# Patient Record
Sex: Female | Born: 1960 | Race: Black or African American | Hispanic: No | Marital: Married | Smoking: Former smoker
Health system: Southern US, Community
[De-identification: ages and names within clinical notes are randomized; demographics above are authoritative.]

## PROBLEM LIST (undated history)

## (undated) DIAGNOSIS — B370 Candidal stomatitis: Secondary | ICD-10-CM

## (undated) DIAGNOSIS — T7840XA Allergy, unspecified, initial encounter: Secondary | ICD-10-CM

## (undated) DIAGNOSIS — E119 Type 2 diabetes mellitus without complications: Secondary | ICD-10-CM

## (undated) DIAGNOSIS — K5090 Crohn's disease, unspecified, without complications: Secondary | ICD-10-CM

## (undated) DIAGNOSIS — R8789 Other abnormal findings in specimens from female genital organs: Secondary | ICD-10-CM

## (undated) DIAGNOSIS — D649 Anemia, unspecified: Secondary | ICD-10-CM

## (undated) DIAGNOSIS — K529 Noninfective gastroenteritis and colitis, unspecified: Secondary | ICD-10-CM

## (undated) HISTORY — PX: COLONOSCOPY: SHX174

## (undated) HISTORY — DX: Anemia, unspecified: D64.9

## (undated) HISTORY — PX: CHOLECYSTECTOMY: SHX55

## (undated) HISTORY — DX: Noninfective gastroenteritis and colitis, unspecified: K52.9

## (undated) HISTORY — DX: Allergy, unspecified, initial encounter: T78.40XA

---

## 1997-06-15 ENCOUNTER — Encounter: Admission: RE | Admit: 1997-06-15 | Discharge: 1997-06-15 | Payer: Self-pay | Admitting: Family Medicine

## 1997-06-27 ENCOUNTER — Encounter: Admission: RE | Admit: 1997-06-27 | Discharge: 1997-06-27 | Payer: Self-pay | Admitting: Family Medicine

## 1997-07-19 ENCOUNTER — Ambulatory Visit (HOSPITAL_COMMUNITY): Admission: RE | Admit: 1997-07-19 | Discharge: 1997-07-19 | Payer: Self-pay | Admitting: *Deleted

## 1997-07-26 ENCOUNTER — Encounter: Admission: RE | Admit: 1997-07-26 | Discharge: 1997-07-26 | Payer: Self-pay | Admitting: Sports Medicine

## 1997-08-16 ENCOUNTER — Encounter: Admission: RE | Admit: 1997-08-16 | Discharge: 1997-08-16 | Payer: Self-pay | Admitting: Family Medicine

## 1997-08-28 ENCOUNTER — Emergency Department (HOSPITAL_COMMUNITY): Admission: EM | Admit: 1997-08-28 | Discharge: 1997-08-28 | Payer: Self-pay | Admitting: Emergency Medicine

## 1997-08-30 ENCOUNTER — Encounter: Admission: RE | Admit: 1997-08-30 | Discharge: 1997-08-30 | Payer: Self-pay | Admitting: Family Medicine

## 1997-09-01 ENCOUNTER — Encounter: Admission: RE | Admit: 1997-09-01 | Discharge: 1997-09-01 | Payer: Self-pay | Admitting: Family Medicine

## 1997-09-29 ENCOUNTER — Encounter: Admission: RE | Admit: 1997-09-29 | Discharge: 1997-09-29 | Payer: Self-pay | Admitting: Family Medicine

## 1997-09-30 ENCOUNTER — Encounter: Admission: RE | Admit: 1997-09-30 | Discharge: 1997-09-30 | Payer: Self-pay | Admitting: Family Medicine

## 1997-10-08 ENCOUNTER — Inpatient Hospital Stay (HOSPITAL_COMMUNITY): Admission: AD | Admit: 1997-10-08 | Discharge: 1997-10-08 | Payer: Self-pay | Admitting: *Deleted

## 1997-10-12 ENCOUNTER — Encounter: Admission: RE | Admit: 1997-10-12 | Discharge: 1998-01-10 | Payer: Self-pay | Admitting: *Deleted

## 1997-10-18 ENCOUNTER — Encounter: Admission: RE | Admit: 1997-10-18 | Discharge: 1997-10-18 | Payer: Self-pay | Admitting: Family Medicine

## 1997-11-14 ENCOUNTER — Encounter: Admission: RE | Admit: 1997-11-14 | Discharge: 1997-11-14 | Payer: Self-pay | Admitting: Family Medicine

## 1997-11-18 ENCOUNTER — Encounter: Admission: RE | Admit: 1997-11-18 | Discharge: 1997-11-18 | Payer: Self-pay | Admitting: Family Medicine

## 1997-11-29 ENCOUNTER — Encounter: Admission: RE | Admit: 1997-11-29 | Discharge: 1997-11-29 | Payer: Self-pay | Admitting: Family Medicine

## 1997-11-29 ENCOUNTER — Ambulatory Visit (HOSPITAL_COMMUNITY): Admission: RE | Admit: 1997-11-29 | Discharge: 1997-11-29 | Payer: Self-pay | Admitting: Obstetrics

## 1997-12-06 ENCOUNTER — Encounter: Admission: RE | Admit: 1997-12-06 | Discharge: 1997-12-06 | Payer: Self-pay | Admitting: Family Medicine

## 1997-12-06 ENCOUNTER — Encounter (HOSPITAL_COMMUNITY): Admission: RE | Admit: 1997-12-06 | Discharge: 1997-12-26 | Payer: Self-pay | Admitting: *Deleted

## 1997-12-08 ENCOUNTER — Inpatient Hospital Stay (HOSPITAL_COMMUNITY): Admission: AD | Admit: 1997-12-08 | Discharge: 1997-12-08 | Payer: Self-pay | Admitting: Obstetrics & Gynecology

## 1997-12-09 ENCOUNTER — Encounter: Admission: RE | Admit: 1997-12-09 | Discharge: 1997-12-09 | Payer: Self-pay | Admitting: Family Medicine

## 1997-12-16 ENCOUNTER — Encounter: Admission: RE | Admit: 1997-12-16 | Discharge: 1997-12-16 | Payer: Self-pay | Admitting: Family Medicine

## 1997-12-20 ENCOUNTER — Encounter: Admission: RE | Admit: 1997-12-20 | Discharge: 1997-12-20 | Payer: Self-pay | Admitting: Family Medicine

## 1997-12-23 ENCOUNTER — Inpatient Hospital Stay (HOSPITAL_COMMUNITY): Admission: AD | Admit: 1997-12-23 | Discharge: 1997-12-27 | Payer: Self-pay | Admitting: Obstetrics & Gynecology

## 1997-12-26 ENCOUNTER — Encounter: Admission: RE | Admit: 1997-12-26 | Discharge: 1997-12-26 | Payer: Self-pay | Admitting: Family Medicine

## 1998-01-12 ENCOUNTER — Encounter: Admission: RE | Admit: 1998-01-12 | Discharge: 1998-01-12 | Payer: Self-pay | Admitting: Family Medicine

## 1998-02-23 ENCOUNTER — Encounter: Admission: RE | Admit: 1998-02-23 | Discharge: 1998-02-23 | Payer: Self-pay | Admitting: Family Medicine

## 1998-03-07 ENCOUNTER — Encounter: Admission: RE | Admit: 1998-03-07 | Discharge: 1998-03-07 | Payer: Self-pay | Admitting: Family Medicine

## 1998-03-28 ENCOUNTER — Encounter: Admission: RE | Admit: 1998-03-28 | Discharge: 1998-03-28 | Payer: Self-pay | Admitting: Family Medicine

## 1998-04-03 ENCOUNTER — Encounter: Admission: RE | Admit: 1998-04-03 | Discharge: 1998-04-03 | Payer: Self-pay | Admitting: Family Medicine

## 1998-07-10 ENCOUNTER — Encounter: Admission: RE | Admit: 1998-07-10 | Discharge: 1998-07-10 | Payer: Self-pay | Admitting: Family Medicine

## 1998-07-13 ENCOUNTER — Encounter: Admission: RE | Admit: 1998-07-13 | Discharge: 1998-07-13 | Payer: Self-pay | Admitting: Family Medicine

## 1998-07-20 ENCOUNTER — Encounter: Admission: RE | Admit: 1998-07-20 | Discharge: 1998-07-20 | Payer: Self-pay | Admitting: Family Medicine

## 1998-10-16 ENCOUNTER — Encounter: Admission: RE | Admit: 1998-10-16 | Discharge: 1998-10-16 | Payer: Self-pay | Admitting: Family Medicine

## 1999-04-02 ENCOUNTER — Encounter: Admission: RE | Admit: 1999-04-02 | Discharge: 1999-04-02 | Payer: Self-pay | Admitting: Family Medicine

## 1999-05-07 ENCOUNTER — Encounter: Admission: RE | Admit: 1999-05-07 | Discharge: 1999-05-07 | Payer: Self-pay | Admitting: Family Medicine

## 2000-05-01 ENCOUNTER — Encounter: Admission: RE | Admit: 2000-05-01 | Discharge: 2000-05-01 | Payer: Self-pay | Admitting: Family Medicine

## 2002-03-17 ENCOUNTER — Encounter: Admission: RE | Admit: 2002-03-17 | Discharge: 2002-03-17 | Payer: Self-pay | Admitting: Family Medicine

## 2002-04-07 ENCOUNTER — Encounter: Payer: Self-pay | Admitting: *Deleted

## 2002-04-07 ENCOUNTER — Encounter: Admission: RE | Admit: 2002-04-07 | Discharge: 2002-04-07 | Payer: Self-pay | Admitting: *Deleted

## 2002-04-20 ENCOUNTER — Encounter: Admission: RE | Admit: 2002-04-20 | Discharge: 2002-04-20 | Payer: Self-pay | Admitting: Sports Medicine

## 2002-11-05 ENCOUNTER — Emergency Department (HOSPITAL_COMMUNITY): Admission: EM | Admit: 2002-11-05 | Discharge: 2002-11-05 | Payer: Self-pay | Admitting: Emergency Medicine

## 2002-11-09 ENCOUNTER — Encounter: Admission: RE | Admit: 2002-11-09 | Discharge: 2002-11-09 | Payer: Self-pay | Admitting: Sports Medicine

## 2003-06-27 ENCOUNTER — Emergency Department (HOSPITAL_COMMUNITY): Admission: EM | Admit: 2003-06-27 | Discharge: 2003-06-28 | Payer: Self-pay | Admitting: Emergency Medicine

## 2004-06-15 ENCOUNTER — Ambulatory Visit: Payer: Self-pay | Admitting: Family Medicine

## 2004-06-15 ENCOUNTER — Encounter (INDEPENDENT_AMBULATORY_CARE_PROVIDER_SITE_OTHER): Payer: Self-pay | Admitting: Specialist

## 2004-08-07 ENCOUNTER — Ambulatory Visit: Payer: Self-pay | Admitting: Family Medicine

## 2004-08-16 ENCOUNTER — Ambulatory Visit: Payer: Self-pay | Admitting: Family Medicine

## 2004-11-25 ENCOUNTER — Emergency Department (HOSPITAL_COMMUNITY): Admission: EM | Admit: 2004-11-25 | Discharge: 2004-11-25 | Payer: Self-pay | Admitting: Emergency Medicine

## 2005-02-01 ENCOUNTER — Ambulatory Visit: Payer: Self-pay | Admitting: Family Medicine

## 2005-08-28 ENCOUNTER — Encounter (INDEPENDENT_AMBULATORY_CARE_PROVIDER_SITE_OTHER): Payer: Self-pay | Admitting: *Deleted

## 2005-09-13 ENCOUNTER — Ambulatory Visit: Payer: Self-pay | Admitting: Family Medicine

## 2005-09-25 ENCOUNTER — Encounter: Admission: RE | Admit: 2005-09-25 | Discharge: 2005-09-25 | Payer: Self-pay | Admitting: Sports Medicine

## 2006-03-18 ENCOUNTER — Ambulatory Visit: Payer: Self-pay | Admitting: Family Medicine

## 2006-03-27 DIAGNOSIS — F329 Major depressive disorder, single episode, unspecified: Secondary | ICD-10-CM

## 2006-03-27 DIAGNOSIS — F411 Generalized anxiety disorder: Secondary | ICD-10-CM | POA: Insufficient documentation

## 2006-03-27 DIAGNOSIS — F17200 Nicotine dependence, unspecified, uncomplicated: Secondary | ICD-10-CM

## 2006-03-28 ENCOUNTER — Encounter (INDEPENDENT_AMBULATORY_CARE_PROVIDER_SITE_OTHER): Payer: Self-pay | Admitting: *Deleted

## 2006-07-03 ENCOUNTER — Telehealth: Payer: Self-pay | Admitting: *Deleted

## 2006-09-18 ENCOUNTER — Encounter (INDEPENDENT_AMBULATORY_CARE_PROVIDER_SITE_OTHER): Payer: Self-pay | Admitting: Family Medicine

## 2006-09-18 ENCOUNTER — Ambulatory Visit: Payer: Self-pay | Admitting: Family Medicine

## 2006-09-18 ENCOUNTER — Encounter: Payer: Self-pay | Admitting: Family Medicine

## 2006-09-18 DIAGNOSIS — F1010 Alcohol abuse, uncomplicated: Secondary | ICD-10-CM

## 2006-09-18 LAB — CONVERTED CEMR LAB
AST: 20 units/L (ref 0–37)
Alkaline Phosphatase: 93 units/L (ref 39–117)
BUN: 13 mg/dL (ref 6–23)
Calcium: 10.6 mg/dL — ABNORMAL HIGH (ref 8.4–10.5)
Chloride: 106 meq/L (ref 96–112)
Creatinine, Ser: 0.81 mg/dL (ref 0.40–1.20)
HCT: 35.7 % — ABNORMAL LOW (ref 36.0–46.0)
Hemoglobin: 10.7 g/dL — ABNORMAL LOW (ref 12.0–15.0)
MCHC: 30 g/dL (ref 30.0–36.0)
MCV: 81 fL (ref 78.0–100.0)
RDW: 18.2 % — ABNORMAL HIGH (ref 11.5–14.0)
Total Bilirubin: 0.4 mg/dL (ref 0.3–1.2)

## 2006-09-22 ENCOUNTER — Encounter: Payer: Self-pay | Admitting: Family Medicine

## 2006-10-16 ENCOUNTER — Encounter: Admission: RE | Admit: 2006-10-16 | Discharge: 2006-10-16 | Payer: Self-pay | Admitting: Sports Medicine

## 2006-10-17 ENCOUNTER — Encounter: Payer: Self-pay | Admitting: Family Medicine

## 2007-02-11 ENCOUNTER — Ambulatory Visit: Payer: Self-pay | Admitting: Family Medicine

## 2007-02-11 ENCOUNTER — Encounter: Payer: Self-pay | Admitting: Family Medicine

## 2007-02-11 DIAGNOSIS — R8789 Other abnormal findings in specimens from female genital organs: Secondary | ICD-10-CM | POA: Insufficient documentation

## 2007-02-11 DIAGNOSIS — D649 Anemia, unspecified: Secondary | ICD-10-CM | POA: Insufficient documentation

## 2007-02-11 HISTORY — DX: Other abnormal findings in specimens from female genital organs: R87.89

## 2007-02-11 LAB — CONVERTED CEMR LAB
Ferritin: 8 ng/mL — ABNORMAL LOW (ref 10–291)
Hemoglobin: 11.7 g/dL — ABNORMAL LOW (ref 12.0–15.0)
RDW: 17.7 % — ABNORMAL HIGH (ref 11.5–15.5)
Saturation Ratios: 3 % — ABNORMAL LOW (ref 20–55)
TIBC: 472 ug/dL — ABNORMAL HIGH (ref 250–470)
Vitamin B-12: 583 pg/mL (ref 211–911)

## 2007-02-16 ENCOUNTER — Encounter: Payer: Self-pay | Admitting: Family Medicine

## 2007-02-16 ENCOUNTER — Telehealth: Payer: Self-pay | Admitting: *Deleted

## 2007-06-23 ENCOUNTER — Ambulatory Visit: Payer: Self-pay | Admitting: Family Medicine

## 2007-06-23 DIAGNOSIS — N898 Other specified noninflammatory disorders of vagina: Secondary | ICD-10-CM | POA: Insufficient documentation

## 2007-06-25 ENCOUNTER — Encounter: Payer: Self-pay | Admitting: Family Medicine

## 2007-09-30 ENCOUNTER — Telehealth: Payer: Self-pay | Admitting: *Deleted

## 2009-02-07 ENCOUNTER — Encounter (INDEPENDENT_AMBULATORY_CARE_PROVIDER_SITE_OTHER): Payer: Self-pay | Admitting: *Deleted

## 2009-02-07 ENCOUNTER — Emergency Department (HOSPITAL_COMMUNITY): Admission: EM | Admit: 2009-02-07 | Discharge: 2009-02-07 | Payer: Self-pay | Admitting: Emergency Medicine

## 2009-02-08 ENCOUNTER — Telehealth: Payer: Self-pay | Admitting: Gastroenterology

## 2009-02-09 ENCOUNTER — Ambulatory Visit: Payer: Self-pay | Admitting: Gastroenterology

## 2009-02-09 ENCOUNTER — Encounter: Payer: Self-pay | Admitting: Gastroenterology

## 2009-02-09 ENCOUNTER — Inpatient Hospital Stay (HOSPITAL_COMMUNITY): Admission: AD | Admit: 2009-02-09 | Discharge: 2009-02-19 | Payer: Self-pay | Admitting: Gastroenterology

## 2009-02-10 ENCOUNTER — Encounter: Payer: Self-pay | Admitting: Internal Medicine

## 2009-02-17 ENCOUNTER — Encounter: Payer: Self-pay | Admitting: Gastroenterology

## 2009-02-20 ENCOUNTER — Telehealth: Payer: Self-pay | Admitting: Gastroenterology

## 2009-02-20 ENCOUNTER — Encounter: Payer: Self-pay | Admitting: Gastroenterology

## 2009-02-21 ENCOUNTER — Telehealth: Payer: Self-pay | Admitting: Gastroenterology

## 2009-02-24 ENCOUNTER — Telehealth: Payer: Self-pay | Admitting: Gastroenterology

## 2009-03-06 ENCOUNTER — Ambulatory Visit: Payer: Self-pay | Admitting: Gastroenterology

## 2009-03-06 ENCOUNTER — Encounter: Payer: Self-pay | Admitting: Gastroenterology

## 2009-03-06 DIAGNOSIS — K5090 Crohn's disease, unspecified, without complications: Secondary | ICD-10-CM | POA: Insufficient documentation

## 2009-03-10 ENCOUNTER — Telehealth: Payer: Self-pay | Admitting: Gastroenterology

## 2009-03-10 ENCOUNTER — Encounter: Payer: Self-pay | Admitting: Gastroenterology

## 2009-04-04 ENCOUNTER — Ambulatory Visit: Payer: Self-pay | Admitting: Gastroenterology

## 2009-04-04 DIAGNOSIS — B373 Candidiasis of vulva and vagina: Secondary | ICD-10-CM

## 2009-04-18 ENCOUNTER — Ambulatory Visit: Payer: Self-pay | Admitting: Gastroenterology

## 2009-04-18 LAB — CONVERTED CEMR LAB
AST: 20 units/L (ref 0–37)
Alkaline Phosphatase: 94 units/L (ref 39–117)
Basophils Absolute: 0 10*3/uL (ref 0.0–0.1)
Bilirubin, Direct: 0 mg/dL (ref 0.0–0.3)
Hemoglobin: 12.3 g/dL (ref 12.0–15.0)
Lymphocytes Relative: 35.4 % (ref 12.0–46.0)
Monocytes Relative: 7 % (ref 3.0–12.0)
Neutro Abs: 5.8 10*3/uL (ref 1.4–7.7)
Platelets: 519 10*3/uL — ABNORMAL HIGH (ref 150.0–400.0)
RDW: 15.3 % — ABNORMAL HIGH (ref 11.5–14.6)
Total Bilirubin: 0.7 mg/dL (ref 0.3–1.2)

## 2009-05-31 ENCOUNTER — Ambulatory Visit: Payer: Self-pay | Admitting: Gastroenterology

## 2009-06-20 ENCOUNTER — Emergency Department (HOSPITAL_COMMUNITY): Admission: EM | Admit: 2009-06-20 | Discharge: 2009-06-20 | Payer: Self-pay | Admitting: Emergency Medicine

## 2009-08-02 ENCOUNTER — Telehealth: Payer: Self-pay | Admitting: Gastroenterology

## 2009-08-04 ENCOUNTER — Ambulatory Visit: Payer: Self-pay | Admitting: Gastroenterology

## 2009-08-04 LAB — CONVERTED CEMR LAB
ALT: 26 units/L (ref 0–35)
Basophils Absolute: 0 10*3/uL (ref 0.0–0.1)
HCT: 42.3 % (ref 36.0–46.0)
Lymphs Abs: 4 10*3/uL (ref 0.7–4.0)
MCHC: 33.7 g/dL (ref 30.0–36.0)
MCV: 91 fL (ref 78.0–100.0)
Monocytes Absolute: 0.7 10*3/uL (ref 0.1–1.0)
Platelets: 453 10*3/uL — ABNORMAL HIGH (ref 150.0–400.0)
RDW: 13.9 % (ref 11.5–14.6)
Total Bilirubin: 1 mg/dL (ref 0.3–1.2)

## 2009-12-20 ENCOUNTER — Telehealth: Payer: Self-pay | Admitting: Gastroenterology

## 2009-12-25 ENCOUNTER — Telehealth: Payer: Self-pay | Admitting: Gastroenterology

## 2010-02-07 ENCOUNTER — Ambulatory Visit
Admission: RE | Admit: 2010-02-07 | Discharge: 2010-02-07 | Payer: Self-pay | Source: Home / Self Care | Attending: Gastroenterology | Admitting: Gastroenterology

## 2010-02-07 ENCOUNTER — Other Ambulatory Visit: Payer: Self-pay | Admitting: Gastroenterology

## 2010-02-07 LAB — URINALYSIS, ROUTINE W REFLEX MICROSCOPIC
Bilirubin Urine: NEGATIVE
Ketones, ur: NEGATIVE
Nitrite: NEGATIVE
Specific Gravity, Urine: >=1.03 (ref 1.000–1.030)
Total Protein, Urine: NEGATIVE
Urine Glucose: NEGATIVE
Urobilinogen, UA: 0.2 (ref 0.0–1.0)
pH: 6 (ref 5.0–8.0)

## 2010-02-20 ENCOUNTER — Telehealth: Payer: Self-pay | Admitting: Gastroenterology

## 2010-02-27 NOTE — Progress Notes (Signed)
Summary: Made office appointment  Phone Note Outgoing Call Call back at Home Phone [phone]   Call placed by: Merri Ray CMA Duncan Dull),  December 25, 2009 8:26 AM Summary of Call: Called to inform pt that refills were sent in to her pharmacy and she has a follow up office appointment with Dr Arlyce Dice on 02/07/2009 at 11am. To follow up for her medications Initial call taken by: Merri Ray CMA Duncan Dull),  December 25, 2009 8:27 AM

## 2010-02-27 NOTE — Procedures (Signed)
Summary: Flexible Sigmoidoscopy  Patient: Elizabeth Andrade Note: All result statuses are Final unless otherwise noted.  Tests: (1) Flexible Sigmoidoscopy (FLX)  FLX Flexible Sigmoidoscopy                             DONE     [facility]     [address]     [place], [place]  [address]           FLEXIBLE SIGMOIDOSCOPY PROCEDURE REPORT           PATIENT:  Elizabeth Andrade, Elizabeth Andrade  MR#:  [id number]     BIRTHDATE:  1961/01/04, 48 yrs. old  GENDER:  female           ENDOSCOPIST:  Wilhemina Bonito. Eda Keys, MD     Referred by:  Barbette Hair. Arlyce Dice, M.D.           PROCEDURE DATE:  02/10/2009     PROCEDURE:  Flexible Sigmoidoscopy, diagnostic     ASA CLASS:  Class II     INDICATIONS:  abnormal imaging           MEDICATIONS:   Fentanyl 100 mcg IV, Versed 10 mg IV           DESCRIPTION OF PROCEDURE:   After the risks benefits and     alternatives of the procedure were thoroughly explained, informed     consent was obtained.  Digital rectal exam was performed and     revealed no abnormalities.   The [id number] ([id number]) endoscope was     introduced through the anus and advanced to the sigmoid colon,     limited by an obstruction.   The quality of the prep was     excellent.  The instrument was then slowly withdrawn as the mucosa     was fully examined.     <<PROCEDUREIMAGES>>           There was stensosis found AT THE DISTAL SIGMOID COLON (EDEMA). THE     DISTAL MUCOSA WAS NORMAL  Retroflexed views in the rectum revealed     no abnormalities.    The scope was then withdrawn from the patient     and the procedure terminated.           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 Eda Keys, MD           CC:  Melvia Heaps, MD           n.     Rosalie Doctor:   Wilhemina Bonito. Eda Keys at 02/10/2009 03:05 PM        Jacques Navy, [id number]  Note: An exclamation mark (!) indicates a result that was not dispersed into the flowsheet. Document Creation Date: 02/10/2009 3:05 PM _______________________________________________________________________  (1) Order result status: Final Collection or observation date-time: 02/10/2009 14:57 Requested date-time:  Receipt date-time:  Reported date-time:  Referring Physician:   Ordering Physician: Fransico Setters [phone]) Specimen Source:  Source: Launa Grill Order Number: [id number] Lab site:

## 2010-02-27 NOTE — Progress Notes (Signed)
  Phone Note Call from Patient   Summary of Call: she called asking for refills on lialda and azathiaprine, both of which were already sent in by office earlier today.  she no showed for last appt and has not been seen by Dr. Arlyce Dice since spring.  I will forward a copy of this to scheduling so that she is set up with ROV in next few weeks. Initial call taken by: Rachael Fee MD,  December 20, 2009 3:50 PM

## 2010-02-27 NOTE — Progress Notes (Signed)
Summary: Release to work  request  Phone Note Call from Patient Call back at [facility] Phone [phone]   Caller: Patient Call For: Dr. Arlyce Dice Reason for Call: Talk to Nurse Summary of Call: would like a note from Dr. Arlyce Dice stating that pt is able to return to work with no restrictions and can return on the 14th... [facility] [employer] fax #[phone] Initial call taken by: Vallarie Mare,  March 10, 2009 2:06 PM  Follow-up for Phone Call        Note faxed per pt. request. I left a message to let her know and to callback if she needs anything else. Follow-up by: Laureen Ochs LPN,  March 10, 2009 4:36 PM

## 2010-02-27 NOTE — Progress Notes (Signed)
Summary: triage  Phone Note Call from Patient Call back at Home Phone [phone]   Caller: Mom Call For: Arlyce Dice Reason for Call: Talk to Nurse Summary of Call: Patient states that she feels that her stomach is swollen inside wants to know if that's part of the healing process Initial call taken by: Tawni Levy,  February 24, 2009 3:49 PM  Follow-up for Phone Call        Pt. states she has a "drawing in mild pain " in her abd. Denies fever, constipation, diarrhea, n/v, blood, black stools.   1) Soft,bland diet. No spicy,greasy,fried foods. Advance diet as tolerated. 2) tylenol/Ibuprofen as needed 3) Heating pad to abdomen as needed. 4) Pt. to keep scheduled office visit on 03-06-09 5) If symptoms become worse call back immediately or go to ER.  Follow-up by: Laureen Ochs LPN,  February 24, 2009 4:43 PM
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
## 2010-02-27 NOTE — Letter (Signed)
Summary: Appt Reminder 2  [facility]  [address] [place], [place] [address]   Phone: [phone]  Fax: [phone]        February 20, 2009 MRN: [id number]    Elizabeth Andrade [address] [place], [place]  [address]    Dear Elizabeth Andrade,   You have a return appointment with Dr.Robert Arlyce Dice on 03-06-09 at 9:30am. Please remember to bring a complete list of the medicines you are taking, your insurance card and your co-pay.  If you have to cancel or reschedule this appointment, please call before 5:00 pm the evening before to avoid a cancellation fee.  If you have any questions or concerns, please call [phone].    Sincerely,    Laureen Ochs LPN  Appended Document: Appt Reminder 2 Letter mailed to patient.
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
## 2010-02-27 NOTE — Progress Notes (Signed)
Summary: TRIAGE  Phone Note Call from Patient Call back at Home Phone [phone]   Caller: Patient Call For: Dr. Arlyce Dice Reason for Call: Talk to Nurse Summary of Call:  Can she take vitamins for Inflamation, with her other medication. Initial call taken by: Karna Christmas,  February 21, 2009 4:47 PM  Follow-up for Phone Call        DR.KAPLAN--Per pt. mother-Can pt. take an OTC  supplement called Zyflamend?   Follow-up by: Laureen Ochs LPN,  February 21, 2009 4:57 PM  Additional Follow-up for Phone Call Additional follow up Details #1::        ok Additional Follow-up by: Louis Meckel MD,  February 22, 2009 10:02 AM    Additional Follow-up for Phone Call Additional follow up Details #2::    Above MD orders reviewed with patient's mother. Pt. to keep scheduled office visit. Pt. instructed to call back as needed.   Follow-up by: Laureen Ochs LPN,  February 22, 2009 10:44 AM
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
## 2010-02-27 NOTE — Progress Notes (Signed)
Summary: triage  Phone Note Call from Patient Call back at [phone] or [phone]   Caller: Patient Call For: Arlyce Dice Reason for Call: Talk to Nurse Summary of Call: Patient was seen last night in the E.R and was told to see a GI doc this week because she was dx with Crohn's. Pt wantsto see Dr Arlyce Dice advised her that his firt available is in Feb. but she wants to see him today or tom. Initial call taken by: Tawni Levy,  February 08, 2009 10:23 AM  Follow-up for Phone Call        Pt. will see Dr.Kaplan on 02-09-09 at 8:30am.  Follow-up by: Laureen Ochs LPN,  February 08, 2009 10:56 AM

## 2010-02-27 NOTE — Letter (Signed)
Summary: Generic Letter  [facility]  [address] [place], [place] [address]   Phone: [phone]  Fax: [phone]    02/09/2009  Elizabeth Andrade [address] [place], [place]  [address]  Dear [name],  It is my pleasure to have treated you recently as a new patient in my office. I appreciate your confidence and the opportunity to participate in your care.  Since I do have a busy inpatient endoscopy schedule and office schedule, my office hours vary weekly. I am, however, available for emergency calls everyday through my office. If I am not available for an urgent office appointment, another one of our gastroenterologist will be able to assist you.  My well-trained staff are prepared to help you at all times. For emergencies after office hours, a physician from our Gastroenterology section is always available through my 24 hour answering service  Once again I welcome you as a new patient and I look forward to a happy and healthy relationship               Sincerely,   Melvia Heaps MD  Appended Document: Generic Letter letter mailed

## 2010-02-27 NOTE — Procedures (Signed)
Summary: Colonoscopy  Patient: Elizabeth Andrade Note: All result statuses are Final unless otherwise noted.  Tests: (1) Colonoscopy (COL)   COL Colonoscopy           DONE      [facility]     [address]     [place], [place]  [address]           COLONOSCOPY PROCEDURE REPORT           PATIENT:  [name], [name]  MR#:  [id number]     BIRTHDATE:  Jan 18, 1961, 48 yrs. old  GENDER:  female           ENDOSCOPIST:  Barbette Hair. Arlyce Dice, MD     Referred by:           PROCEDURE DATE:  02/17/2009     PROCEDURE:  Colonoscopy with biopsy     ASA CLASS:  Class II     INDICATIONS:  Crohn's disease           MEDICATIONS:   Fentanyl 75 mcg IV, Versed 8 mg IV, Benadryl 25 mg     IV           DESCRIPTION OF PROCEDURE:   After the risks benefits and     alternatives of the procedure were thoroughly explained, informed     consent was obtained.  Digital rectal exam was performed and     revealed no abnormalities.   The EC-[id number] ([id number]) and [id number]     ([id number]) endoscope was introduced through the anus and advanced     to the ascending colon, solid stool in right colon  The quality of     the prep was .  The instrument was then slowly withdrawn as the     colon was fully examined.     <<PROCEDUREIMAGES>>           FINDINGS:  Colitis was found in the sigmoid colon. 15cm segment of     severe erythema and lumenal narrowing beginning at 25cm from anus     and extending proximally. Area was traversed with 12mm scope with     mild resistance. Bxs taken (see image010 and image011).     Diverticula were found in the sigmoid colon (see image010).     Moderate diverticulosis in midportion of segental colitis     extending into descending colon  This was otherwise a normal     examination of the colon (see image002, image003, image004,     image005, image007, and image008).   Retroflexed views in the     rectum revealed Unable to retroflex.    The scope was then     withdrawn from the  patient and the procedure completed.           COMPLICATIONS:  None           ENDOSCOPIC IMPRESSION:     1) Segmental Colitis in the sigmoid colon     2) Diverticula in the sigmoid colon     3) Otherwise normal examination           Findings c/w segmental colitis, most likely due to IBD.  Colitis     secondary to diverticulitis is less likely.           RECOMMENDATIONS:           REPEAT EXAM:  No           ______________________________     Barbette Hair. Arlyce Dice, MD  CC:  Ancil Boozer MD           n.     Rosalie DoctorBarbette Hair. Vonte Rossin at 02/17/2009 12:12 PM           Jacques Navy, [id number]  Note: An exclamation mark (!) indicates a result that was not dispersed into the flowsheet. Document Creation Date: 02/17/2009 12:11 PM _______________________________________________________________________  (1) Order result status: Final Collection or observation date-time: 02/17/2009 12:06 Requested date-time:  Receipt date-time:  Reported date-time:  Referring Physician:   Ordering Physician: Melvia Heaps [phone]) Specimen Source:  Source: Launa Grill Order Number: [id number] Lab site:

## 2010-02-27 NOTE — Progress Notes (Signed)
Summary: reminder call for labs  Phone Note Outgoing Call Call back at [facility] Phone [phone]   Summary of Call: Reminder call for pt to come in for labs before her office appointment on Friday.Lab orders already in [facility] Initial call taken by: Merri Ray CMA Duncan Dull),  August 02, 2009 8:24 AM

## 2010-02-27 NOTE — Progress Notes (Signed)
Summary: 2wk hosp fu  Phone Note Call from Patient Call back at Home Phone [phone] Call back at if no ans-leave appt day & date on machine.   Call For: Dr Arlyce Dice Summary of Call: Just discharged from hospital and Dr Arlyce Dice told her she must be seen in 2 wk even if his schedule is full. Initial call taken by: Leanor Kail [facility],  February 20, 2009 9:09 AM  Follow-up for Phone Call        Pt. will see Dr.Rani Idler on 03-06-09 at 9:30am. Pt. instructed to call back as needed.  Follow-up by: Laureen Ochs LPN,  February 20, 2009 9:29 AM

## 2010-02-27 NOTE — Letter (Signed)
Summary: Out of Work  [facility]  [address] [place], [place] [address]   Phone: [phone]  Fax: [phone]    March 10, 2009   Employee:  ARAYA ROEL    To Whom It May Concern:  Please allow the above named employee to return to work on 03-13-09 with no restrictions.  If you need additional information, please feel free to contact our office.         Sincerely,        Barbette Hair.Marchele Decock,MD    Laureen Ochs LPN  Appended Document: Out of Work Faxed to [phone], per pt. request.

## 2010-03-01 NOTE — Progress Notes (Signed)
Summary: Triage  Phone Note Call from Patient Call back at Home Phone [phone] Call back at [phone]   Caller: Patient Call For: Dr. Arlyce Dice Reason for Call: Talk to Nurse Summary of Call: Pt has questions for a nurse Initial call taken by: [place] Johnson,  February 20, 2010 10:54 AM  Follow-up for Phone Call        Patient states that when Dr. Arlyce Dice told her she didn't have to come back for she ate fried food 3 days in a row and she hasn't been right since. She was out of her Lialda and Azathioprine for several days but got more on Sunday and has started taking them again. States she had 4 BM's on Saturday but has not had one since then. States she is passing "oil." Patient thinks she is constipated and wants to know if she can use a Fleets enema. Dr. Arlyce Dice please advise. Follow-up by: Selinda Michaels RN,  February 20, 2010 1:17 PM  Additional Follow-up for Phone Call Additional follow up Details #1::        She should not take any enemas now.  She should continue her current medications.  Call back in 2 days if she still not feeling well. Additional Follow-up by: Louis Meckel MD,  February 20, 2010 3:39 PM    Additional Follow-up for Phone Call Additional follow up Details #2::    Spoke with patient and let he know Dr. Marzetta Board recommendations. Patient to call us back in a couple of days to let us know how she is doing. Patient verbalized understanding. Follow-up by: Selinda Michaels RN,  February 20, 2010 3:43 PM
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
## 2010-03-29 ENCOUNTER — Encounter: Payer: Self-pay | Admitting: Gastroenterology

## 2010-04-11 ENCOUNTER — Telehealth: Payer: Self-pay | Admitting: Gastroenterology

## 2010-04-15 LAB — CBC
Hemoglobin: 11.7 g/dL — ABNORMAL LOW (ref 12.0–15.0)
Hemoglobin: 15.6 g/dL — ABNORMAL HIGH (ref 12.0–15.0)
MCHC: 33.7 g/dL (ref 30.0–36.0)
MCHC: 33.8 g/dL (ref 30.0–36.0)
MCHC: 33.9 g/dL (ref 30.0–36.0)
MCV: 93.1 fL (ref 78.0–100.0)
MCV: 93.6 fL (ref 78.0–100.0)
Platelets: 552 10*3/uL — ABNORMAL HIGH (ref 150–400)
Platelets: 562 10*3/uL — ABNORMAL HIGH (ref 150–400)
RBC: 3.69 MIL/uL — ABNORMAL LOW (ref 3.87–5.11)
RBC: 4.98 MIL/uL (ref 3.87–5.11)
RDW: 13.7 % (ref 11.5–15.5)
WBC: 11.1 10*3/uL — ABNORMAL HIGH (ref 4.0–10.5)
WBC: 33.7 10*3/uL — ABNORMAL HIGH (ref 4.0–10.5)

## 2010-04-15 LAB — DIFFERENTIAL
Basophils Relative: 0 % (ref 0–1)
Basophils Relative: 0 % (ref 0–1)
Eosinophils Relative: 0 % (ref 0–5)
Lymphs Abs: 1.4 10*3/uL (ref 0.7–4.0)
Lymphs Abs: 1.7 10*3/uL (ref 0.7–4.0)
Monocytes Absolute: 0.2 10*3/uL (ref 0.1–1.0)
Monocytes Absolute: 0.7 10*3/uL (ref 0.1–1.0)
Monocytes Relative: 2 % — ABNORMAL LOW (ref 3–12)
Monocytes Relative: 2 % — ABNORMAL LOW (ref 3–12)
Neutro Abs: 9.4 10*3/uL — ABNORMAL HIGH (ref 1.7–7.7)
Neutrophils Relative %: 93 % — ABNORMAL HIGH (ref 43–77)

## 2010-04-15 LAB — URINALYSIS, ROUTINE W REFLEX MICROSCOPIC
Glucose, UA: NEGATIVE mg/dL
Protein, ur: NEGATIVE mg/dL
Specific Gravity, Urine: 1.027 (ref 1.005–1.030)
pH: 5.5 (ref 5.0–8.0)

## 2010-04-15 LAB — COMPREHENSIVE METABOLIC PANEL
ALT: 31 U/L (ref 0–35)
AST: 18 U/L (ref 0–37)
Calcium: 10.8 mg/dL — ABNORMAL HIGH (ref 8.4–10.5)
Creatinine, Ser: 0.9 mg/dL (ref 0.4–1.2)
GFR calc Af Amer: >60 mL/min (ref >60)
GFR calc non Af Amer: >60 mL/min (ref >60)
Sodium: 137 mEq/L (ref 135–145)
Total Protein: 8.3 g/dL (ref 6.0–8.3)

## 2010-04-15 LAB — BASIC METABOLIC PANEL
BUN: 10 mg/dL (ref 6–23)
CO2: 22 mEq/L (ref 19–32)
CO2: 28 mEq/L (ref 19–32)
Calcium: 10.1 mg/dL (ref 8.4–10.5)
Chloride: 102 mEq/L (ref 96–112)
Chloride: 103 mEq/L (ref 96–112)
GFR calc Af Amer: >60 mL/min (ref >60)
Glucose, Bld: 128 mg/dL — ABNORMAL HIGH (ref 70–99)
Potassium: 4 mEq/L (ref 3.5–5.1)
Sodium: 136 mEq/L (ref 135–145)
Sodium: 137 mEq/L (ref 135–145)

## 2010-04-15 LAB — URINALYSIS, MICROSCOPIC ONLY
Nitrite: POSITIVE — AB
Specific Gravity, Urine: >1.046 — ABNORMAL HIGH (ref 1.005–1.030)
Urobilinogen, UA: 4 mg/dL — ABNORMAL HIGH (ref 0.0–1.0)
pH: 5 (ref 5.0–8.0)

## 2010-04-15 LAB — URINE MICROSCOPIC-ADD ON

## 2010-04-15 LAB — SEDIMENTATION RATE: Sed Rate: 96 mm/hr — ABNORMAL HIGH (ref 0–22)

## 2010-04-15 LAB — HEPATIC FUNCTION PANEL
AST: 13 U/L (ref 0–37)
Albumin: 2.3 g/dL — ABNORMAL LOW (ref 3.5–5.2)
Bilirubin, Direct: 0.3 mg/dL (ref 0.0–0.3)

## 2010-04-15 LAB — GAMMA GT: GGT: 131 U/L — ABNORMAL HIGH (ref 7–51)

## 2010-04-16 ENCOUNTER — Encounter (INDEPENDENT_AMBULATORY_CARE_PROVIDER_SITE_OTHER): Payer: Self-pay | Admitting: *Deleted

## 2010-04-16 LAB — COMPREHENSIVE METABOLIC PANEL
ALT: 86 U/L — ABNORMAL HIGH (ref 0–35)
AST: 50 U/L — ABNORMAL HIGH (ref 0–37)
Alkaline Phosphatase: 253 U/L — ABNORMAL HIGH (ref 39–117)
BUN: 6 mg/dL (ref 6–23)
CO2: 29 mEq/L (ref 19–32)
CO2: 29 mEq/L (ref 19–32)
Calcium: 10.4 mg/dL (ref 8.4–10.5)
Calcium: 9.5 mg/dL (ref 8.4–10.5)
Creatinine, Ser: 0.72 mg/dL (ref 0.4–1.2)
GFR calc Af Amer: >60 mL/min (ref >60)
GFR calc Af Amer: >60 mL/min (ref >60)
GFR calc non Af Amer: >60 mL/min (ref >60)
GFR calc non Af Amer: >60 mL/min (ref >60)
Glucose, Bld: 98 mg/dL (ref 70–99)
Potassium: 4.1 mEq/L (ref 3.5–5.1)
Sodium: 136 mEq/L (ref 135–145)

## 2010-04-16 LAB — CBC
Hemoglobin: 12.6 g/dL (ref 12.0–15.0)
Hemoglobin: 13.2 g/dL (ref 12.0–15.0)
Hemoglobin: 13.5 g/dL (ref 12.0–15.0)
MCHC: 33.5 g/dL (ref 30.0–36.0)
MCHC: 33.8 g/dL (ref 30.0–36.0)
MCHC: 33.9 g/dL (ref 30.0–36.0)
MCV: 92.4 fL (ref 78.0–100.0)
Platelets: 763 10*3/uL — ABNORMAL HIGH (ref 150–400)
RBC: 4.04 MIL/uL (ref 3.87–5.11)
RBC: 4.22 MIL/uL (ref 3.87–5.11)
RDW: 14.5 % (ref 11.5–15.5)
WBC: 26.7 10*3/uL — ABNORMAL HIGH (ref 4.0–10.5)

## 2010-04-16 LAB — BASIC METABOLIC PANEL
BUN: 8 mg/dL (ref 6–23)
CO2: 29 mEq/L (ref 19–32)
Calcium: 10.4 mg/dL (ref 8.4–10.5)
Creatinine, Ser: 0.73 mg/dL (ref 0.4–1.2)
GFR calc Af Amer: >60 mL/min (ref >60)
GFR calc non Af Amer: >60 mL/min (ref >60)
Glucose, Bld: 81 mg/dL (ref 70–99)

## 2010-04-17 ENCOUNTER — Other Ambulatory Visit: Payer: Self-pay

## 2010-04-26 ENCOUNTER — Telehealth: Payer: Self-pay | Admitting: Gastroenterology

## 2010-04-26 NOTE — Telephone Encounter (Signed)
Patient aware that she may go by the lab M-F from 8-5 to have her blood drawn. The orders are already in from the missed appt date.

## 2010-04-26 NOTE — Progress Notes (Signed)
Summary: Pt needs labs for March  ---- Converted from flag ---- ---- 04/03/2010 12:10 PM, Harlow Mares CMA (AAMA) wrote: I would have wrote it on the office sheet for that day.   ---- 04/03/2010 12:07 PM, Merri Ray CMA (AAMA) wrote: Help me out, You sent me this flag, but in his office note he said labs in 6 months. That would make her due in july right??  ---- 02/07/2010 11:26 AM, Harlow Mares CMA (AAMA) wrote: pt needs CBC and LFTS ------------------------------  Phone Note Outgoing Call   Call placed by: Merri Ray CMA Duncan Dull),  April 11, 2010 11:50 AM Summary of Call: Call pt to inform labs are needed for March Initial call taken by: Merri Ray CMA Duncan Dull),  April 11, 2010 11:50 AM  Follow-up for Phone Call        Called and left message for pt to return call so we could schedule her lab appointment for March Follow-up by: Merri Ray CMA Duncan Dull),  April 12, 2010 10:22 AM  Additional Follow-up for Phone Call Additional follow up Details #1::        Will send out reminder letter today Additional Follow-up by: Merri Ray CMA Duncan Dull),  April 16, 2010 8:36 AM

## 2010-04-26 NOTE — Letter (Signed)
Summary: Appointment Reminder  [facility]  [address] [place], [place] [address]   Phone: [phone]  Fax: [phone]        April 16, 2010 MRN: [id number]    [facility] [address], [address] [place], [place]  [address]    Dear Ms. GAMBOA,    This is a reminder letter that your follow up labs are due for March.   Please arrive at [address] in our basement to have these labs   drawn.   If you have any questions please contact Robin at [phone].         Sincerely,    Merri Ray CMA (AAMA)  Appended Document: Appointment Reminder SCHEDULED IN EPIC

## 2010-05-08 ENCOUNTER — Other Ambulatory Visit: Payer: Self-pay | Admitting: Gastroenterology

## 2010-05-15 ENCOUNTER — Other Ambulatory Visit: Payer: Self-pay | Admitting: Gastroenterology

## 2010-05-16 ENCOUNTER — Telehealth: Payer: Self-pay | Admitting: Gastroenterology

## 2010-05-16 NOTE — Telephone Encounter (Signed)
Tried to call mobile number but it would not go through. Called pts home number and left message for her to call back.

## 2010-05-16 NOTE — Telephone Encounter (Signed)
Pt calling wanting a prescription for her vaginal yeast infection. Instructed pt to call her PCP or her GYN doctor. Pt verbalized understanding.

## 2010-06-05 ENCOUNTER — Other Ambulatory Visit: Payer: Self-pay | Admitting: *Deleted

## 2010-06-05 ENCOUNTER — Other Ambulatory Visit (INDEPENDENT_AMBULATORY_CARE_PROVIDER_SITE_OTHER): Payer: Self-pay

## 2010-06-05 DIAGNOSIS — K5090 Crohn's disease, unspecified, without complications: Secondary | ICD-10-CM

## 2010-06-05 LAB — CBC WITH DIFFERENTIAL/PLATELET
Basophils Absolute: 0 10*3/uL (ref 0.0–0.1)
HCT: 39.5 % (ref 36.0–46.0)
Lymphs Abs: 3.4 10*3/uL (ref 0.7–4.0)
MCV: 92 fl (ref 78.0–100.0)
Monocytes Absolute: 0.6 10*3/uL (ref 0.1–1.0)
Neutrophils Relative %: 41.5 % — ABNORMAL LOW (ref 43.0–77.0)
Platelets: 399 10*3/uL (ref 150.0–400.0)
RDW: 14.1 % (ref 11.5–14.6)

## 2010-06-05 LAB — HEPATIC FUNCTION PANEL: Total Bilirubin: 0.6 mg/dL (ref 0.3–1.2)

## 2010-06-06 ENCOUNTER — Telehealth: Payer: Self-pay

## 2010-06-06 NOTE — Telephone Encounter (Signed)
Left message to call back  

## 2010-06-06 NOTE — Progress Notes (Signed)
Quick Note:  Please inform the patient that lab work was normal and to continue current plan of action ______ 

## 2010-06-06 NOTE — Telephone Encounter (Signed)
Message copied by Chrystie Nose on Wed Jun 06, 2010  4:03 PM ------      Message from: Melvia Heaps MD      Created: Wed Jun 06, 2010  1:42 PM       Please inform the patient that lab work was normal and to continue current plan of action

## 2010-06-07 NOTE — Telephone Encounter (Signed)
Pt aware of results and Dr. Marzetta Board recommendations.

## 2010-10-04 ENCOUNTER — Other Ambulatory Visit: Payer: Self-pay | Admitting: Gastroenterology

## 2010-10-08 ENCOUNTER — Other Ambulatory Visit: Payer: Self-pay | Admitting: Gastroenterology

## 2010-11-06 ENCOUNTER — Encounter: Payer: Self-pay | Admitting: Internal Medicine

## 2010-11-06 ENCOUNTER — Ambulatory Visit: Payer: Self-pay | Admitting: Gastroenterology

## 2010-11-06 NOTE — Telephone Encounter (Signed)
Left message for pt to call back  °

## 2010-11-06 NOTE — Telephone Encounter (Signed)
Error - wrong pt

## 2010-12-14 ENCOUNTER — Encounter: Payer: Self-pay | Admitting: Gastroenterology

## 2010-12-14 ENCOUNTER — Ambulatory Visit (INDEPENDENT_AMBULATORY_CARE_PROVIDER_SITE_OTHER): Payer: BC Managed Care – PPO | Admitting: Gastroenterology

## 2010-12-14 ENCOUNTER — Other Ambulatory Visit (INDEPENDENT_AMBULATORY_CARE_PROVIDER_SITE_OTHER): Payer: BC Managed Care – PPO

## 2010-12-14 VITALS — BP 132/82 | HR 77 | Ht 66.0 in

## 2010-12-14 DIAGNOSIS — K5289 Other specified noninfective gastroenteritis and colitis: Secondary | ICD-10-CM

## 2010-12-14 DIAGNOSIS — K529 Noninfective gastroenteritis and colitis, unspecified: Secondary | ICD-10-CM

## 2010-12-14 DIAGNOSIS — K5090 Crohn's disease, unspecified, without complications: Secondary | ICD-10-CM

## 2010-12-14 LAB — HEPATIC FUNCTION PANEL
ALT: 21 U/L (ref 0–35)
Bilirubin, Direct: 0.1 mg/dL (ref 0.0–0.3)
Total Protein: 8.1 g/dL (ref 6.0–8.3)

## 2010-12-14 LAB — CBC WITH DIFFERENTIAL/PLATELET
Basophils Relative: 0.7 % (ref 0.0–3.0)
Eosinophils Relative: 1.5 % (ref 0.0–5.0)
HCT: 39.7 % (ref 36.0–46.0)
Hemoglobin: 13.1 g/dL (ref 12.0–15.0)
Lymphs Abs: 3.5 10*3/uL (ref 0.7–4.0)
MCV: 93.8 fl (ref 78.0–100.0)
Monocytes Absolute: 0.5 10*3/uL (ref 0.1–1.0)
Neutro Abs: 3.3 10*3/uL (ref 1.4–7.7)
RBC: 4.23 Mil/uL (ref 3.87–5.11)
WBC: 7.6 10*3/uL (ref 4.5–10.5)

## 2010-12-14 NOTE — Progress Notes (Signed)
History of Present Illness:  Mrs. Elizabeth Andrade has returned for her annual checkup of Crohn's disease. She has Crohn's colitis and enteritis. On a regimen of azathioprine and lialda she remains in clinical remission. She has no GI complaints.    Review of Systems: Pertinent positive and negative review of systems were noted in the above HPI section. All other review of systems were otherwise negative.    Current Medications, Allergies, Past Medical History, Past Surgical History, Family History and Social History were reviewed in [employer] [facility] electronic medical record  Vital signs were reviewed in today's medical record. Physical Exam: General: Well developed , well nourished, no acute distress Head: Normocephalic and atraumatic Eyes:  sclerae anicteric, EOMI Ears: Normal auditory acuity Mouth: No deformity or lesions Lungs: Clear throughout to auscultation Heart: Regular rate and rhythm; no murmurs, rubs or bruits Abdomen: Soft, non tender and non distended. No masses, hepatosplenomegaly or hernias noted. Normal Bowel sounds Rectal:deferred Musculoskeletal: Symmetrical with no gross deformities  Pulses:  Normal pulses noted Extremities: No clubbing, cyanosis, edema or deformities noted Neurological: Alert oriented x 4, grossly nonfocal Psychological:  Alert and cooperative. Normal mood and affect

## 2010-12-14 NOTE — Patient Instructions (Signed)
You will go to the basement for labs today You will follow up with labs again in 6 months And follow up with Dr Arlyce Dice in 1 year

## 2010-12-14 NOTE — Assessment & Plan Note (Signed)
Patient remains in clinical remission on lialda and azathioprine.  Medications #1 continue current medications #2 check CBC and LFTs every 6 months

## 2011-02-27 ENCOUNTER — Other Ambulatory Visit: Payer: Self-pay | Admitting: Gastroenterology

## 2011-03-18 ENCOUNTER — Other Ambulatory Visit: Payer: Self-pay | Admitting: Gastroenterology

## 2011-09-17 ENCOUNTER — Other Ambulatory Visit: Payer: Self-pay | Admitting: Gastroenterology

## 2011-10-22 ENCOUNTER — Other Ambulatory Visit: Payer: Self-pay | Admitting: Gastroenterology

## 2012-02-27 ENCOUNTER — Encounter (HOSPITAL_COMMUNITY): Payer: Self-pay

## 2012-02-27 ENCOUNTER — Emergency Department (HOSPITAL_COMMUNITY)
Admission: EM | Admit: 2012-02-27 | Discharge: 2012-02-27 | Disposition: A | Discharge status: Home / Self Care | Payer: BC Managed Care – PPO | Attending: Emergency Medicine | Admitting: Emergency Medicine

## 2012-02-27 DIAGNOSIS — K5900 Constipation, unspecified: Secondary | ICD-10-CM | POA: Insufficient documentation

## 2012-02-27 DIAGNOSIS — R6889 Other general symptoms and signs: Secondary | ICD-10-CM | POA: Insufficient documentation

## 2012-02-27 DIAGNOSIS — R51 Headache: Secondary | ICD-10-CM | POA: Insufficient documentation

## 2012-02-27 DIAGNOSIS — D649 Anemia, unspecified: Secondary | ICD-10-CM | POA: Insufficient documentation

## 2012-02-27 DIAGNOSIS — R52 Pain, unspecified: Secondary | ICD-10-CM | POA: Insufficient documentation

## 2012-02-27 DIAGNOSIS — K5090 Crohn's disease, unspecified, without complications: Secondary | ICD-10-CM | POA: Insufficient documentation

## 2012-02-27 DIAGNOSIS — R197 Diarrhea, unspecified: Secondary | ICD-10-CM | POA: Insufficient documentation

## 2012-02-27 DIAGNOSIS — R509 Fever, unspecified: Secondary | ICD-10-CM | POA: Insufficient documentation

## 2012-02-27 DIAGNOSIS — K5289 Other specified noninfective gastroenteritis and colitis: Secondary | ICD-10-CM | POA: Insufficient documentation

## 2012-02-27 DIAGNOSIS — J029 Acute pharyngitis, unspecified: Secondary | ICD-10-CM | POA: Insufficient documentation

## 2012-02-27 DIAGNOSIS — F17200 Nicotine dependence, unspecified, uncomplicated: Secondary | ICD-10-CM | POA: Insufficient documentation

## 2012-02-27 DIAGNOSIS — Z79899 Other long term (current) drug therapy: Secondary | ICD-10-CM | POA: Insufficient documentation

## 2012-02-27 HISTORY — DX: Crohn's disease, unspecified, without complications: K50.90

## 2012-02-27 LAB — GLUCOSE, CAPILLARY: Glucose-Capillary: 139 mg/dL — ABNORMAL HIGH (ref 70–99)

## 2012-02-27 MED ORDER — OSELTAMIVIR PHOSPHATE 75 MG PO CAPS: 75.0000 mg | ORAL_CAPSULE | Freq: Two times a day (BID) | ORAL | Status: DC

## 2012-02-27 NOTE — ED Notes (Addendum)
Pt presents with 2 day h/o frontal headache.  Pt taking tylenol that gives no relief.  +blurred vision, pt denies any nausea or photophobia.  Pt also reports 2 day h/o mid back pain - denies any injury.

## 2012-02-27 NOTE — ED Notes (Signed)
Pt placed in gown and monitored by b/p and pulse ox

## 2012-02-27 NOTE — ED Provider Notes (Signed)
History     CSN: [id number]  Arrival date & time 02/27/12  1208   First MD Initiated Contact with Patient 02/27/12 1302      No chief complaint on file.   (Consider location/radiation/quality/duration/timing/severity/associated sxs/prior treatment) HPI Comments: This is a 52 year old female, past medical history remarkable for Crohn's disease, who presents emergency department with chief complaint of flulike illness. Patient states that she has had a headache, runny nose, sore throat, low-grade fever, and generalized body aches for the past 2 days. Contrary to the triage notes, she has not had blurred vision. She denies chest pain, shortness of breath, nausea, vomiting, numbness tingling of the extremities. She endorses diarrhea and constipation, but this is chronic for her. Patient states that her level of discomfort is mild. She is tried taking Tylenol with some relief. There are no aggravating or alleviating factors.  The history is provided by the patient. No language interpreter was used.    Past Medical History  Diagnosis Date  . Anemia   . Colitis   . Crohn's disease     Past Surgical History  Procedure Date  . Cholecystectomy     Family History  Problem Relation Age of Onset  . Colon cancer Maternal Grandmother   . Breast cancer      aunt  . Depression    . Depression Brother     committed suicide in 1980    History  Substance Use Topics  . Smoking status: Current Every Day Smoker  . Smokeless tobacco: Never Used  . Alcohol Use: No    OB History    Grav Para Term Preterm Abortions TAB SAB Ect Mult Living                  Review of Systems  All other systems reviewed and are negative.    Allergies  Review of patient's allergies indicates no known allergies.  Home Medications   Current Outpatient Rx  Name  Route  Sig  Dispense  Refill  . ACETAMINOPHEN 500 MG PO TABS   Oral   Take 1,000 mg by mouth every 6 (six) hours as needed. For pain          . AZATHIOPRINE 50 MG PO TABS      take 1 tablet by mouth once daily   30 tablet   3   . VITAMIN D 1000 UNITS PO TABS   Oral   Take 5,000 Units by mouth daily.          Marland Kitchen FERROUS SULFATE 325 (65 FE) MG PO TABS   Oral   Take 325 mg by mouth daily with breakfast.           . MESALAMINE 1.2 G PO TBEC   Oral   Take 1,200 mg by mouth daily with breakfast.         . ONE-DAILY MULTI VITAMINS PO TABS   Oral   Take 1 tablet by mouth daily.           . OMEGA-3-ACID ETHYL ESTERS 1 G PO CAPS   Oral   Take 1 g by mouth daily.           BP 108/93  Pulse 92  Temp 99 F (37.2 C) (Oral)  Resp 20  SpO2 97%  Physical Exam  Nursing note and vitals reviewed. Constitutional: She is oriented to person, place, and time. She appears well-developed and well-nourished.  HENT:  Head: Normocephalic and atraumatic.  Mildly red and inflamed oropharynx, without tonsillar exudates, no signs abscesses, mild tenderness to palpation of frontal sinuses and maxillary sinuses, tympanic membranes are clear bilaterally  Eyes: Conjunctivae normal and EOM are normal. Pupils are equal, round, and reactive to light.  Neck: Normal range of motion. Neck supple.  Cardiovascular: Normal rate and regular rhythm.  Exam reveals no gallop and no friction rub.   No murmur heard. Pulmonary/Chest: Effort normal and breath sounds normal. No respiratory distress. She has no wheezes. She has no rales. She exhibits no tenderness.  Abdominal: Soft. Bowel sounds are normal. She exhibits no distension and no mass. There is no tenderness. There is no rebound and no guarding.  Musculoskeletal: Normal range of motion. She exhibits no edema and no tenderness.       No bony tenderness over the spine, paraspinal muscles are mildly tender to palpation.  Neurological: She is alert and oriented to person, place, and time.  Skin: Skin is warm and dry.  Psychiatric: She has a normal mood and affect. Her behavior is  normal. Judgment and thought content normal.    ED Course  Procedures (including critical care time)  Labs Reviewed - No data to display No results found.   1. Flu-like symptoms       MDM  52 year old female with flulike illness. Patient started feeling ill yesterday. She has been controlling her symptoms with Tylenol. Its that she wanted to see if there is anything else that she should be doing. Doubt any serious underlying pathology. Patient is not having any chest pain or shortness of breath. Return precautions have been given. Will give the patient Tamiflu per her request, will discharge the patient to home with primary care followup. Patient understands and agrees with the plan. She is stable and ready for discharge.        Roxy Horseman, PA-C 02/27/12 1338

## 2012-02-27 NOTE — ED Provider Notes (Signed)
Medical screening examination/treatment/procedure(s) were performed by non-physician practitioner and as supervising physician I was immediately available for consultation/collaboration.   Loren Racer, MD 02/27/12 [phone]

## 2012-02-27 NOTE — ED Notes (Signed)
Went to triage to get patient placed patient in room, now patient is waiting for doctor

## 2013-04-12 ENCOUNTER — Telehealth: Payer: Self-pay | Admitting: Gastroenterology

## 2013-04-12 MED ORDER — MESALAMINE 1.2 G PO TBEC
1200.0000 mg | DELAYED_RELEASE_TABLET | Freq: Every day | ORAL | Status: DC
Start: 2013-04-12 — End: 2014-04-19

## 2013-04-12 NOTE — Telephone Encounter (Signed)
L/m for pt that med was sent 

## 2013-04-14 ENCOUNTER — Other Ambulatory Visit: Payer: Self-pay | Admitting: *Deleted

## 2013-04-14 MED ORDER — AZATHIOPRINE 50 MG PO TABS: ORAL_TABLET | ORAL | Status: DC

## 2013-05-21 ENCOUNTER — Ambulatory Visit (INDEPENDENT_AMBULATORY_CARE_PROVIDER_SITE_OTHER): Payer: BC Managed Care – PPO | Admitting: Gastroenterology

## 2013-05-21 ENCOUNTER — Other Ambulatory Visit (INDEPENDENT_AMBULATORY_CARE_PROVIDER_SITE_OTHER): Payer: BC Managed Care – PPO

## 2013-05-21 ENCOUNTER — Encounter: Payer: Self-pay | Admitting: Gastroenterology

## 2013-05-21 VITALS — BP 118/72 | HR 64 | Ht 66.0 in | Wt 171.6 lb

## 2013-05-21 DIAGNOSIS — K5090 Crohn's disease, unspecified, without complications: Secondary | ICD-10-CM

## 2013-05-21 LAB — COMPREHENSIVE METABOLIC PANEL
ALK PHOS: 88 U/L (ref 39–117)
ALT: 19 U/L (ref 0–35)
AST: 18 U/L (ref 0–37)
Albumin: 4.4 g/dL (ref 3.5–5.2)
BILIRUBIN TOTAL: 0.9 mg/dL (ref 0.3–1.2)
BUN: 13 mg/dL (ref 6–23)
CALCIUM: 11 mg/dL — AB (ref 8.4–10.5)
CO2: 28 mEq/L (ref 19–32)
CREATININE: 0.7 mg/dL (ref 0.4–1.2)
Chloride: 106 mEq/L (ref 96–112)
GFR: 109.07 mL/min (ref >60.00)
Glucose, Bld: 130 mg/dL — ABNORMAL HIGH (ref 70–99)
Potassium: 3.9 mEq/L (ref 3.5–5.1)
Sodium: 140 mEq/L (ref 135–145)
Total Protein: 8.1 g/dL (ref 6.0–8.3)

## 2013-05-21 LAB — CBC WITH DIFFERENTIAL/PLATELET
BASOS ABS: 0 10*3/uL (ref 0.0–0.1)
BASOS PCT: 0.4 % (ref 0.0–3.0)
EOS ABS: 0.1 10*3/uL (ref 0.0–0.7)
Eosinophils Relative: 1.3 % (ref 0.0–5.0)
HCT: 43 % (ref 36.0–46.0)
HEMOGLOBIN: 14.2 g/dL (ref 12.0–15.0)
LYMPHS ABS: 3.9 10*3/uL (ref 0.7–4.0)
LYMPHS PCT: 40.6 % (ref 12.0–46.0)
MCHC: 33 g/dL (ref 30.0–36.0)
MCV: 91.6 fl (ref 78.0–100.0)
MONO ABS: 0.5 10*3/uL (ref 0.1–1.0)
Monocytes Relative: 5.2 % (ref 3.0–12.0)
NEUTROS ABS: 5.1 10*3/uL (ref 1.4–7.7)
Neutrophils Relative %: 52.5 % (ref 43.0–77.0)
Platelets: 443 10*3/uL — ABNORMAL HIGH (ref 150.0–400.0)
RBC: 4.69 Mil/uL (ref 3.87–5.11)
RDW: 13.8 % (ref 11.5–14.6)
WBC: 9.7 10*3/uL (ref 4.5–10.5)

## 2013-05-21 LAB — HEPATITIS B SURFACE ANTIGEN: HEP B S AG: NEGATIVE

## 2013-05-21 LAB — HEPATITIS A ANTIBODY, TOTAL: HEP A TOTAL AB: NONREACTIVE

## 2013-05-21 LAB — HEPATITIS B SURFACE ANTIBODY,QUALITATIVE: Hep B S Ab: NEGATIVE

## 2013-05-21 MED ORDER — NA SULFATE-K SULFATE-MG SULF 17.5-3.13-1.6 GM/177ML PO SOLN
1.0000 | Freq: Once | ORAL | Status: DC
Start: 2013-05-21 — End: 2013-06-03

## 2013-05-21 NOTE — Assessment & Plan Note (Signed)
Patient is in clinical remission off azathioprine for 2 months and taking lialda sporadically.  She has requested changing from lialda because of urinary complaints of black spots in the urine.  Since her course has been indolent for several years I think we can consider downgrading or simplifying her therapy.  Before doing this, however, will schedule colonoscopy to determine whether she has had complete mucosal healing.  Recommendations #1 check CBC, comprehensive metabolic profile, serologies for hepatitis A, B, and C, and vaccinate accordingly #2 continue current medications for now #3 colonoscopy #4 PPD

## 2013-05-21 NOTE — Patient Instructions (Addendum)
You have been scheduled for a colonoscopy with propofol. Please follow written instructions given to you at your visit today.  Please pick up your prep kit at the pharmacy within the next 1-3 days. If you use inhalers (even only as needed), please bring them with you on the day of your procedure. Your physician has requested that you go to www.startemmi.com and enter the access code given to you at your visit today. This web site gives a general overview about your procedure. However, you should still follow specific instructions given to you by our office regarding your preparation for the procedure.  Go to the basement for labs today You have refused a Pneumovax vaccine today You will receive a PPD today to be read on Monday

## 2013-05-21 NOTE — Addendum Note (Signed)
Addended by: Marlowe Kays on: 05/21/2013 10:23 AM   Modules accepted: Orders

## 2013-05-21 NOTE — Progress Notes (Signed)
_                                                                                                                History of Present Illness: 53 year old Afro-American female with Crohn's colitis and enteritis diagnosed in 2011, maintain on lialda and azathioprine, here for routine followup.  Her presentation of Crohn's disease is constipation and abdominal pain.  She has been in remission for several years.  At this point she takes lialda for perhaps 2 weeks at a time.  She complains that she develops black spots in her urine when she takes lialda.  She has been off azathioprine for over 2 months.  She takes this sporadically as well.  If she becomes constipated she will take lialda for a day or 2 with relief.    Past Medical History  Diagnosis Date  . Anemia   . Colitis   . Crohn's disease    Past Surgical History  Procedure Laterality Date  . Cholecystectomy     family history includes Breast cancer in an other family member; Colon cancer in her maternal grandmother; Depression in her brother and another family member. Current Outpatient Prescriptions  Medication Sig Dispense Refill  . acetaminophen (TYLENOL) 500 MG tablet Take 1,000 mg by mouth every 6 (six) hours as needed. For pain      . azaTHIOprine (IMURAN) 50 MG tablet take 1 tablet by mouth once daily  30 tablet  3  . cholecalciferol (VITAMIN D) 1000 UNITS tablet Take 5,000 Units by mouth daily.       . ferrous sulfate 325 (65 FE) MG tablet Take 325 mg by mouth daily with breakfast.        . mesalamine (LIALDA) 1.2 G EC tablet Take 1 tablet (1.2 g total) by mouth daily with breakfast.  30 tablet  1  . Multiple Vitamin (MULTIVITAMIN) tablet Take 1 tablet by mouth daily.        Marland Kitchen omega-3 acid ethyl esters (LOVAZA) 1 G capsule Take 1 g by mouth daily.      Marland Kitchen oseltamivir (TAMIFLU) 75 MG capsule Take 1 capsule (75 mg total) by mouth every 12 (twelve) hours.  10 capsule  0   No current facility-administered  medications for this visit.   Allergies as of 05/21/2013  . (No Known Allergies)    reports that she has been smoking.  She has never used smokeless tobacco. She reports that she does not drink alcohol or use illicit drugs.     Review of Systems: Pertinent positive and negative review of systems were noted in the above HPI section. All other review of systems were otherwise negative.  Vital signs were reviewed in today's medical record Physical Exam: General: Well developed , well nourished, no acute distress Skin: anicteric Head: Normocephalic and atraumatic Eyes:  sclerae anicteric, EOMI Ears: Normal auditory acuity Mouth: No deformity or lesions Neck: Supple, no masses or thyromegaly Lungs: Clear throughout to auscultation Heart: Regular rate and rhythm; no murmurs, rubs or bruits  Abdomen: Soft, non tender and non distended. No masses, hepatosplenomegaly or hernias noted. Normal Bowel sounds Rectal:deferred Musculoskeletal: Symmetrical with no gross deformities  Skin: No lesions on visible extremities Pulses:  Normal pulses noted Extremities: No clubbing, cyanosis, edema or deformities noted Neurological: Alert oriented x 4, grossly nonfocal Cervical Nodes:  No significant cervical adenopathy Inguinal Nodes: No significant inguinal adenopathy Psychological:  Alert and cooperative. Normal mood and affect  See Assessment and Plan under Problem List

## 2013-05-24 LAB — HEPATITIS C RNA QUANTITATIVE: HCV QUANT: NOT DETECTED [IU]/mL (ref <15)

## 2013-05-25 ENCOUNTER — Telehealth: Payer: Self-pay | Admitting: Gastroenterology

## 2013-05-25 ENCOUNTER — Other Ambulatory Visit: Payer: Self-pay

## 2013-05-25 DIAGNOSIS — Z111 Encounter for screening for respiratory tuberculosis: Secondary | ICD-10-CM

## 2013-05-25 NOTE — Telephone Encounter (Signed)
Elizabeth Andrade to give PPD again  stessed tp patient must be here in 48 hours to have it read

## 2013-05-27 LAB — TB SKIN TEST
INDURATION: 0 mm
TB SKIN TEST: NEGATIVE

## 2013-06-02 ENCOUNTER — Other Ambulatory Visit: Payer: Self-pay

## 2013-06-02 DIAGNOSIS — K5090 Crohn's disease, unspecified, without complications: Secondary | ICD-10-CM

## 2013-06-03 ENCOUNTER — Encounter: Payer: Self-pay | Admitting: Gastroenterology

## 2013-06-03 ENCOUNTER — Ambulatory Visit (AMBULATORY_SURGERY_CENTER): Payer: BC Managed Care – PPO | Admitting: Gastroenterology

## 2013-06-03 VITALS — BP 112/56 | HR 71 | Temp 97.3°F | Resp 15 | Ht 66.0 in | Wt 171.0 lb

## 2013-06-03 DIAGNOSIS — K56699 Other intestinal obstruction unspecified as to partial versus complete obstruction: Secondary | ICD-10-CM

## 2013-06-03 DIAGNOSIS — K5090 Crohn's disease, unspecified, without complications: Secondary | ICD-10-CM

## 2013-06-03 MED ORDER — SODIUM CHLORIDE 0.9 % IV SOLN: 500.0000 mL | INTRAVENOUS | Status: DC

## 2013-06-03 NOTE — Patient Instructions (Signed)
YOU HAD AN ENDOSCOPIC PROCEDURE TODAY AT THE [facility]: Refer to the procedure report that was given to you for any specific questions about what was found during the examination.  If the procedure report does not answer your questions, please call your gastroenterologist to clarify.  If you requested that your care partner not be given the details of your procedure findings, then the procedure report has been included in a sealed envelope for you to review at your convenience later.  YOU SHOULD EXPECT: Some feelings of bloating in the abdomen. Passage of more gas than usual.  Walking can help get rid of the air that was put into your GI tract during the procedure and reduce the bloating. If you had a lower endoscopy (such as a colonoscopy or flexible sigmoidoscopy) you may notice spotting of blood in your stool or on the toilet paper. If you underwent a bowel prep for your procedure, then you may not have a normal bowel movement for a few days.  DIET: Your first meal following the procedure should be a light meal and then it is ok to progress to your normal diet.  A half-sandwich or bowl of soup is an example of a good first meal.  Heavy or fried foods are harder to digest and may make you feel nauseous or bloated.  Likewise meals heavy in dairy and vegetables can cause extra gas to form and this can also increase the bloating.  Drink plenty of fluids but you should avoid alcoholic beverages for 24 hours.  ACTIVITY: Your care partner should take you home directly after the procedure.  You should plan to take it easy, moving slowly for the rest of the day.  You can resume normal activity the day after the procedure however you should NOT DRIVE or use heavy machinery for 24 hours (because of the sedation medicines used during the test).    SYMPTOMS TO REPORT IMMEDIATELY: A gastroenterologist can be reached at any hour.  During normal business hours, 8:30 AM to 5:00 PM Monday through Friday,  call [phone].  After hours and on weekends, please call the GI answering service at [phone] who will take a message and have the physician on call contact you.   Following lower endoscopy (colonoscopy or flexible sigmoidoscopy):  Excessive amounts of blood in the stool  Significant tenderness or worsening of abdominal pains  Swelling of the abdomen that is new, acute  Fever of 100F or higher      FOLLOW UP: If any biopsies were taken you will be contacted by phone or by letter within the next 1-3 weeks.  Call your gastroenterologist if you have not heard about the biopsies in 3 weeks.  Our staff will call the home number listed on your records the next business day following your procedure to check on you and address any questions or concerns that you may have at that time regarding the information given to you following your procedure. This is a courtesy call and so if there is no answer at the home number and we have not heard from you through the emergency physician on call, we will assume that you have returned to your regular daily activities without incident.  SIGNATURES/CONFIDENTIALITY: You and/or your care partner have signed paperwork which will be entered into your electronic medical record.  These signatures attest to the fact that that the information above on your After Visit Summary has been reviewed and is understood.  Full responsibility of  the confidentiality of this discharge information lies with you and/or your care-partner.    HOLD AZATHIOPRINE  CONTINUE LIALDA   MAKE OFFICE APPOINTMENT IN ONE YEAR

## 2013-06-03 NOTE — Op Note (Signed)
[facility] [address] [place], [id number]   COLONOSCOPY PROCEDURE REPORT  PATIENT: Elizabeth DurandOwens, [name]  MR#: [id number] BIRTHDATE: 1960-04-04 , 52  yrs. old GENDER: Female ENDOSCOPIST: Louis Meckelobert D Quetzali Heinle, MD REFERRED XB:[id number]:Stephanie Sandi MealyAlm, M.D. PROCEDURE DATE:  06/03/2013 PROCEDURE:   Colonoscopy, diagnostic First Screening Colonoscopy - Avg.  risk and is 50 yrs.  old or older - No.  Prior Negative Screening - Now for repeat screening. N/A  History of Adenoma - Now for follow-up colonoscopy & has been > or = to 3 yrs.  N/A  Polyps Removed Today? No.  Recommend repeat exam, <10 yrs? No. ASA CLASS:   Class II INDICATIONS:follow up for previously diagnosed Crohn's disease. MEDICATIONS: MAC sedation, administered by CRNA and Propofol (Diprivan) 240 mg IV  DESCRIPTION OF PROCEDURE:   After the risks benefits and alternatives of the procedure were thoroughly explained, informed consent was obtained.  A digital rectal exam revealed no abnormalities of the rectum.   The LB [id number]  endoscope was introduced through the anus and advanced to the terminal ileum which was intubated for a short distance. No adverse events experienced.   The quality of the prep was excellent using Suprep The instrument was then slowly withdrawn as the colon was fully examined.      COLON FINDINGS: There was a moderate stricture at approximately 20 cm from the anus.  Surrounding mucosa was slightly edematous.  The 12 mm colonoscope passed with resistance.  There is scattered diverticula in the more proximal sigmoid colon.  Remainder of the colon was entirely normal. Scope was passed into the terminal ileum where the mucosa was atrophic or featureless.   There was a moderate stricture at approximately 20 cm from the anus.  Surrounding mucosa was slightly edematous.  The 12 mm colonoscope passed with resistance.  There is scattered diverticula in the more proximal sigmoid  colon. Remainder of the colon was entirely normal. Scope was passed into the terminal ileum where the mucosa was atrophic or featureless.  Retroflexed views revealed no abnormalities. The time to cecum=4 minutes 55 seconds.  Withdrawal time=8 minutes 21 seconds.  The scope was withdrawn and the procedure completed. COMPLICATIONS: There were no complications.  ENDOSCOPIC IMPRESSION: 1.   stricture, sigmoid colon 2.  atrophic mucosa, terminal ileum  Findings are compatible with inactive Crohn's colitis and ileitis  RECOMMENDATIONS: hold azathioprine and continue lialda Office visit one year  eSigned:  Louis Meckelobert D Idil Maslanka, MD 06/03/2013 2:29 PM   cc:   PATIENT NAME:  Elizabeth DurandOwens, Tarea M. MR#: [id number]

## 2013-06-03 NOTE — Progress Notes (Signed)
Report to pacu rn, vss, bbs=clear 

## 2013-06-04 ENCOUNTER — Telehealth: Payer: Self-pay | Admitting: *Deleted

## 2013-06-04 NOTE — Telephone Encounter (Signed)
  Follow up Call-  Call back number 06/03/2013  Post procedure Call Back phone  # [phone]  Permission to leave phone message Yes    [facility]

## 2013-06-16 ENCOUNTER — Ambulatory Visit: Payer: BC Managed Care – PPO | Admitting: Gastroenterology

## 2014-02-22 ENCOUNTER — Encounter (HOSPITAL_COMMUNITY): Payer: Self-pay | Admitting: Emergency Medicine

## 2014-02-22 ENCOUNTER — Emergency Department (HOSPITAL_COMMUNITY)
Admission: EM | Admit: 2014-02-22 | Discharge: 2014-02-22 | Disposition: A | Discharge status: Home / Self Care | Payer: BC Managed Care – PPO | Attending: Emergency Medicine | Admitting: Emergency Medicine

## 2014-02-22 DIAGNOSIS — K219 Gastro-esophageal reflux disease without esophagitis: Secondary | ICD-10-CM | POA: Diagnosis not present

## 2014-02-22 DIAGNOSIS — Z720 Tobacco use: Secondary | ICD-10-CM | POA: Insufficient documentation

## 2014-02-22 DIAGNOSIS — D649 Anemia, unspecified: Secondary | ICD-10-CM | POA: Diagnosis not present

## 2014-02-22 DIAGNOSIS — Z79899 Other long term (current) drug therapy: Secondary | ICD-10-CM | POA: Insufficient documentation

## 2014-02-22 DIAGNOSIS — IMO0001 Reserved for inherently not codable concepts without codable children: Secondary | ICD-10-CM

## 2014-02-22 DIAGNOSIS — R0989 Other specified symptoms and signs involving the circulatory and respiratory systems: Secondary | ICD-10-CM | POA: Diagnosis present

## 2014-02-22 MED ORDER — GI COCKTAIL ~~LOC~~
30.0000 mL | Freq: Once | ORAL | Status: AC
  Administered 2014-02-22: 30 mL via ORAL
  Filled 2014-02-22: qty 30

## 2014-02-22 MED ORDER — LIDOCAINE VISCOUS 2 % MT SOLN: 20.0000 mL | Freq: Four times a day (QID) | OROMUCOSAL | Status: DC | PRN

## 2014-02-22 MED ORDER — ONDANSETRON 4 MG PO TBDP
4.0000 mg | ORAL_TABLET | Freq: Once | ORAL | Status: AC
  Administered 2014-02-22: 4 mg via ORAL
  Filled 2014-02-22: qty 1

## 2014-02-22 MED ORDER — ESOMEPRAZOLE MAGNESIUM 40 MG PO CPDR: 40.0000 mg | DELAYED_RELEASE_CAPSULE | Freq: Every day | ORAL | Status: DC

## 2014-02-22 NOTE — Discharge Instructions (Signed)
Esophagitis Esophagitis is inflammation of the esophagus. It can involve swelling, soreness, and pain in the esophagus. This condition can make it difficult and painful to swallow. CAUSES  Most causes of esophagitis are not serious. Many different factors can cause esophagitis, including:  Gastroesophageal reflux disease (GERD). This is when acid from your stomach flows up into the esophagus.  Recurrent vomiting.  An allergic-type reaction.  Certain medicines, especially those that come in large pills.  Ingestion of harmful chemicals, such as household cleaning products.  Heavy alcohol use.  An infection of the esophagus.  Radiation treatment for cancer.  Certain diseases such as sarcoidosis, Crohn's disease, and scleroderma. These diseases may cause recurrent esophagitis. SYMPTOMS   Trouble swallowing.  Painful swallowing.  Chest pain.  Difficulty breathing.  Nausea.  Vomiting.  Abdominal pain. DIAGNOSIS  Your caregiver will take your history and do a physical exam. Depending upon what your caregiver finds, certain tests may also be done, including:  Barium X-ray. You will drink a solution that coats the esophagus, and X-rays will be taken.  Endoscopy. A lighted tube is put down the esophagus so your caregiver can examine the area.  Allergy tests. These can sometimes be arranged through follow-up visits. TREATMENT  Treatment will depend on the cause of your esophagitis. In some cases, steroids or other medicines may be given to help relieve your symptoms or to treat the underlying cause of your condition. Medicines that may be recommended include:  Viscous lidocaine, to soothe the esophagus.  Antacids.  Acid reducers.  Proton pump inhibitors.  Antiviral medicines for certain viral infections of the esophagus.  Antifungal medicines for certain fungal infections of the esophagus.  Antibiotic medicines, depending on the cause of the esophagitis. HOME CARE  INSTRUCTIONS   Avoid foods and drinks that seem to make your symptoms worse.  Eat small, frequent meals instead of large meals.  Avoid eating for the 3 hours prior to your bedtime.  If you have trouble taking pills, use a pill splitter to decrease the size and likelihood of the pill getting stuck or injuring the esophagus on the way down. Drinking water after taking a pill also helps.  Stop smoking if you smoke.  Maintain a healthy weight.  Wear loose-fitting clothing. Do not wear anything tight around your waist that causes pressure on your stomach.  Raise the head of your bed 6 to 8 inches with wood blocks to help you sleep. Extra pillows will not help.  Only take over-the-counter or prescription medicines as directed by your caregiver. SEEK IMMEDIATE MEDICAL CARE IF:  You have severe chest pain that radiates into your arm, neck, or jaw.  You feel sweaty, dizzy, or lightheaded.  You have shortness of breath.  You vomit blood.  You have difficulty or pain with swallowing.  You have bloody or black, tarry stools.  You have a fever.  You have a burning sensation in the chest more than 3 times a week for more than 2 weeks.  You cannot swallow, drink, or eat.  You drool because you cannot swallow your saliva. MAKE SURE YOU:  Understand these instructions.  Will watch your condition.  Will get help right away if you are not doing well or get worse. Document Released: 02/22/2004 Document Revised: 04/08/2011 Document Reviewed: 09/14/2010 Exit[employer] Patient Information 2015 [employer]. This information is not intended to replace advice given to you by your health care provider. Make sure you discuss any questions you have with your health care provider.  

## 2014-02-22 NOTE — ED Provider Notes (Signed)
CSN: [id number]     Arrival date & time 02/22/14  4098 History   First MD Initiated Contact with Patient 02/22/14 1021     Chief Complaint  Patient presents with  . mucous stuck in throat      (Consider location/radiation/quality/duration/timing/severity/associated sxs/prior Treatment) HPI Social 54 year old female with a past medical history of anemia, Crohn's and colitis who presents emergency Department with chief complaint of throat discomfort. She states that for the past several days she has had a feeling of something "stuck in my throat." She has had frequent belching, sensation of burning, dysgeusia. She denies a known history of acid reflux. She is not currently on any immunosuppressive therapies and has been in remission from Crohn's for several months. She denies any white plaques in her mouth, history of HIV, diabetes. She has been using Tums without significant relief. His associated nausea without vomiting.  Past Medical History  Diagnosis Date  . Anemia   . Colitis   . Crohn's disease    Past Surgical History  Procedure Laterality Date  . Cholecystectomy     Family History  Problem Relation Age of Onset  . Colon cancer Maternal Grandmother   . Breast cancer      aunt  . Depression    . Depression Brother     committed suicide in 1980   History  Substance Use Topics  . Smoking status: Current Every Day Smoker  . Smokeless tobacco: Never Used  . Alcohol Use: No   OB History    No data available     Review of Systems  Ten systems reviewed and are negative for acute change, except as noted in the HPI.    Allergies  Review of patient's allergies indicates no known allergies.  Home Medications   Prior to Admission medications   Medication Sig Start Date End Date Taking? Authorizing Provider  acetaminophen (TYLENOL) 500 MG tablet Take 1,000 mg by mouth every 6 (six) hours as needed. For pain    Historical Provider, MD  cholecalciferol (VITAMIN D) 1000  UNITS tablet Take 5,000 Units by mouth daily.     Historical Provider, MD  esomeprazole (NEXIUM) 40 MG capsule Take 1 capsule (40 mg total) by mouth daily. 02/22/14   Arthor Captain, PA-C  ferrous sulfate 325 (65 FE) MG tablet Take 325 mg by mouth daily with breakfast.      Historical Provider, MD  lidocaine (XYLOCAINE) 2 % solution Use as directed 20 mLs in the mouth or throat 4 (four) times daily as needed for mouth pain (swallow). 02/22/14   Arthor Captain, PA-C  mesalamine (LIALDA) 1.2 G EC tablet Take 1 tablet (1.2 g total) by mouth daily with breakfast. 04/12/13   Louis Meckel, MD  Multiple Vitamin (MULTIVITAMIN) tablet Take 1 tablet by mouth daily.      Historical Provider, MD  omega-3 acid ethyl esters (LOVAZA) 1 G capsule Take 1 g by mouth daily.    Historical Provider, MD  oseltamivir (TAMIFLU) 75 MG capsule Take 1 capsule (75 mg total) by mouth every 12 (twelve) hours. 02/27/12   Roxy Horseman, PA-C   BP 128/73 mmHg  Pulse 91  Temp(Src) 98.3 F (36.8 C) (Oral)  Resp 16  SpO2 100% Physical Exam  Constitutional: She is oriented to person, place, and time. She appears well-developed and well-nourished. No distress.  HENT:  Head: Normocephalic and atraumatic.  Mouth/Throat: Uvula is midline. Posterior oropharyngeal edema present. No oropharyngeal exudate, posterior oropharyngeal erythema or tonsillar abscesses.  Eyes: Conjunctivae are normal. No scleral icterus.  Neck: Normal range of motion.  Cardiovascular: Normal rate, regular rhythm and normal heart sounds.  Exam reveals no gallop and no friction rub.   No murmur heard. Pulmonary/Chest: Effort normal and breath sounds normal. No respiratory distress.  Abdominal: Soft. Bowel sounds are normal. She exhibits no distension and no mass. There is no tenderness. There is no guarding.  Belching during examination  Neurological: She is alert and oriented to person, place, and time.  Skin: Skin is warm and dry. She is not  diaphoretic.  Nursing note and vitals reviewed.   ED Course  Procedures (including critical care time) Labs Review Labs Reviewed  CBG MONITORING, ED    Imaging Review No results found.   EKG Interpretation None      MDM   Final diagnoses:  Reflux    Patient here with symptoms consistent with possibility of GERD. I do not see any signs of arthritis and given the fact that the patient is not on any immune suppressant. Currently, I doubt this as the etiology of her discomfort. Have ordered a CBG on the patient. She will receive a GI cocktail. Will reevaluate shortly.   Patient with significant relief of her symptoms after GI cocktail. I feel that she likely has reflux esophagitis. Patient will be discharged with Nexium and viscous lidocaine for symptomatic relief. I have discussed the fact that she needs to follow very closely with her GI specialist as she cannot manifestations of Crohn's throw the entire gastrointestinal tract including the mouth and throat.   Arthor Captain, PA-C 02/22/14 1210  Toy Baker, MD 02/25/14 [phone]

## 2014-02-22 NOTE — ED Notes (Signed)
Per pt, states she feels like there is mucous stuck in throat-makes her sick

## 2014-02-27 ENCOUNTER — Encounter (HOSPITAL_COMMUNITY): Payer: Self-pay | Admitting: Emergency Medicine

## 2014-02-27 ENCOUNTER — Emergency Department (HOSPITAL_COMMUNITY): Payer: BC Managed Care – PPO

## 2014-02-27 ENCOUNTER — Inpatient Hospital Stay (HOSPITAL_COMMUNITY)
Admission: EM | Admit: 2014-02-27 | Discharge: 2014-03-02 | DRG: 638 | Disposition: A | Discharge status: Home / Self Care | Payer: BC Managed Care – PPO | Attending: Family Medicine | Admitting: Family Medicine

## 2014-02-27 DIAGNOSIS — R070 Pain in throat: Secondary | ICD-10-CM | POA: Diagnosis not present

## 2014-02-27 DIAGNOSIS — Z9049 Acquired absence of other specified parts of digestive tract: Secondary | ICD-10-CM | POA: Diagnosis present

## 2014-02-27 DIAGNOSIS — B370 Candidal stomatitis: Secondary | ICD-10-CM | POA: Diagnosis present

## 2014-02-27 DIAGNOSIS — K5090 Crohn's disease, unspecified, without complications: Secondary | ICD-10-CM | POA: Diagnosis present

## 2014-02-27 DIAGNOSIS — B3781 Candidal esophagitis: Secondary | ICD-10-CM | POA: Diagnosis present

## 2014-02-27 DIAGNOSIS — E860 Dehydration: Secondary | ICD-10-CM | POA: Diagnosis present

## 2014-02-27 DIAGNOSIS — F1010 Alcohol abuse, uncomplicated: Secondary | ICD-10-CM | POA: Diagnosis present

## 2014-02-27 DIAGNOSIS — D649 Anemia, unspecified: Secondary | ICD-10-CM | POA: Diagnosis present

## 2014-02-27 DIAGNOSIS — K210 Gastro-esophageal reflux disease with esophagitis: Secondary | ICD-10-CM | POA: Diagnosis present

## 2014-02-27 DIAGNOSIS — R739 Hyperglycemia, unspecified: Secondary | ICD-10-CM | POA: Diagnosis present

## 2014-02-27 DIAGNOSIS — E1165 Type 2 diabetes mellitus with hyperglycemia: Principal | ICD-10-CM | POA: Diagnosis present

## 2014-02-27 DIAGNOSIS — R1310 Dysphagia, unspecified: Secondary | ICD-10-CM | POA: Diagnosis present

## 2014-02-27 DIAGNOSIS — B3789 Other sites of candidiasis: Secondary | ICD-10-CM | POA: Diagnosis present

## 2014-02-27 DIAGNOSIS — E119 Type 2 diabetes mellitus without complications: Secondary | ICD-10-CM

## 2014-02-27 DIAGNOSIS — F418 Other specified anxiety disorders: Secondary | ICD-10-CM | POA: Diagnosis present

## 2014-02-27 DIAGNOSIS — J028 Acute pharyngitis due to other specified organisms: Secondary | ICD-10-CM | POA: Diagnosis present

## 2014-02-27 DIAGNOSIS — Z87891 Personal history of nicotine dependence: Secondary | ICD-10-CM | POA: Diagnosis present

## 2014-02-27 DIAGNOSIS — F1011 Alcohol abuse, in remission: Secondary | ICD-10-CM | POA: Diagnosis present

## 2014-02-27 DIAGNOSIS — Z794 Long term (current) use of insulin: Secondary | ICD-10-CM

## 2014-02-27 DIAGNOSIS — F419 Anxiety disorder, unspecified: Secondary | ICD-10-CM | POA: Diagnosis present

## 2014-02-27 DIAGNOSIS — F17210 Nicotine dependence, cigarettes, uncomplicated: Secondary | ICD-10-CM | POA: Diagnosis present

## 2014-02-27 DIAGNOSIS — R0789 Other chest pain: Secondary | ICD-10-CM

## 2014-02-27 DIAGNOSIS — Z720 Tobacco use: Secondary | ICD-10-CM | POA: Diagnosis present

## 2014-02-27 HISTORY — DX: Candidal stomatitis: B37.0

## 2014-02-27 HISTORY — DX: Type 2 diabetes mellitus without complications: E11.9

## 2014-02-27 HISTORY — DX: Other abnormal findings in specimens from female genital organs: R87.89

## 2014-02-27 LAB — COMPREHENSIVE METABOLIC PANEL
ALT: 24 U/L (ref 0–35)
AST: 20 U/L (ref 0–37)
Albumin: 3.5 g/dL (ref 3.5–5.2)
Alkaline Phosphatase: 131 U/L — ABNORMAL HIGH (ref 39–117)
Anion gap: 9 (ref 5–15)
BUN: 10 mg/dL (ref 6–23)
CO2: 24 mmol/L (ref 19–32)
CREATININE: 0.73 mg/dL (ref 0.50–1.10)
Calcium: 9.8 mg/dL (ref 8.4–10.5)
Chloride: 110 mmol/L (ref 96–112)
GFR calc non Af Amer: >90 mL/min (ref >90)
Glucose, Bld: 405 mg/dL — ABNORMAL HIGH (ref 70–99)
Potassium: 4.1 mmol/L (ref 3.5–5.1)
Sodium: 143 mmol/L (ref 135–145)
Total Bilirubin: 0.5 mg/dL (ref 0.3–1.2)
Total Protein: 6.6 g/dL (ref 6.0–8.3)

## 2014-02-27 LAB — CBC WITH DIFFERENTIAL/PLATELET
BASOS ABS: 0 10*3/uL (ref 0.0–0.1)
BASOS PCT: 0 % (ref 0–1)
EOS ABS: 0.1 10*3/uL (ref 0.0–0.7)
EOS PCT: 1 % (ref 0–5)
HCT: 47.2 % — ABNORMAL HIGH (ref 36.0–46.0)
Hemoglobin: 15.6 g/dL — ABNORMAL HIGH (ref 12.0–15.0)
LYMPHS ABS: 3.3 10*3/uL (ref 0.7–4.0)
LYMPHS PCT: 33 % (ref 12–46)
MCH: 30.2 pg (ref 26.0–34.0)
MCHC: 33.1 g/dL (ref 30.0–36.0)
MCV: 91.3 fL (ref 78.0–100.0)
MONOS PCT: 7 % (ref 3–12)
Monocytes Absolute: 0.7 10*3/uL (ref 0.1–1.0)
NEUTROS PCT: 59 % (ref 43–77)
Neutro Abs: 5.9 10*3/uL (ref 1.7–7.7)
Platelets: 393 10*3/uL (ref 150–400)
RBC: 5.17 MIL/uL — ABNORMAL HIGH (ref 3.87–5.11)
RDW: 12.7 % (ref 11.5–15.5)
WBC: 10 10*3/uL (ref 4.0–10.5)

## 2014-02-27 LAB — BASIC METABOLIC PANEL
ANION GAP: 12 (ref 5–15)
BUN: 12 mg/dL (ref 6–23)
CALCIUM: 10.4 mg/dL (ref 8.4–10.5)
CHLORIDE: 95 mmol/L — AB (ref 96–112)
CO2: 27 mmol/L (ref 19–32)
Creatinine, Ser: 1.02 mg/dL (ref 0.50–1.10)
GFR, EST AFRICAN AMERICAN: 71 mL/min — AB (ref >90)
GFR, EST NON AFRICAN AMERICAN: 62 mL/min — AB (ref >90)
Glucose, Bld: 809 mg/dL (ref 70–99)
POTASSIUM: 4.5 mmol/L (ref 3.5–5.1)
Sodium: 134 mmol/L — ABNORMAL LOW (ref 135–145)

## 2014-02-27 LAB — GLUCOSE, CAPILLARY
GLUCOSE-CAPILLARY: 348 mg/dL — AB (ref 70–99)
Glucose-Capillary: 271 mg/dL — ABNORMAL HIGH (ref 70–99)

## 2014-02-27 LAB — PHOSPHORUS: Phosphorus: 1.9 mg/dL — ABNORMAL LOW (ref 2.3–4.6)

## 2014-02-27 LAB — MAGNESIUM: Magnesium: 1.9 mg/dL (ref 1.5–2.5)

## 2014-02-27 LAB — CBG MONITORING, ED: Glucose-Capillary: 471 mg/dL — ABNORMAL HIGH (ref 70–99)

## 2014-02-27 MED ORDER — MESALAMINE 1.2 G PO TBEC
1200.0000 mg | DELAYED_RELEASE_TABLET | Freq: Every day | ORAL | Status: DC
  Administered 2014-02-28 – 2014-03-01 (×2): 1.2 g via ORAL
  Filled 2014-02-27 (×4): qty 1

## 2014-02-27 MED ORDER — INSULIN REGULAR HUMAN 100 UNIT/ML IJ SOLN
INTRAMUSCULAR | Status: DC | PRN
  Filled 2014-02-27: qty 2.5

## 2014-02-27 MED ORDER — INSULIN ASPART 100 UNIT/ML ~~LOC~~ SOLN: 0.0000 [IU] | SUBCUTANEOUS | Status: DC

## 2014-02-27 MED ORDER — GI COCKTAIL ~~LOC~~
30.0000 mL | Freq: Once | ORAL | Status: AC
  Administered 2014-02-27: 30 mL via ORAL
  Filled 2014-02-27: qty 30

## 2014-02-27 MED ORDER — SODIUM CHLORIDE 0.9 % IV BOLUS (SEPSIS): 1000.0000 mL | Freq: Once | INTRAVENOUS | Status: DC

## 2014-02-27 MED ORDER — MAGIC MOUTHWASH
15.0000 mL | Freq: Once | ORAL | Status: DC
  Filled 2014-02-27: qty 15

## 2014-02-27 MED ORDER — INSULIN REGULAR BOLUS VIA INFUSION
0.0000 [IU] | Freq: Three times a day (TID) | INTRAVENOUS | Status: DC | PRN
  Filled 2014-02-27: qty 10

## 2014-02-27 MED ORDER — SODIUM CHLORIDE 0.9 % IV SOLN
1000.0000 mL | Freq: Once | INTRAVENOUS | Status: AC
  Administered 2014-02-27: 1000 mL via INTRAVENOUS

## 2014-02-27 MED ORDER — ONDANSETRON HCL 4 MG PO TABS: 4.0000 mg | ORAL_TABLET | Freq: Four times a day (QID) | ORAL | Status: DC | PRN

## 2014-02-27 MED ORDER — SODIUM CHLORIDE 0.9 % IV SOLN
INTRAVENOUS | Status: DC
  Administered 2014-02-27 – 2014-03-01 (×5): via INTRAVENOUS

## 2014-02-27 MED ORDER — INSULIN REGULAR HUMAN 100 UNIT/ML IJ SOLN
INTRAMUSCULAR | Status: DC
  Administered 2014-02-27: 7.5 [IU]/h via INTRAVENOUS
  Filled 2014-02-27: qty 2.5

## 2014-02-27 MED ORDER — INSULIN ASPART 100 UNIT/ML ~~LOC~~ SOLN
0.0000 [IU] | Freq: Three times a day (TID) | SUBCUTANEOUS | Status: DC
  Administered 2014-02-27: 5 [IU] via SUBCUTANEOUS

## 2014-02-27 MED ORDER — SODIUM CHLORIDE 0.9 % IV SOLN: 1000.0000 mL | INTRAVENOUS | Status: DC

## 2014-02-27 MED ORDER — MAGIC MOUTHWASH
15.0000 mL | Freq: Once | ORAL | Status: AC
  Administered 2014-02-27: 15 mL via ORAL
  Filled 2014-02-27: qty 15

## 2014-02-27 MED ORDER — INSULIN ASPART 100 UNIT/ML ~~LOC~~ SOLN: 3.0000 [IU] | Freq: Three times a day (TID) | SUBCUTANEOUS | Status: DC

## 2014-02-27 MED ORDER — FERROUS SULFATE 325 (65 FE) MG PO TABS
325.0000 mg | ORAL_TABLET | Freq: Every day | ORAL | Status: DC
  Administered 2014-02-28 – 2014-03-02 (×3): 325 mg via ORAL
  Filled 2014-02-27 (×4): qty 1

## 2014-02-27 MED ORDER — DEXTROSE-NACL 5-0.45 % IV SOLN: INTRAVENOUS | Status: DC

## 2014-02-27 MED ORDER — PANTOPRAZOLE SODIUM 40 MG IV SOLR
40.0000 mg | Freq: Two times a day (BID) | INTRAVENOUS | Status: DC
  Administered 2014-02-27 – 2014-02-28 (×3): 40 mg via INTRAVENOUS
  Filled 2014-02-27 (×5): qty 40

## 2014-02-27 MED ORDER — FLUCONAZOLE 100MG IVPB
100.0000 mg | Freq: Every day | INTRAVENOUS | Status: DC
  Administered 2014-02-27 – 2014-02-28 (×2): 100 mg via INTRAVENOUS
  Filled 2014-02-27 (×2): qty 50

## 2014-02-27 MED ORDER — ENOXAPARIN SODIUM 40 MG/0.4ML ~~LOC~~ SOLN
40.0000 mg | SUBCUTANEOUS | Status: DC
  Administered 2014-02-27 – 2014-03-01 (×3): 40 mg via SUBCUTANEOUS
  Filled 2014-02-27 (×4): qty 0.4

## 2014-02-27 MED ORDER — PANTOPRAZOLE SODIUM 40 MG PO TBEC: 40.0000 mg | DELAYED_RELEASE_TABLET | Freq: Every day | ORAL | Status: DC

## 2014-02-27 MED ORDER — GI COCKTAIL ~~LOC~~
30.0000 mL | Freq: Three times a day (TID) | ORAL | Status: DC | PRN
  Filled 2014-02-27: qty 30

## 2014-02-27 MED ORDER — HYDROCODONE-ACETAMINOPHEN 5-325 MG PO TABS
1.0000 | ORAL_TABLET | ORAL | Status: DC | PRN
  Administered 2014-02-28 – 2014-03-01 (×5): 1 via ORAL
  Filled 2014-02-27 (×4): qty 1
  Filled 2014-02-27: qty 2

## 2014-02-27 MED ORDER — INSULIN ASPART 100 UNIT/ML ~~LOC~~ SOLN
0.0000 [IU] | Freq: Every day | SUBCUTANEOUS | Status: DC
  Administered 2014-02-27: 5 [IU] via SUBCUTANEOUS

## 2014-02-27 MED ORDER — INSULIN GLARGINE 100 UNIT/ML ~~LOC~~ SOLN
10.0000 [IU] | Freq: Every day | SUBCUTANEOUS | Status: DC
  Administered 2014-02-27: 10 [IU] via SUBCUTANEOUS
  Filled 2014-02-27: qty 0.1

## 2014-02-27 MED ORDER — ONDANSETRON HCL 4 MG/2ML IJ SOLN
4.0000 mg | Freq: Four times a day (QID) | INTRAMUSCULAR | Status: DC | PRN
  Administered 2014-02-27: 4 mg via INTRAVENOUS
  Filled 2014-02-27: qty 2

## 2014-02-27 NOTE — ED Notes (Signed)
Attempted to call report, RN unavailable at this time.

## 2014-02-27 NOTE — ED Provider Notes (Signed)
CSN: [id number]     Arrival date & time 02/27/14  1114 History   First MD Initiated Contact with Patient 02/27/14 1257     Chief Complaint  Patient presents with  . lump in throat      (Consider location/radiation/quality/duration/timing/severity/associated sxs/prior Treatment) HPI  This is a 54 year old female with a past medical history of Crohn's disease, she is in remission and takes no medications at this time. The patient presents to the emergency department with chief complaint of severe mouth and throat discomfort. The patient was seen by myself 3 days ago in the fast track. At that time, they diagnosed the patient with reflux esophagitis. She is discharged with a PPI and viscous lidocaine. Patient returns today complaining of globus sensation, severe mouth and throat pain. She states that she is having pain every time she swallows and she is drinking large volumes of fluid. She denies being on any immunosuppressive agents. She states that her mouth is now coated in white plaques. She has had little relief with symptomatic treatment. She denies fevers, chills, weight loss.  Past Medical History  Diagnosis Date  . Anemia   . Colitis   . Crohn's disease    Past Surgical History  Procedure Laterality Date  . Cholecystectomy     Family History  Problem Relation Age of Onset  . Colon cancer Maternal Grandmother   . Breast cancer      aunt  . Depression    . Depression Brother     committed suicide in 1980   History  Substance Use Topics  . Smoking status: Current Every Day Smoker  . Smokeless tobacco: Never Used  . Alcohol Use: No   OB History    No data available     Review of Systems  Ten systems reviewed and are negative for acute change, except as noted in the HPI.    Allergies  Review of patient's allergies indicates no known allergies.  Home Medications   Prior to Admission medications   Medication Sig Start Date End Date Taking? Authorizing Provider    acetaminophen (TYLENOL) 500 MG tablet Take 1,000 mg by mouth every 6 (six) hours as needed. For pain   Yes Historical Provider, MD  cholecalciferol (VITAMIN D) 1000 UNITS tablet Take 5,000 Units by mouth daily.    Yes Historical Provider, MD  esomeprazole (NEXIUM) 40 MG capsule Take 1 capsule (40 mg total) by mouth daily. 02/22/14  Yes Arthor Captain, PA-C  ferrous sulfate 325 (65 FE) MG tablet Take 325 mg by mouth daily with breakfast.     Yes Historical Provider, MD  lidocaine (XYLOCAINE) 2 % solution Use as directed 20 mLs in the mouth or throat 4 (four) times daily as needed for mouth pain (swallow). 02/22/14  Yes Arthor Captain, PA-C  mesalamine (LIALDA) 1.2 G EC tablet Take 1 tablet (1.2 g total) by mouth daily with breakfast. 04/12/13  Yes Louis Meckel, MD  Multiple Vitamin (MULTIVITAMIN) tablet Take 1 tablet by mouth daily.     Yes Historical Provider, MD  omega-3 acid ethyl esters (LOVAZA) 1 G capsule Take 1 g by mouth daily.   Yes Historical Provider, MD  oseltamivir (TAMIFLU) 75 MG capsule Take 1 capsule (75 mg total) by mouth every 12 (twelve) hours. Patient not taking: Reported on 02/27/2014 02/27/12   Roxy Horseman, PA-C   BP 113/64 mmHg  Pulse 71  Temp(Src) 98.2 F (36.8 C) (Oral)  Resp 18  Ht  (1.676 m)  Wt  174 lb (78.926 kg)  BMI 28.10 kg/m2  SpO2 98% Physical Exam  Constitutional: She is oriented to person, place, and time. She appears well-developed and well-nourished. No distress.  HENT:  Head: Normocephalic and atraumatic.  Mouth/Throat: Uvula is midline.    Eyes: Conjunctivae are normal. No scleral icterus.  Neck: Normal range of motion.  Cardiovascular: Normal rate, regular rhythm and normal heart sounds.  Exam reveals no gallop and no friction rub.   No murmur heard. Pulmonary/Chest: Effort normal and breath sounds normal. No respiratory distress.  Abdominal: Soft. Bowel sounds are normal. She exhibits no distension and no mass. There is no tenderness.  There is no guarding.  Neurological: She is alert and oriented to person, place, and time.  Skin: Skin is warm and dry. She is not diaphoretic.  Nursing note and vitals reviewed.   ED Course  Procedures (including critical care time) Labs Review Labs Reviewed  BASIC METABOLIC PANEL - Abnormal; Notable for the following:    Sodium 134 (*)    Chloride 95 (*)    Glucose, Bld 809 (*)    GFR calc non Af Amer 62 (*)    GFR calc Af Amer 71 (*)    All other components within normal limits  CBC WITH DIFFERENTIAL/PLATELET - Abnormal; Notable for the following:    RBC 5.17 (*)    Hemoglobin 15.6 (*)    HCT 47.2 (*)    All other components within normal limits  CBG MONITORING, ED - Abnormal; Notable for the following:    Glucose-Capillary 471 (*)    All other components within normal limits  HIV ANTIBODY (ROUTINE TESTING)  MAGNESIUM  PHOSPHORUS  COMPREHENSIVE METABOLIC PANEL  HEMOGLOBIN A1C    Imaging Review Dg Neck Soft Tissue  02/27/2014   CLINICAL DATA:  Feels that something is stuck in throat. Pt was seen on the 26th of this month for same symptoms, pt states she feels as if she has a lump in throat. Pt states she needs some one to do something, draw blood or xray because she is so sick. Pt states she thinks she has pneumonia. Pt denies SOB, chest pain but states she feel she can just vomit up everything. Denies difficulty swallowing but states she has a nasty taste in mouth and feels as if something is moving in chest every time she swallows. Current smoker. States she was previously given medication for reflux.  EXAM: NECK SOFT TISSUES - 1+ VIEW  COMPARISON:  None.  FINDINGS: There is an elongated focus of what appears be calcification just above the hyoid most likely segmental calcification of the stylohyoid ligament. A radiopaque foreign body such as a bone is not completely excluded but felt unlikely.  Soft tissues otherwise unremarkable. Airway is widely patent. No significant bony  abnormality.  IMPRESSION: Probable focus of segmental calcification of the stylohyoid ligament, seen just above the hyoid bone. However, if there is significant clinical concern for a foreign body, additional evaluation would be warranted. If this is a body at the base of the vallecular, may be visible on indirect laryngoscopy and would be visible with nasopharyngeal endoscopy. Exam otherwise unremarkable.   Electronically Signed   By: Amie Portland M.D.   On: 02/27/2014 13:57   Dg Chest 2 View  02/27/2014   CLINICAL DATA:  Chest discomfort. Pt denies SOB, chest pain, or couch but states she feel she can just vomit up everything. Feels that something is stuck in throat. Pt was seen on the  26th of this month for same symptoms, pt states she feels as if she has a lump in throat. Pt states she thinks she has pneumonia. Denies difficulty swallowing but states she has a nasty taste in mouth and feels as if something is moving in chest every time she swallows. Current smoker. States she was previously given medication for reflux. Nondiabetic. Non-HTN.  EXAM: CHEST  2 VIEW  COMPARISON:  None.  FINDINGS: Normal heart, mediastinum and hila.  Lungs are clear.  No pleural effusion or pneumothorax.  Bony thorax is unremarkable.  Clips are upper quadrant reflect a previous cholecystectomy.  IMPRESSION: No active cardiopulmonary disease.   Electronically Signed   By: Amie Portland M.D.   On: 02/27/2014 14:12     EKG Interpretation None      MDM   Final diagnoses:  Chest discomfort    4:54 PM BP 113/64 mmHg  Pulse 71  Temp(Src) 98.2 F (36.8 C) (Oral)  Resp 18  Ht 5\' 6"  (1.676 m)  Wt 174 lb (78.926 kg)  BMI 28.10 kg/m2  SpO2 98% Patient with Blood sugar of 809. The neck x-ray shows probable segmental calcification of the stylohyoid ligament.    Patient started on glucose stabilizer. I spoken with Dr. Izola Price who will admit the patient. She does ask for consult on abnormal imaging of the neck.   I  spoke with Dr. Christella Hartigan on for gastroenterology who states that her symptoms are most likely due to her candida esophagitis. He asked for treatment with IV Diflucan. If her symptoms aren't not resolving in the next couple days that he feels she will need an ENT follow-up. However, he is very willing to come see the patient here in the emergency department. She appears safe for admission. Hemoglobin is elevated. Patient is a chronic daily smoker. There is no other abnormalities acutely on her workup today.  Arthor Captain, PA-C 02/27/14 1654  Arthor Captain, PA-C 02/27/14 1654  Benny Lennert, MD 03/15/14 [phone]

## 2014-02-27 NOTE — ED Notes (Signed)
Bed: WA23 Expected date:  Expected time:  Means of arrival:  Comments: Hold for triage 

## 2014-02-27 NOTE — ED Notes (Signed)
PA/RN notified of abnormal lab 

## 2014-02-27 NOTE — ED Notes (Addendum)
Pt was seen on the 26th of this month for same, pt states she feels as if she has a lump in throat. Pt states she needs some one to do something, draw blood or xray because she is so sick. Pt states she thinks she has pneumonia. Pt denies SOB, chest pain but states she feel she can just vomit up everything. Denies difficulty swallowing but states she has a nasty taste in mouth and feels as if something is moving in chest every time she swallows.

## 2014-02-27 NOTE — H&P (Signed)
[facility] History and Physical  NEETU CARROZZA ZOX:[id number] DOB: 1960/02/19 DOA: 02/27/2014  Referring physician: ED physician PCP: Ancil Boozer, MD   Chief Complaint: throat pain and excessive thirst   HPI:  54 year old female with a past medical history of Crohn's disease, in remission and on mesalamine, presented to [facility] ED with main concern of almost one week duration of persistent throat pain, globus sensation, associated with pain and difficulty with swallowing. She also reports polydipsia but can not get hydrated enough.Her throat is now coated with white patches and she stili feels very dehydrated. Pt denies chest pain or shortness of breath, no abd concerns, no similar events in the past. She also denies fevers, chills, no blood in stool or urine.   In ED, pt is hemodynamically stable, VSS, blood work notable for Glucose > 800. On exam, pt noted to have white coating on the tongue. PA in ED Abigail spoke with Dr. Christella Hartigan GI who recommended starting Diflucan for ? Esophagitis and Protonix IV. If symptoms do not improve, recommends official consultation. [facility] asked to admit for further evaluation to medical unit.  Assessment and Plan: Active Problems: Odynophagia - with white coating on the tongue - started on Diflucan and Protonix IV as recommended - could be related to esophagitis but also from severe dehydration in the setting of hyperglycemia  - will also place on GI cocktail  - will monitor clinical response  - may need official consult in AM - ask for HIV test as well  New onset diabetes  - CBG on initial BMP > 800, pt started on insulin drip in ED - repeat BMP with stable electrolytes and glucose 405 - will place on Lantus 10 U QHS for now, SSI with meal and night time coverage - will need diabetic educator assistance  - A1C requested  - continue IVF, NS  cc/hr  - repeat BMP in AM Crohn's disease - per pt in remission - continue Mesalamine  Focus of  segmental calcification of the stylohyoid ligament - PA in ED Abigail d/w on call GI, feels like no foreign object is there - monitor on Diflucan and Protonix and as noted above may need official GI consult if no improvement   Lovenox SQ for DVT prophylaxis   Radiological Exams on Admission: Dg Neck Soft Tissue  02/27/2014  Probable focus of segmental calcification of the stylohyoid ligament, seen just above the hyoid bone. However, if there is significant clinical concern for a foreign body, additional evaluation would be warranted. If this is a body at the base of the vallecular, may be visible on indirect laryngoscopy and would be visible with nasopharyngeal endoscopy.  Dg Chest 2 View  02/27/2014  No active cardiopulmonary disease.     Code Status: Full Family Communication: Pt and family at bedside Disposition Plan: Admit for further evaluation    [facility] Pager # [phone]   Review of Systems:  Constitutional: Negative for fever, chills and malaise/fatigue. Negative for diaphoresis.  HENT: Negative for hearing loss, ear pain, nosebleeds, congestion, tinnitus and ear discharge.   Eyes: Negative for blurred vision, double vision, photophobia, pain, discharge and redness.  Respiratory: Negative for cough, hemoptysis, sputum production, wheezing and stridor.   Cardiovascular: Negative for chest pain, palpitations, orthopnea, claudication and leg swelling.  Gastrointestinal:  Negative for heartburn, constipation, blood in stool and melena.  Genitourinary: Negative for dhematuria and flank pain.  Musculoskeletal: Negative for myalgias, back pain, joint pain and falls.  Skin: Negative for itching  and rash.  Neurological: Negative for dizziness and weakness Endo/Heme/Allergies: Negative for environmental allergies and polydipsia. Does not bruise/bleed easily.  Psychiatric/Behavioral: Negative for suicidal ideas. The patient is not nervous/anxious.      Past Medical History   Diagnosis Date  . Anemia   . Colitis   . Crohn's disease     Past Surgical History  Procedure Laterality Date  . Cholecystectomy      Social History:  reports that she has been smoking.  She has never used smokeless tobacco. She reports that she does not drink alcohol or use illicit drugs.  No Known Allergies  Family History  Problem Relation Age of Onset  . Colon cancer Maternal Grandmother   . Breast cancer      aunt  . Depression    . Depression Brother     committed suicide in 1980    Prior to Admission medications   Medication Sig Start Date End Date Taking? Authorizing Provider  acetaminophen (TYLENOL) 500 MG tablet Take 1,000 mg by mouth every 6 (six) hours as needed. For pain   Yes Historical Provider, MD  cholecalciferol (VITAMIN D) 1000 UNITS tablet Take 5,000 Units by mouth daily.    Yes Historical Provider, MD  esomeprazole (NEXIUM) 40 MG capsule Take 1 capsule (40 mg total) by mouth daily. 02/22/14  Yes Arthor Captain, PA-C  ferrous sulfate 325 (65 FE) MG tablet Take 325 mg by mouth daily with breakfast.     Yes Historical Provider, MD  lidocaine (XYLOCAINE) 2 % solution Use as directed 20 mLs in the mouth or throat 4 (four) times daily as needed for mouth pain (swallow). 02/22/14  Yes Arthor Captain, PA-C  mesalamine (LIALDA) 1.2 G EC tablet Take 1 tablet (1.2 g total) by mouth daily with breakfast. 04/12/13  Yes Louis Meckel, MD  Multiple Vitamin (MULTIVITAMIN) tablet Take 1 tablet by mouth daily.     Yes Historical Provider, MD  omega-3 acid ethyl esters (LOVAZA) 1 G capsule Take 1 g by mouth daily.   Yes Historical Provider, MD  oseltamivir (TAMIFLU) 75 MG capsule Take 1 capsule (75 mg total) by mouth every 12 (twelve) hours. Patient not taking: Reported on 02/27/2014 02/27/12   Roxy Horseman, PA-C    Physical Exam: Filed Vitals:   02/27/14 1123 02/27/14 1429  BP: 122/87 118/78  Pulse: 97 83  Temp: 98.7 F (37.1 C)   TempSrc: Oral   Resp: 18 18   SpO2: 99% 100%    Physical Exam  Constitutional: Appears well-developed and well-nourished. No distress.  HENT: Normocephalic. External right and left ear normal. Very dry MM, white coating on the tongue  Eyes: Conjunctivae and EOM are normal. PERRLA, no scleral icterus.  Neck: Normal ROM. Neck supple. No JVD. No tracheal deviation. No thyromegaly.  CVS: RRR, S1/S2 +, no murmurs, no gallops, no carotid bruit.  Pulmonary: Effort and breath sounds normal, no stridor, rhonchi, wheezes, rales.  Abdominal: Soft. BS +,  no distension, tenderness, rebound or guarding.  Musculoskeletal: Normal range of motion. No edema and no tenderness.  Lymphadenopathy: No lymphadenopathy noted, cervical, inguinal. Neuro: Alert. Normal reflexes, muscle tone coordination. No cranial nerve deficit. Skin: Skin is warm and dry. No rash noted. Not diaphoretic. No erythema. No pallor.  Psychiatric: Normal mood and affect. Behavior, judgment, thought content normal.   Labs on Admission:  Basic Metabolic Panel:  Recent Labs Lab 02/27/14 1326  NA 134*  K 4.5  CL 95*  CO2 27  GLUCOSE 809*  BUN 12  CREATININE 1.02  CALCIUM 10.4   CBC:  Recent Labs Lab 02/27/14 1326  WBC 10.0  NEUTROABS 5.9  HGB 15.6*  HCT 47.2*  MCV 91.3  PLT 393   EKG: Normal sinus rhythm, no ST/T wave changes   If 7PM-7AM, please contact night-coverage [email or web address] Password [facility] 02/27/2014, 2:59 PM

## 2014-02-28 ENCOUNTER — Encounter (HOSPITAL_COMMUNITY): Payer: Self-pay | Admitting: Internal Medicine

## 2014-02-28 DIAGNOSIS — F418 Other specified anxiety disorders: Secondary | ICD-10-CM | POA: Diagnosis present

## 2014-02-28 DIAGNOSIS — Z87891 Personal history of nicotine dependence: Secondary | ICD-10-CM | POA: Diagnosis present

## 2014-02-28 DIAGNOSIS — R739 Hyperglycemia, unspecified: Secondary | ICD-10-CM

## 2014-02-28 DIAGNOSIS — Z720 Tobacco use: Secondary | ICD-10-CM

## 2014-02-28 DIAGNOSIS — E119 Type 2 diabetes mellitus without complications: Secondary | ICD-10-CM

## 2014-02-28 DIAGNOSIS — F1011 Alcohol abuse, in remission: Secondary | ICD-10-CM | POA: Diagnosis present

## 2014-02-28 DIAGNOSIS — K5090 Crohn's disease, unspecified, without complications: Secondary | ICD-10-CM | POA: Diagnosis present

## 2014-02-28 DIAGNOSIS — B370 Candidal stomatitis: Secondary | ICD-10-CM | POA: Diagnosis present

## 2014-02-28 HISTORY — DX: Type 2 diabetes mellitus without complications: E11.9

## 2014-02-28 HISTORY — DX: Candidal stomatitis: B37.0

## 2014-02-28 LAB — BASIC METABOLIC PANEL
Anion gap: 5 (ref 5–15)
BUN: 10 mg/dL (ref 6–23)
CO2: 25 mmol/L (ref 19–32)
Calcium: 9.4 mg/dL (ref 8.4–10.5)
Chloride: 110 mmol/L (ref 96–112)
Creatinine, Ser: 0.67 mg/dL (ref 0.50–1.10)
GFR calc Af Amer: >90 mL/min (ref >90)
GFR calc non Af Amer: >90 mL/min (ref >90)
Glucose, Bld: 254 mg/dL — ABNORMAL HIGH (ref 70–99)
Potassium: 4 mmol/L (ref 3.5–5.1)
Sodium: 140 mmol/L (ref 135–145)

## 2014-02-28 LAB — CBC
HEMATOCRIT: 38.7 % (ref 36.0–46.0)
Hemoglobin: 12.5 g/dL (ref 12.0–15.0)
MCH: 29.4 pg (ref 26.0–34.0)
MCHC: 32.3 g/dL (ref 30.0–36.0)
MCV: 91.1 fL (ref 78.0–100.0)
PLATELETS: 348 10*3/uL (ref 150–400)
RBC: 4.25 MIL/uL (ref 3.87–5.11)
RDW: 12.9 % (ref 11.5–15.5)
WBC: 8.9 10*3/uL (ref 4.0–10.5)

## 2014-02-28 LAB — GLUCOSE, CAPILLARY
GLUCOSE-CAPILLARY: 311 mg/dL — AB (ref 70–99)
GLUCOSE-CAPILLARY: 218 mg/dL — AB (ref 70–99)
GLUCOSE-CAPILLARY: 175 mg/dL — AB (ref 70–99)
GLUCOSE-CAPILLARY: 60 mg/dL — AB (ref 70–99)
GLUCOSE-CAPILLARY: 359 mg/dL — AB (ref 70–99)

## 2014-02-28 MED ORDER — LIDOCAINE VISCOUS 2 % MT SOLN
10.0000 mL | OROMUCOSAL | Status: DC | PRN
  Filled 2014-02-28 (×2): qty 10

## 2014-02-28 MED ORDER — LIVING WELL WITH DIABETES BOOK
Freq: Once | Status: DC
  Filled 2014-02-28: qty 1

## 2014-02-28 MED ORDER — INSULIN ASPART 100 UNIT/ML ~~LOC~~ SOLN
0.0000 [IU] | Freq: Three times a day (TID) | SUBCUTANEOUS | Status: DC
  Administered 2014-02-28: 7 [IU] via SUBCUTANEOUS
  Administered 2014-02-28: 15 [IU] via SUBCUTANEOUS

## 2014-02-28 MED ORDER — INSULIN ASPART 100 UNIT/ML ~~LOC~~ SOLN
6.0000 [IU] | Freq: Three times a day (TID) | SUBCUTANEOUS | Status: DC
  Administered 2014-02-28 (×2): 6 [IU] via SUBCUTANEOUS

## 2014-02-28 MED ORDER — INSULIN GLARGINE 100 UNIT/ML ~~LOC~~ SOLN
20.0000 [IU] | Freq: Every day | SUBCUTANEOUS | Status: DC
  Administered 2014-02-28: 20 [IU] via SUBCUTANEOUS
  Filled 2014-02-28: qty 0.2

## 2014-02-28 MED ORDER — DEXTROSE 50 % IV SOLN
INTRAVENOUS | Status: AC
  Administered 2014-02-28: 25 mL
  Filled 2014-02-28: qty 50

## 2014-02-28 MED ORDER — INSULIN ASPART 100 UNIT/ML ~~LOC~~ SOLN
0.0000 [IU] | Freq: Every day | SUBCUTANEOUS | Status: DC
  Administered 2014-02-28: 5 [IU] via SUBCUTANEOUS

## 2014-02-28 NOTE — Care Management Note (Signed)
    Page 1 of 1   02/28/2014     1:12:40 PM CARE MANAGEMENT NOTE 02/28/2014  Patient:  Elizabeth Andrade, Elizabeth Andrade   Account Number:  [id number]  Date Initiated:  02/28/2014  Documentation initiated by:  Lanier Clam  Subjective/Objective Assessment:   54 y/o f admitted w/New onset DM,odynophagia.UJ:[id number]     Action/Plan:   From home.   Anticipated DC Date:  03/04/2014   Anticipated DC Plan:  HOME/SELF CARE      DC Planning Services  CM consult      Choice offered to / List presented to:             Status of service:  In process, will continue to follow Medicare Important Message given?   (If response is "NO", the following Medicare IM given date fields will be blank) Date Medicare IM given:   Medicare IM given by:   Date Additional Medicare IM given:   Additional Medicare IM given by:    Discharge Disposition:    Per UR Regulation:  Reviewed for med. necessity/level of care/duration of stay  If discussed at Long Length of Stay Meetings, dates discussed:    Comments:  02/28/14 Lanier Clam RN BSN NCM [phone] Await DM educ recommendations.

## 2014-02-28 NOTE — Plan of Care (Signed)
Problem: Food- and Nutrition-Related Knowledge Deficit (NB-1.1) Goal: Nutrition education Formal process to instruct or train a patient/client in a skill or to impart knowledge to help patients/clients voluntarily manage or modify food choices and eating behavior to maintain or improve health. Outcome: Completed/Met Date Met:  02/28/14  RD consulted for nutrition education regarding diabetes.   No results found for: HGBA1C  RD provided "Carbohydrate Counting for People with Diabetes" and "Diabetes Tips for Label Reading" handout from the Academy of [employer] and Dietetics and visual "Plate Method" handout. Discussed different food groups and their effects on blood sugar, emphasizing carbohydrate-containing foods. Provided list of carbohydrates and recommended serving sizes of common foods.  Discussed importance of controlled and consistent carbohydrate intake throughout the day. Provided examples of ways to balance meals/snacks and encouraged intake of high-fiber, whole grain complex carbohydrates. Teach back method used.  Expect good compliance.  Body mass index is 28.1 kg/(m^2). Pt meets criteria for overweight based on current BMI.  Current diet order is CHO modified, patient is consuming approximately 100% of meals at this time. Labs and medications reviewed. No further nutrition interventions warranted at this time. If additional nutrition issues arise, please re-consult RD.  Clayton Bibles, MS, RD, LDN Pager: [phone] After Hours Pager: [phone]

## 2014-02-28 NOTE — Progress Notes (Signed)
Progress Note   ALANEE TING ZOX:[id number] DOB: [birth year] DOA: 02/27/2014 PCP: Ancil Boozer, MD   Brief Narrative:   Elizabeth Andrade is an 54 y.o. female with a PMH of Crohn's disease, in remission on mesalamine who was admitted 02/27/14 with a chief complaint of pharyngitis with globus sensation associated with odynophagia.  She also reported polyuria and polydipsia. Upon initial evaluation in the ED, the patient's glucose was greater than 800 with evidence of oral candidiasis.  Assessment/Plan:   Principal Problem:   Diabetes mellitus, new onset / hyperglycemia  Hydrated and placed on 10 units of Lantus and insulin sensitive SSI. CBGs 271-471. Will increase Lantus to 20 units and change SSI to resistant scale with 6 units of meal coverage 3 times a day.  Diabetes coordinator consultation pending.  Follow-up hemoglobin A1c.  Active Problems:   Crohn's disease  Continue mesalamine. Not active.    Oropharyngeal candidiasis  Continue Diflucan, Magic mouthwash, and IV Protonix.    DVT Prophylaxis  Continue Lovenox.  Code Status: Full. Family Communication: No family at the bedside. Disposition Plan: Home when stable.   IV Access:    Peripheral IV   Procedures and diagnostic studies:   Dg Neck Soft Tissue 02/27/2014: Probable focus of segmental calcification of the stylohyoid ligament, seen just above the hyoid bone. However, if there is significant clinical concern for a foreign body, additional evaluation would be warranted. If this is a body at the base of the vallecular, may be visible on indirect laryngoscopy and would be visible with nasopharyngeal endoscopy. Exam otherwise unremarkable.    Dg Chest 2 View 02/27/2014: No active cardiopulmonary disease.   Medical Consultants:    None.  Anti-Infectives:    None.  Subjective:   Elizabeth Andrade complains of a sore throat this morning.  Less polydipsia and polyuria.  Some abdominal pain.  No nausea or  vomiting.  Bowels moved last night.    Objective:    Filed Vitals:   02/27/14 1429 02/27/14 1643 02/27/14 2108 02/28/14 0452  BP: 118/78 113/64 92/52 107/55  Pulse: 83 71 63 71  Temp:  98.2 F (36.8 C) 98.5 F (36.9 C) 98.7 F (37.1 C)  TempSrc:  Oral Oral Oral  Resp: Height:   (1.676 m)    Weight:  78.926 kg (174 lb)    SpO2: 100% 98% 98% 98%   No intake or output data in the 24 hours ending 02/28/14 0750  Exam: Gen:  NAD Oropharynx: No thrush observed. Cardiovascular:  RRR, No M/R/G Respiratory:  Lungs CTAB Gastrointestinal:  Abdomen soft, NT/ND, + BS Extremities:  No C/E/C   Data Reviewed:    Labs: Basic Metabolic Panel:  Recent Labs Lab 02/27/14 1326 02/27/14 1818 02/28/14 0449  NA 134* 143 140  K 4.5 4.1 4.0  CL 95* 110 110  CO2 GLUCOSE 809* 405* 254*  BUN CREATININE 1.02 0.73 0.67  CALCIUM 10.4 9.8 9.4  MG  --  1.9  --   PHOS  --  1.9*  --    GFR Estimated Creatinine Clearance: 86.1 mL/min (by C-G formula based on Cr of 0.67). Liver Function Tests:  Recent Labs Lab 02/27/14 1818  AST 20  ALT 24  ALKPHOS 131*  BILITOT 0.5  PROT 6.6  ALBUMIN 3.5   CBC:  Recent Labs Lab 02/27/14 1326 02/28/14 0540  WBC 10.0 8.9  NEUTROABS 5.9  --  HGB 15.6* 12.5  HCT 47.2* 38.7  MCV 91.3 91.1  PLT 393 348   CBG:  Recent Labs Lab 02/27/14 1544 02/27/14 1715 02/27/14 2039  GLUCAP 471* 271* 348*   Microbiology No results found for this or any previous visit (from the past 240 hour(s)).   Medications:   . enoxaparin (LOVENOX) injection  40 mg Subcutaneous Q24H  . ferrous sulfate  325 mg Oral Q breakfast  . fluconazole (DIFLUCAN) IV  100 mg Intravenous QHS  . insulin aspart  0-5 Units Subcutaneous QHS  . insulin aspart  0-9 Units Subcutaneous TID WC  . insulin aspart  3 Units Subcutaneous TID WC  . insulin glargine  10 Units Subcutaneous QHS  . mesalamine  1,200 mg Oral Q breakfast  .  pantoprazole (PROTONIX) IV  40 mg Intravenous Q12H   Continuous Infusions: . sodium chloride 100 mL/hr at 02/28/14 0230    Time spent: 35 minutes with > 50% of time discussing current diagnostic test results, clinical impression and plan of care.    LOS: 1 day   Elizabeth Andrade  [facility] Pager [phone]. If unable to reach me by pager, please call my cell phone at [phone].  *Please refer to [email or web address], password [email or web address] to get updated schedule on who will round on this patient, as hospitalists switch teams weekly. If 7PM-7AM, please contact night-coverage at [email or web address], password [email or web address] for any overnight needs.  02/28/2014, 7:50 AM

## 2014-02-28 NOTE — Progress Notes (Signed)
Inpatient Diabetes Program Recommendations  AACE/ADA: New Consensus Statement on Inpatient Glycemic Control (2013)  Target Ranges:  Prepandial:   less than 140 mg/dL      Peak postprandial:   less than 180 mg/dL (1-2 hours)      Critically ill patients:  140 - 180 mg/dL   Reason for Visit: Newly-diagnosed DM  Diabetes history: None Outpatient Diabetes medications: N/A Current orders for Inpatient glycemic control: Lantus 10 units QHS - Increased to 20 units  Inpatient Diabetes Program Recommendations Insulin - Basal: Increase Lantus to 15 units QHS Insulin - Meal Coverage: Increase Novolog by 1 unit tidwc if CBGs > 180 mg/dL HgbA1C: Pending Outpatient Referral: OP Diabetes Education consult for newly-diagnosed DM Diet: CHO mod med  Note: Spoke with pt about new diabetes diagnosis. Discussed basic pathophysiology of DM Type 2, basic home care, importance of checking CBGs and maintaining good CBG control to prevent long-term and short-term complications. Reviewed signs and symptoms of hyperglycemia and hypoglycemia along with treatment for both. RNs to provide ongoing basic DM education at bedside with this patient and engage patient to actively check blood glucose and administer insulin injections. Have ordered educational booklet, insulin starter kit, and DM videos.  Pt appears to be very motivated to control blood sugars at home. Interested in losing weight and starting an exercise program. Will need prescription for glucose meter and supplies, along with insulin pen needles.  Pt prefers insulin pen since she is working during the day and very active. Will teach insulin pen administration this afternoon. Has PCP ([facility]) although she admits to not going in past 2 years.  Discussed above with RN. Thank you. Lorenda Peck, RD, LDN, CDE Inpatient Diabetes Coordinator [phone]

## 2014-03-01 DIAGNOSIS — B3781 Candidal esophagitis: Secondary | ICD-10-CM

## 2014-03-01 DIAGNOSIS — F418 Other specified anxiety disorders: Secondary | ICD-10-CM

## 2014-03-01 LAB — GLUCOSE, CAPILLARY
GLUCOSE-CAPILLARY: 176 mg/dL — AB (ref 70–99)
Glucose-Capillary: 220 mg/dL — ABNORMAL HIGH (ref 70–99)
Glucose-Capillary: 258 mg/dL — ABNORMAL HIGH (ref 70–99)
Glucose-Capillary: 69 mg/dL — ABNORMAL LOW (ref 70–99)
Glucose-Capillary: 87 mg/dL (ref 70–99)
Glucose-Capillary: 290 mg/dL — ABNORMAL HIGH (ref 70–99)

## 2014-03-01 LAB — HEMOGLOBIN A1C
Hgb A1c MFr Bld: 12.3 % — ABNORMAL HIGH (ref 4.8–5.6)
MEAN PLASMA GLUCOSE: 306 mg/dL

## 2014-03-01 LAB — HIV ANTIBODY (ROUTINE TESTING W REFLEX): HIV Screen 4th Generation wRfx: NONREACTIVE

## 2014-03-01 MED ORDER — INSULIN ASPART 100 UNIT/ML ~~LOC~~ SOLN
0.0000 [IU] | Freq: Every day | SUBCUTANEOUS | Status: DC
  Administered 2014-03-01: 3 [IU] via SUBCUTANEOUS

## 2014-03-01 MED ORDER — INSULIN ASPART 100 UNIT/ML ~~LOC~~ SOLN
4.0000 [IU] | Freq: Three times a day (TID) | SUBCUTANEOUS | Status: DC
  Administered 2014-03-01 – 2014-03-02 (×4): 4 [IU] via SUBCUTANEOUS

## 2014-03-01 MED ORDER — PANTOPRAZOLE SODIUM 40 MG PO TBEC
40.0000 mg | DELAYED_RELEASE_TABLET | Freq: Every day | ORAL | Status: DC
  Administered 2014-03-01 – 2014-03-02 (×2): 40 mg via ORAL
  Filled 2014-03-01 (×3): qty 1

## 2014-03-01 MED ORDER — INSULIN GLARGINE 100 UNIT/ML ~~LOC~~ SOLN
15.0000 [IU] | Freq: Every day | SUBCUTANEOUS | Status: DC
  Administered 2014-03-01 – 2014-03-02 (×2): 15 [IU] via SUBCUTANEOUS
  Filled 2014-03-01 (×2): qty 0.15

## 2014-03-01 MED ORDER — INSULIN ASPART 100 UNIT/ML ~~LOC~~ SOLN
0.0000 [IU] | Freq: Three times a day (TID) | SUBCUTANEOUS | Status: DC
  Administered 2014-03-01: 3 [IU] via SUBCUTANEOUS
  Administered 2014-03-01: 5 [IU] via SUBCUTANEOUS
  Administered 2014-03-02: 3 [IU] via SUBCUTANEOUS
  Administered 2014-03-02: 8 [IU] via SUBCUTANEOUS

## 2014-03-01 MED ORDER — FLUCONAZOLE 100 MG PO TABS
100.0000 mg | ORAL_TABLET | Freq: Every day | ORAL | Status: DC
  Administered 2014-03-01: 100 mg via ORAL
  Filled 2014-03-01 (×2): qty 1

## 2014-03-01 MED ORDER — INSULIN STARTER KIT- PEN NEEDLES (ENGLISH)
1.0000 | Freq: Once | Status: AC
  Administered 2014-03-01: 1
  Filled 2014-03-01: qty 1

## 2014-03-01 NOTE — Progress Notes (Signed)
Inpatient Diabetes Program Recommendations  AACE/ADA: New Consensus Statement on Inpatient Glycemic Control (2013)  Target Ranges:  Prepandial:   less than 140 mg/dL      Peak postprandial:   less than 180 mg/dL (1-2 hours)      Critically ill patients:  140 - 180 mg/dL   Reason for Visit: Insulin Pen Teaching  Results for Elizabeth Andrade, Elizabeth Andrade (MRN [id number]) as of 03/01/2014 10:16  Ref. Range 02/28/2014 07:31 02/28/2014 11:48 02/28/2014 16:41 02/28/2014 17:12 02/28/2014 21:50  Glucose-Capillary Latest Range: 70-99 mg/dL 494 (H) 496 (H) 60 (L) 175 (H) 359 (H)   Hypoglycemia prior to dinner meal last night. Agree with decreasing Novolog to moderate tidwc and hs. Lantus lowered to 15 units QHS. Continue with Novolog 4 units if pt eats >50% meal.  Educated patient and spouse on insulin pen use at home. Reviewed all steps if insulin pen including attachment of needle, 2-unit air shot, dialing up dose, giving injection, removing needle, disposal of sharps, storage of unused insulin, disposal of insulin etc. Patient able to provide successful return demonstration. Also reviewed troubleshooting with insulin pen. MD to give patient Rxs for insulin pens and insulin pen needles. Discussed hypoglycemia s/s and treatment. Instructed to carry CHO source such as glucose tablets, hard candy, smarties, when exercising. At home, check blood sugars 3-4 times/day and record in logbook. Check sugars if feeling "funny," weak, silly shaky, to rule out hypoglycemia.  Will continue to follow. Thank you. Ailene Ards, RD, LDN, CDE Inpatient Diabetes Coordinator [phone]

## 2014-03-01 NOTE — Progress Notes (Signed)
appointment appointment   Progress Note   [name]:[id number] DOB: 06-Sep-1960 DOA: 02/27/2014 PCP: Ancil Boozer, MD   Brief Narrative:   Elizabeth Andrade is an 54 y.o. female with a PMH of Crohn's disease, in remission on mesalamine who was admitted 02/27/14 with a chief complaint of pharyngitis with globus sensation associated with odynophagia.  She also reported polyuria and polydipsia. Upon initial evaluation in the ED, the patient's glucose was greater than 800 with evidence of oral candidiasis.  Assessment/Plan:   Principal Problem:   Diabetes mellitus, new onset / hyperglycemia  Currently on Lantus 20 units daily with insulin resistant SSI and 6 units of meal coverage 3 times a day. CBGs 60-359. We'll decrease Lantus to 15 units and change SSI to moderate scale with 4 units of meal coverage 3 times a day.  Counseled by the diabetes coordinator and dietitian 02/28/14.  Hemoglobin A1c 12.3%, corresponding to a mean plasma glucose of 306.  Likely can D/C home tomorrow with prescriptions for insulin pens/needles, glucose meter. She will need to check her blood sugars 3-4 times a day and record in a log book. She will need close outpatient follow-up with her PCP. Will request secretaries make a follow-up appointment in place the appointment information on the AVS.  Active Problems:   Crohn's disease  Continue mesalamine. Not active.    Candida esophagitis  Continue Diflucan, Magic mouthwash, and Protonix.    DVT Prophylaxis  Continue Lovenox.  Code Status: Full. Family Communication:  Husband at the bedside. Disposition Plan: Home when stable.   IV Access:    Peripheral IV   Procedures and diagnostic studies:   Dg Neck Soft Tissue 02/27/2014: Probable focus of segmental calcification of the stylohyoid ligament, seen just above the hyoid bone. However, if there is significant clinical concern for a foreign body, additional evaluation would be warranted. If  this is a body at the base of the vallecular, may be visible on indirect laryngoscopy and would be visible with nasopharyngeal endoscopy. Exam otherwise unremarkable.    Dg Chest 2 View 02/27/2014: No active cardiopulmonary disease.   Medical Consultants:    None.  Anti-Infectives:    None.  Subjective:   Elizabeth Andrade  Feels nervous today. Sore throat is improving. No dyspnea, nausea or vomiting.  Objective:    Filed Vitals:   02/28/14 0452 02/28/14 1258 02/28/14 2017 03/01/14 0538  BP: 107/55 101/75 104/58 114/64  Pulse: 71 76 70 63  Temp: 98.7 F (37.1 C) 98.1 F (36.7 C) 98.6 F (37 C) 98.4 F (36.9 C)  TempSrc: Oral Oral Oral Oral  Resp: 18 18 18 18   Height:      Weight:      SpO2: 98% 100% 99% 100%    Intake/Output Summary (Last 24 hours) at 03/01/14 2035 Last data filed at 02/28/14 1259  Gross per 24 hour  Intake    480 ml  Output      0 ml  Net    480 ml    Exam: Gen:  NAD , anxious Oropharynx:  Clear. Cardiovascular:  RRR, No M/R/G Respiratory:  Lungs CTAB Gastrointestinal:  Abdomen soft, NT/ND, + BS Extremities:  No C/E/C   Data Reviewed:    Labs: Basic Metabolic Panel:  Recent Labs Lab 02/27/14 1326 02/27/14 1818 02/28/14 0449  NA 134* 143 140  K 4.5 4.1 4.0  CL 95* 110 110  CO2 27 24 25   GLUCOSE 809* 405* 254*  BUN 12 10 10  CREATININE 1.02 0.73 0.67  CALCIUM 10.4 9.8 9.4  MG  --  1.9  --   PHOS  --  1.9*  --    GFR Estimated Creatinine Clearance: 86.1 mL/min (by C-G formula based on Cr of 0.67). Liver Function Tests:  Recent Labs Lab 02/27/14 1818  AST 20  ALT 24  ALKPHOS 131*  BILITOT 0.5  PROT 6.6  ALBUMIN 3.5   CBC:  Recent Labs Lab 02/27/14 1326 02/28/14 0540  WBC 10.0 8.9  NEUTROABS 5.9  --   HGB 15.6* 12.5  HCT 47.2* 38.7  MCV 91.3 91.1  PLT 393 348   CBG:  Recent Labs Lab 02/28/14 0731 02/28/14 1148 02/28/14 1641 02/28/14 1712 02/28/14 2150  GLUCAP 311* 218* 60* 175* 359*    Microbiology No results found for this or any previous visit (from the past 240 hour(s)).   Medications:   . enoxaparin (LOVENOX) injection  40 mg Subcutaneous Q24H  . ferrous sulfate  325 mg Oral Q breakfast  . fluconazole (DIFLUCAN) IV  100 mg Intravenous QHS  . insulin aspart  0-20 Units Subcutaneous TID WC  . insulin aspart  0-5 Units Subcutaneous QHS  . insulin aspart  6 Units Subcutaneous TID WC  . insulin glargine  20 Units Subcutaneous QHS  . living well with diabetes book   Does not apply Once  . mesalamine  1,200 mg Oral Q breakfast  . pantoprazole (PROTONIX) IV  40 mg Intravenous Q12H   Continuous Infusions: . sodium chloride 100 mL/hr at 03/01/14 0041    Time spent: 35 minutes with > 50% of time discussing diabetes management and plan of care.    LOS: 2 days   Arlin Savona  [facility] Pager [phone]. If unable to reach me by pager, please call my cell phone at [phone].  *Please refer to [email or web address], password [email or web address] to get updated schedule on who will round on this patient, as hospitalists switch teams weekly. If 7PM-7AM, please contact night-coverage at [email or web address], password [email or web address] for any overnight needs.  03/01/2014, 7:22 AM

## 2014-03-01 NOTE — Progress Notes (Signed)
Ms. Markman iv began leaking at 1600 and had to be removed. This RN, another oncology RN and IVTeam RN attempted to restart iv but without success. Patient's meds are all po / subcutaneous. MD notified of the inability to gain iv access.

## 2014-03-01 NOTE — Progress Notes (Signed)
PHARMACIST - PHYSICIAN COMMUNICATION DR:   Rama CONCERNING: Antibiotic IV to Oral Route Change Policy  RECOMMENDATION: This patient is receiving Diflucan by the intravenous route.  Based on criteria approved by the Pharmacy and Therapeutics Committee, the antibiotic(s) is/are being converted to the equivalent oral dose form(s).   DESCRIPTION: These criteria include:  Patient being treated for a respiratory tract infection, urinary tract infection, cellulitis or clostridium difficile associated diarrhea if on metronidazole  The patient is not neutropenic and does not exhibit a GI malabsorption state  The patient is eating (either orally or via tube) and/or has been taking other orally administered medications for a least 24 hours  The patient is improving clinically and has a Tmax < 100.5  If you have questions about this conversion, please contact the Pharmacy Department    [phone] )  [facility]   [phone] )  [facility]    [phone] )  [facility]   [phone] )  [facility]     Hessie Knows, PharmD, [place] Pager [phone] 03/01/2014 8:42 AM

## 2014-03-01 NOTE — Progress Notes (Signed)
Hypoglycemic Event  CBG: 69  Treatment: 4 oz orange juice  Symptoms: asymptomatic  Follow-up CBG: Time:1728 CBG Result:87  Possible Reasons for Event: new to insulin  Comments/MD notified:Dr. Constance Haw, Darrick Penna  Remember to initiate Hypoglycemia Order Set & complete

## 2014-03-01 NOTE — Clinical Documentation Improvement (Signed)
Please specify diagnosis related to below supporting information, if appropriate.    Possible Clinical Conditions? _______ Candida espophagitis  _______ Candida unspecified _______Other Condition__________________ _______Cannot Clinically Determine   Supporting Information: Per 02/27/14 ED provider note= I spoke with Dr. Christella Hartigan on for gastroenterology who states that her symptoms are most likely due to her candida esophagitis.   PROGRESS 02/28/2014    Upon initial evaluation in the ED, the patient's glucose was greater than 800 with evidence of oral candidiasis. Active Problems:  Oropharyngeal candidiasis    Thank You, Shelda Pal ,RN Clinical Documentation Specialist:  [phone]  [facility]- Health Information Management

## 2014-03-02 LAB — GLUCOSE, CAPILLARY
GLUCOSE-CAPILLARY: 252 mg/dL — AB (ref 70–99)
Glucose-Capillary: 188 mg/dL — ABNORMAL HIGH (ref 70–99)

## 2014-03-02 MED ORDER — FLUCONAZOLE 100 MG PO TABS: 100.0000 mg | ORAL_TABLET | Freq: Every day | ORAL | Status: DC

## 2014-03-02 MED ORDER — INSULIN GLARGINE 100 UNIT/ML SOLOSTAR PEN: 15.0000 [IU] | PEN_INJECTOR | Freq: Every day | SUBCUTANEOUS | Status: DC

## 2014-03-02 MED ORDER — INSULIN GLARGINE 100 UNIT/ML ~~LOC~~ SOLN
15.0000 [IU] | Freq: Once | SUBCUTANEOUS | Status: AC
  Filled 2014-03-02: qty 0.15

## 2014-03-02 NOTE — Progress Notes (Signed)
Patient d/c home,ambulatory,family presnt on d/c. Stable.In good spirits.- Hulda Marin RN

## 2014-03-02 NOTE — Care Management Note (Signed)
CARE MANAGEMENT NOTE 03/02/2014  Patient:  Elizabeth Andrade, Elizabeth Andrade   Account Number:  [id number]  Date Initiated:  02/28/2014  Documentation initiated by:  Dessa Phi  Subjective/Objective Assessment:   54 y/o f admitted w/New onset DM,odynophagia.OK:[id number]     Action/Plan:   From home.   Anticipated DC Date:  03/04/2014   Anticipated DC Plan:  [place]  CM consult      Choice offered to / List presented to:             Status of service:  In process, will continue to follow Medicare Important Message given?   (If response is "NO", the following Medicare IM given date fields will be blank) Date Medicare IM given:   Medicare IM given by:   Date Additional Medicare IM given:   Additional Medicare IM given by:    Discharge Disposition:    Per UR Regulation:  Reviewed for med. necessity/level of care/duration of stay  If discussed at [place] of Stay Meetings, dates discussed:    Comments:  03/02/14 Marney Doctor RN,BSN,NCM [phone] RN alerted this CM that pt states she doesn't have any money to get her insulin or glucose meter until tomorrow. Spoke with pt at bedside about DC plan.  Pt only to receive Lantus once daily at home.  Spoke with pharmacy who states pt can get dose early today prior to being DC'd to cover her for today.  RN to follow up with MD about this.  Per benefits check pt should only have to pay $16 copay for [employer] brand new diabetic kit (glucometer,lancets and test strips) with her insurance. Pt states she can ask her mother to borrow the money for the kit. Pts pharmacy called to confirm that they have that specific brand.   No other CM needs noted.  02/28/14 Dessa Phi RN BSN NCM [phone] Await DM educ recommendations.

## 2014-03-02 NOTE — Discharge Summary (Signed)
Physician Discharge Summary  Elizabeth Andrade:[id number] DOB: 15-Apr-1960 DOA: 02/27/2014  PCP: Ancil Boozer, MD  Admit date: 02/27/2014 Discharge date: 03/02/2014  Time spent: > 35 minutes  Recommendations for Outpatient Follow-up:  1. Please see discharge instructions below 2. Monitor patient's blood sugars and adjust hypoglycemic agent as necessary  Discharge Diagnoses:  Principal Problem:   Diabetes mellitus, new onset Active Problems:   Hyperglycemia   Crohn's disease   Depression with anxiety   History of alcohol abuse   Tobacco abuse   Candida esophagitis   Discharge Condition: stable  Diet recommendation: Diabetic  Filed Weights   02/27/14 1643  Weight: 78.926 kg (174 lb)    History of present illness:  From original HPI: 54 year old female with a past medical history of Crohn's disease, in remission and on mesalamine, presented to [facility] ED with main concern of almost one week duration of persistent throat pain, globus sensation, associated with pain and difficulty with swallowing. She also reports polydipsia but can not get hydrated enough.Her throat is now coated with white patches and she stili feels very dehydrated. Pt denies chest pain or shortness of breath, no abd concerns, no similar events in the past. She also denies fevers, chills, no blood in stool or urine.   Hospital Course:  New onset DM - Will discharge with script for glucometer, lancets, strips, and Lantus solostar pen - Pt indicated to monitor her blood sugars 3 times daily - Pt recommended to follow up with pcp for further evaluation for her diabetes   Oropharyngeal candidiasis - Will provide script for diflucan for 12 more days.  - On pcp follow up may decide to shorten or prolong therapy based on patient's clinical status  Procedures:  None  Consultations:  none  Discharge Exam: Filed Vitals:   03/02/14 0527  BP: 105/50  Pulse: 60  Temp: 98.1 F (36.7 C)  Resp: 18    General:  Pt in nad, alert and awake Cardiovascular: rrr, no mrg Respiratory: cta bl, no wheezes  Discharge Instructions   Discharge Instructions    Call MD for:  difficulty breathing, headache or visual disturbances    Complete by:  As directed      Call MD for:  severe uncontrolled pain    Complete by:  As directed      Call MD for:  temperature >100.4    Complete by:  As directed      Diet - low sodium heart healthy    Complete by:  As directed      Discharge instructions    Complete by:  As directed   Please assess whether patient will require prolonged antifungal regimen for her oral candidiasis. Patient will be instructed to check her blood sugars 3-4 times a day and document so that she can present to the primary care physician. We'll discharge with prescription for glucometer meter and strips and lancets.     Increase activity slowly    Complete by:  As directed           Current Discharge Medication List    START taking these medications   Details  fluconazole (DIFLUCAN) 100 MG tablet Take 1 tablet (100 mg total) by mouth at bedtime. Qty: 12 tablet, Refills: 0    Insulin Glargine (LANTUS SOLOSTAR) 100 UNIT/ML Solostar Pen Inject 15 Units into the skin daily at 10 pm. Qty: 15 mL, Refills: 11      CONTINUE these medications which have NOT CHANGED   Details  acetaminophen (TYLENOL) 500 MG tablet Take 1,000 mg by mouth every 6 (six) hours as needed. For pain    cholecalciferol (VITAMIN D) 1000 UNITS tablet Take 5,000 Units by mouth daily.     esomeprazole (NEXIUM) 40 MG capsule Take 1 capsule (40 mg total) by mouth daily. Qty: 30 capsule, Refills: 0    ferrous sulfate 325 (65 FE) MG tablet Take 325 mg by mouth daily with breakfast.      lidocaine (XYLOCAINE) 2 % solution Use as directed 20 mLs in the mouth or throat 4 (four) times daily as needed for mouth pain (swallow). Qty: 100 mL, Refills: 1    mesalamine (LIALDA) 1.2 G EC tablet Take 1 tablet (1.2 g total) by mouth  daily with breakfast. Qty: 30 tablet, Refills: 1    Multiple Vitamin (MULTIVITAMIN) tablet Take 1 tablet by mouth daily.      omega-3 acid ethyl esters (LOVAZA) 1 G capsule Take 1 g by mouth daily.      STOP taking these medications     oseltamivir (TAMIFLU) 75 MG capsule        No Known Allergies Follow-up Information    Follow up with [facility]. Go on 03/07/2014.   Why:  Appointment with Dr Ermalinda Memos at 9:30am, be there at 9:15am for paperwork   Contact information:   [address] [place] [address] [phone]       The results of significant diagnostics from this hospitalization (including imaging, microbiology, ancillary and laboratory) are listed below for reference.    Significant Diagnostic Studies: Dg Neck Soft Tissue  02/27/2014   CLINICAL DATA:  Feels that something is stuck in throat. Pt was seen on the 26th of this month for same symptoms, pt states she feels as if she has a lump in throat. Pt states she needs some one to do something, draw blood or xray because she is so sick. Pt states she thinks she has pneumonia. Pt denies SOB, chest pain but states she feel she can just vomit up everything. Denies difficulty swallowing but states she has a nasty taste in mouth and feels as if something is moving in chest every time she swallows. Current smoker. States she was previously given medication for reflux.  EXAM: NECK SOFT TISSUES - 1+ VIEW  COMPARISON:  None.  FINDINGS: There is an elongated focus of what appears be calcification just above the hyoid most likely segmental calcification of the stylohyoid ligament. A radiopaque foreign body such as a bone is not completely excluded but felt unlikely.  Soft tissues otherwise unremarkable. Airway is widely patent. No significant bony abnormality.  IMPRESSION: Probable focus of segmental calcification of the stylohyoid ligament, seen just above the hyoid bone. However, if there is significant  clinical concern for a foreign body, additional evaluation would be warranted. If this is a body at the base of the vallecular, may be visible on indirect laryngoscopy and would be visible with nasopharyngeal endoscopy. Exam otherwise unremarkable.   Electronically Signed   By: Amie Portland M.D.   On: 02/27/2014 13:57   Dg Chest 2 View  02/27/2014   CLINICAL DATA:  Chest discomfort. Pt denies SOB, chest pain, or couch but states she feel she can just vomit up everything. Feels that something is stuck in throat. Pt was seen on the 26th of this month for same symptoms, pt states she feels as if she has a lump in throat. Pt states she thinks she has  pneumonia. Denies difficulty swallowing but states she has a nasty taste in mouth and feels as if something is moving in chest every time she swallows. Current smoker. States she was previously given medication for reflux. Nondiabetic. Non-HTN.  EXAM: CHEST  2 VIEW  COMPARISON:  None.  FINDINGS: Normal heart, mediastinum and hila.  Lungs are clear.  No pleural effusion or pneumothorax.  Bony thorax is unremarkable.  Clips are upper quadrant reflect a previous cholecystectomy.  IMPRESSION: No active cardiopulmonary disease.   Electronically Signed   By: Amie Portland M.D.   On: 02/27/2014 14:12    Microbiology: No results found for this or any previous visit (from the past 240 hour(s)).   Labs: Basic Metabolic Panel:  Recent Labs Lab 02/27/14 1326 02/27/14 1818 02/28/14 0449  NA 134* 143 140  K 4.5 4.1 4.0  CL 95* 110 110  CO2 GLUCOSE 809* 405* 254*  BUN CREATININE 1.02 0.73 0.67  CALCIUM 10.4 9.8 9.4  MG  --  1.9  --   PHOS  --  1.9*  --    Liver Function Tests:  Recent Labs Lab 02/27/14 1818  AST 20  ALT 24  ALKPHOS 131*  BILITOT 0.5  PROT 6.6  ALBUMIN 3.5   No results for input(s): LIPASE, AMYLASE in the last 168 hours. No results for input(s): AMMONIA in the last 168 hours. CBC:  Recent Labs Lab  02/27/14 1326 02/28/14 0540  WBC 10.0 8.9  NEUTROABS 5.9  --   HGB 15.6* 12.5  HCT 47.2* 38.7  MCV 91.3 91.1  PLT 393 348   Cardiac Enzymes: No results for input(s): CKTOTAL, CKMB, CKMBINDEX, TROPONINI in the last 168 hours. BNP: BNP (last 3 results) No results for input(s): BNP in the last 8760 hours.  ProBNP (last 3 results) No results for input(s): PROBNP in the last 8760 hours.  CBG:  Recent Labs Lab 03/01/14 1651 03/01/14 1728 03/01/14 2205 03/02/14 0730 03/02/14 1202  GLUCAP 69* 87 290* 252* 188*       Signed:  Penny Pia  [facility] 03/02/2014, 1:24 PM

## 2014-03-02 NOTE — Progress Notes (Signed)
Patient will not be able to obtain Lantus Insulin until tomorrow.  Lantus is due at bedtime.  Dr. Cena Benton notified and order given to give Lantus now prior to discharge home.  Elizabeth Andrade [facility]  03/02/2014  1:57 PM

## 2014-03-02 NOTE — Progress Notes (Signed)
Patient's d/c instructions given. Handouts re: insulin pen discussed with the patient, verbalized understanding. I administered her Lantus 15 units as ordered . Patient is stable to be discharged.No c/o pain. Ambulatory. Alert and orientedx4.

## 2014-03-07 ENCOUNTER — Ambulatory Visit (INDEPENDENT_AMBULATORY_CARE_PROVIDER_SITE_OTHER): Payer: BC Managed Care – PPO | Admitting: Family Medicine

## 2014-03-07 ENCOUNTER — Encounter: Payer: Self-pay | Admitting: Family Medicine

## 2014-03-07 VITALS — BP 109/70 | HR 65 | Temp 98.3°F | Ht 66.0 in | Wt 168.0 lb

## 2014-03-07 DIAGNOSIS — Z720 Tobacco use: Secondary | ICD-10-CM | POA: Diagnosis not present

## 2014-03-07 DIAGNOSIS — E119 Type 2 diabetes mellitus without complications: Secondary | ICD-10-CM | POA: Diagnosis not present

## 2014-03-07 DIAGNOSIS — K50919 Crohn's disease, unspecified, with unspecified complications: Secondary | ICD-10-CM | POA: Diagnosis not present

## 2014-03-07 MED ORDER — METFORMIN HCL 500 MG PO TABS: 500.0000 mg | ORAL_TABLET | Freq: Two times a day (BID) | ORAL | Status: DC

## 2014-03-07 NOTE — Assessment & Plan Note (Signed)
Uncontrolled, new diagnosis with recent hospitalization and candidal esophagitis Titrate Lantus, currently on 15 units, go up by 2 units per day to passing blood sugar of 150-200 Start metformin 500 twice a day, has Crohn's disease that she is very nervous about GI side effects Follow-up 4 weeks

## 2014-03-07 NOTE — Assessment & Plan Note (Addendum)
Contemplative Offered support, she declines Follow-up 4 weeks Would like to focus on diabetic control first, will encourage after control stabilizes

## 2014-03-07 NOTE — Progress Notes (Signed)
Patient ID: ANE METHOD, female   DOB: 1961/01/26, 54 y.o.   MRN: [id number]   HPI  Patient presents today to establish care and for hospital follow-up  She was admitted earlier this month with esophageal candidiasis and new-onset diabetes. She was started on Diflucan orally for her candidiasis, she is also started on Lantus for her diabetes.  She is Crohn's disease and states this is in remission.  Diabetes Taking 15 units of Lantus daily, fasting blood sugars now 275-325 Watching her carbohydrate intake very closely Not exercising formally yet  Her past medical, surgical, family, and social history was reviewed in detail and updated in relevant portions of EMR  Smoking status noted ROS: Per HPI  Objective: BP 109/70 mmHg  Pulse 65  Temp(Src) 98.3 F (36.8 C) (Oral)  Ht 5\' 6"  (1.676 m)  Wt 168 lb (76.204 kg)  BMI 27.13 kg/m2 Gen: NAD, alert, cooperative with exam HEENT: NCAT CV: RRR, good S1/S2, no murmur Resp: CTABL, no wheezes, non-labored Ext: No edema, warm Neuro: Alert and oriented, No gross deficits  Assessment and plan:  Diabetes mellitus, new onset Uncontrolled, new diagnosis with recent hospitalization and candidal esophagitis Titrate Lantus, currently on 15 units, go up by 2 units per day to passing blood sugar of 150-200 Start metformin 500 twice a day, has Crohn's disease that she is very nervous about GI side effects Follow-up 4 weeks   Tobacco abuse Contemplative Offered support, she declines Follow-up 4 weeks Would like to focus on diabetic control first, will encourage after control stabilizes   Crohn's disease This is in edition Continue to follow see's GI- Dr Tawnya Crook at [facility]     Orders Placed This Encounter  Procedures  . Ambulatory referral to diabetic education    Referral Priority:  Routine    Referral Type:  Consultation    Referral Reason:  Specialty Services Required    Number of Visits Requested:  1    Meds ordered this  encounter  Medications  . metFORMIN (GLUCOPHAGE) 500 MG tablet    Sig: Take 1 tablet (500 mg total) by mouth 2 (two) times daily with a meal.    Dispense:  60 tablet    Refill:  1

## 2014-03-07 NOTE — Patient Instructions (Signed)
Great to meet you!  Come back in 4 weeks, please record your fasting blood sugars  Increase your lantus by 2 units daily until your fasting blood sugar is 150-200, stop at 30 units.   Diet Recommendations for Diabetes   Starchy (carb) foods include: Bread, rice, pasta, potatoes, corn, crackers, bagels, muffins, all baked goods.   Protein foods include: Meat, fish, poultry, eggs, dairy foods, and beans such as pinto and kidney beans (beans also provide carbohydrate).   1. Eat at least 3 meals and 1-2 snacks per day. Never go more than 4-5 hours while awake without eating.  2. Limit starchy foods to TWO per meal and ONE per snack. ONE portion of a starchy  food is equal to the following:   - ONE slice of bread (or its equivalent, such as half of a hamburger bun).   - 1/2 cup of a "scoopable" starchy food such as potatoes or rice.   - 1 OUNCE (28 grams) of starchy snack foods such as crackers or pretzels (look on label).   - 15 grams of carbohydrate as shown on food label.  3. Both lunch and dinner should include a protein food, a carb food, and vegetables.   - Obtain twice as many veg's as protein or carbohydrate foods for both lunch and dinner.   - Try to keep frozen veg's on hand for a quick vegetable serving.     - Fresh or frozen veg's are best.  4. Breakfast should always include protein.

## 2014-03-07 NOTE — Assessment & Plan Note (Signed)
This is in edition Continue to follow see's GI- Dr Tawnya Crook at [facility]

## 2014-03-29 ENCOUNTER — Ambulatory Visit (INDEPENDENT_AMBULATORY_CARE_PROVIDER_SITE_OTHER): Payer: BC Managed Care – PPO | Admitting: Family Medicine

## 2014-03-29 ENCOUNTER — Encounter: Payer: Self-pay | Admitting: Family Medicine

## 2014-03-29 VITALS — BP 107/69 | HR 78 | Temp 98.4°F | Ht 66.0 in | Wt 163.9 lb

## 2014-03-29 DIAGNOSIS — Z0000 Encounter for general adult medical examination without abnormal findings: Secondary | ICD-10-CM

## 2014-03-29 DIAGNOSIS — B3781 Candidal esophagitis: Secondary | ICD-10-CM

## 2014-03-29 DIAGNOSIS — E119 Type 2 diabetes mellitus without complications: Secondary | ICD-10-CM

## 2014-03-29 DIAGNOSIS — Z124 Encounter for screening for malignant neoplasm of cervix: Secondary | ICD-10-CM | POA: Insufficient documentation

## 2014-03-29 MED ORDER — METFORMIN HCL 1000 MG PO TABS: 1000.0000 mg | ORAL_TABLET | Freq: Two times a day (BID) | ORAL | Status: DC

## 2014-03-29 NOTE — Assessment & Plan Note (Signed)
Recommend mammogram and given her information to schedule appointment  Offered Pneumovax but she declines

## 2014-03-29 NOTE — Assessment & Plan Note (Signed)
Resolved

## 2014-03-29 NOTE — Progress Notes (Signed)
Patient ID: Elizabeth Andrade, female   DOB: 01-25-1961, 54 y.o.   MRN: [id number]   HPI  Patient presents today for follow-up diabetes and esophageal candidiasis  Patient was admitted after being concerned about thrush when she was found to have severe hyperglycemia. She was started on Lantus and treated for her esophageal candidiasis and sent home.  Since our last visit her diabetic control is improved drastically, she's titrated her Lantus up to 29 units daily, her fasting blood sugars are now 77-109, when she was on 27 units of Lantus daily her blood sugar was 109- 160 Tolerating metformin well, she's had one episode of diarrhea since starting the medication as it was giving her GI upset Watching her diet She is not exercising yet but intends to.  Esophageal candidiasis Her GERD-like symptoms have improved, her thrush is resolved.  She is offered Pneumovax today, she declines She's just urinated so she is stable for urine microalbumin  Smoking status noted-current every day smoker ROS: Per HPI  Objective: BP 107/69 mmHg  Pulse 78  Temp(Src) 98.4 F (36.9 C) (Oral)  Ht 5\' 6"  (1.676 m)  Wt 163 lb 14.4 oz (74.345 kg)  BMI 26.47 kg/m2 Gen: NAD, alert, cooperative with exam HEENT: NCAT, MMM, no signs of thrush CV: RRR, good S1/S2, no murmur Resp: CTABL, no wheezes, non-labored Ext: No edema, warm Neuro: Alert and oriented, No gross deficits  Diabetic foot exam: 2+ DP pulses bilaterally, no lesions, sensation intact to monofilament throughout.  Assessment and plan:  Diabetes mellitus, new onset Control improving on metformin and Lantus Has titrated up to 29 units of Lantus and now with fasting blood sugars of 77- 107, decrease to 27 units daily Tolerating metformin well, increase to 1 g twice daily Follow-up 2 months, patient is keeping very good records encouraged her to continue this Next visit needs urine microalbumin, consider Pneumovax    Candida  esophagitis Resolved   Healthcare maintenance Recommend mammogram and given her information to schedule appointment  Offered Pneumovax but she declines       Meds ordered this encounter  Medications  . metFORMIN (GLUCOPHAGE) 1000 MG tablet    Sig: Take 1 tablet (1,000 mg total) by mouth 2 (two) times daily with a meal.    Dispense:  60 tablet    Refill:  11

## 2014-03-29 NOTE — Assessment & Plan Note (Signed)
Control improving on metformin and Lantus Has titrated up to 29 units of Lantus and now with fasting blood sugars of 77- 107, decrease to 27 units daily Tolerating metformin well, increase to 1 g twice daily Follow-up 2 months, patient is keeping very good records encouraged her to continue this Next visit needs urine microalbumin, consider Pneumovax

## 2014-03-29 NOTE — Patient Instructions (Signed)
Great to see you!  Your sugars are looking much better!  Lets increase metformin to 1000 mg twice daily, you can take 2 pills twice a day and then fill the higher dose at the pharmacy As you increase your metformin you may need to decrease your Lantus, just decrease by 2 units a day until you r blood sugars are consistently between 100-150. This will likely only be 2-4 units decrease.   Think about scheduling a mammogram  Lets follow up in 2 months  Diet Recommendations for Diabetes   Starchy (carb) foods include: Bread, rice, pasta, potatoes, corn, crackers, bagels, muffins, all baked goods.   Protein foods include: Meat, fish, poultry, eggs, dairy foods, and beans such as pinto and kidney beans (beans also provide carbohydrate).   1. Eat at least 3 meals and 1-2 snacks per day. Never go more than 4-5 hours while awake without eating.  2. Limit starchy foods to TWO per meal and ONE per snack. ONE portion of a starchy  food is equal to the following:   - ONE slice of bread (or its equivalent, such as half of a hamburger bun).   - 1/2 cup of a "scoopable" starchy food such as potatoes or rice.   - 1 OUNCE (28 grams) of starchy snack foods such as crackers or pretzels (look on label).   - 15 grams of carbohydrate as shown on food label.  3. Both lunch and dinner should include a protein food, a carb food, and vegetables.   - Obtain twice as many veg's as protein or carbohydrate foods for both lunch and dinner.   - Try to keep frozen veg's on hand for a quick vegetable serving.     - Fresh or frozen veg's are best.  4. Breakfast should always include protein.

## 2014-04-11 ENCOUNTER — Telehealth: Payer: Self-pay | Admitting: Family Medicine

## 2014-04-11 MED ORDER — METFORMIN HCL 500 MG PO TABS: 500.0000 mg | ORAL_TABLET | Freq: Two times a day (BID) | ORAL | Status: DC

## 2014-04-11 NOTE — Telephone Encounter (Signed)
Patient not  Increased metformin dose, back to 500 mg twice daily.  Murtis Sink, MD [facility] Resident, PGY-3 04/11/2014, 12:32 PM

## 2014-04-11 NOTE — Telephone Encounter (Signed)
Pt is taking metaformin 1000 mg but is giving her diarrhea.  Could she take less? Please advise

## 2014-04-11 NOTE — Telephone Encounter (Signed)
Patient informed, expressed understanding. 

## 2014-04-19 ENCOUNTER — Ambulatory Visit: Payer: BC Managed Care – PPO | Admitting: Gastroenterology

## 2014-04-19 ENCOUNTER — Ambulatory Visit (INDEPENDENT_AMBULATORY_CARE_PROVIDER_SITE_OTHER): Payer: BC Managed Care – PPO | Admitting: Gastroenterology

## 2014-04-19 ENCOUNTER — Encounter: Payer: Self-pay | Admitting: Gastroenterology

## 2014-04-19 VITALS — BP 114/74 | HR 68 | Ht 65.5 in | Wt 162.2 lb

## 2014-04-19 DIAGNOSIS — K5090 Crohn's disease, unspecified, without complications: Secondary | ICD-10-CM | POA: Diagnosis not present

## 2014-04-19 NOTE — Patient Instructions (Addendum)
You have been given a separate informational sheet regarding your tobacco use, the importance of quitting and local resources to help you quit.  Follow up as needed 

## 2014-04-19 NOTE — Progress Notes (Signed)
      History of Present Illness:  Ms. Brocious is here for follow-up of Crohn's disease.  She has Crohn's colitis and ileitis.  At colonoscopy last year a stricture in the sigmoid was seen.  Biopsies demonstrated an active colitis.  Atrophic changes in the terminal ileum.  Patient is on no medicines having discontinued azathioprine and lialda.  She has no GI complaints.    Review of Systems: Pertinent positive and negative review of systems were noted in the above HPI section. All other review of systems were otherwise negative.    Current Medications, Allergies, Past Medical History, Past Surgical History, Family History and Social History were reviewed in [employer] electronic medical record  Vital signs were reviewed in today's medical record. Physical Exam: General: Well developed , well nourished, no acute distress Skin: anicteric Head: Normocephalic and atraumatic Eyes:  sclerae anicteric, EOMI Ears: Normal auditory acuity Mouth: No deformity or lesions Lungs: Clear throughout to auscultation Heart: Regular rate and rhythm; no murmurs, rubs or bruits Abdomen: Soft, non tender and non distended. No masses, hepatosplenomegaly or hernias noted. Normal Bowel sounds Rectal:deferred Musculoskeletal: Symmetrical with no gross deformities  Pulses:  Normal pulses noted Extremities: No clubbing, cyanosis, edema or deformities noted Neurological: Alert oriented x 4, grossly nonfocal Psychological:  Alert and cooperative. Normal mood and affect  See Assessment and Plan under Problem List

## 2014-04-19 NOTE — Assessment & Plan Note (Signed)
She remains in clinical remission off all medications.  Recommendations #1 plan follow-up colonoscopy in 4 years (5 years since last colonoscopy)

## 2014-07-27 ENCOUNTER — Encounter: Payer: Self-pay | Admitting: Family Medicine

## 2014-07-27 ENCOUNTER — Ambulatory Visit (INDEPENDENT_AMBULATORY_CARE_PROVIDER_SITE_OTHER): Payer: BC Managed Care – PPO | Admitting: Family Medicine

## 2014-07-27 VITALS — BP 113/77 | HR 71 | Temp 98.1°F | Ht 66.0 in | Wt 157.0 lb

## 2014-07-27 DIAGNOSIS — E119 Type 2 diabetes mellitus without complications: Secondary | ICD-10-CM

## 2014-07-27 LAB — POCT GLYCOSYLATED HEMOGLOBIN (HGB A1C): HEMOGLOBIN A1C: 5.9

## 2014-07-27 NOTE — Patient Instructions (Signed)
It was a pleasure seeing you today in our clinic. Today we discussed your diabetes. Here is the treatment plan we have discussed and agreed upon together:   - I would like you to make sure to pick up your prescription of Lantus. Take this every day at the same time each day (I would prefer first thing in the morning). - Take 25 Units of Lantus once a day. - I would appreciate it if you could take and record your daily glucose levels 2-3 times a day for 1-2 weeks leading up to your next appointment.  - We discussed the symptoms of low blood sugar. If you experience any of these for have an episode of fainting or feelings of fainting and it does not improve with juice or a snack, then please be seen in our office or urgent care immediately.

## 2014-07-28 NOTE — Progress Notes (Signed)
   HPI  CC: DM2 Patient states she hasn't been very compliant w/ Lantus. Says her Lantus dosing was "too confusing" so she stopped taking it after she ran out. Patient hasn't picked up her refill yet.   DIABETES Type II Medication Compliance: no, reports good compliance w/ metformin but reports not taking Lantus for ~2wks Medication Side Effects: none reported Blood Sugar Ranges: unknown by patient Symptoms of Hypoglycemia: none   Comorbid Symptoms: no Chest pain; no SOB; no Neuropathy; no Vision problems  Health Maintenance Due  Topic Date Due  . PNEUMOCOCCAL POLYSACCHARIDE VACCINE (1) 08/14/1962  . FOOT EXAM  08/14/1970  . OPHTHALMOLOGY EXAM  08/14/1970  . URINE MICROALBUMIN  08/14/1970  . PAP SMEAR  08/28/2008  . MAMMOGRAM  08/14/2010  . TETANUS/TDAP  05/29/2014   ROS- Denies Polyuria,Polydipsia, nocturia, Vision changes, feet or hand numbness/pain/tingling. Denies Hypoglycemia symptoms (shaky, sweaty, hungry, weak anxious, tremor, palpitations, confusion, behavior change).   Hemoglobin a1c:  Lab Results  Component Value Date   HGBA1C 5.9 07/27/2014   HGBA1C 12.3* 02/27/2014    Past medical history and social history reviewed and updated in the EMR as appropriate.  Objective: BP 113/77 mmHg  Pulse 71  Temp(Src) 98.1 F (36.7 C) (Oral)  Ht  (1.676 m)  Wt 157 lb (71.215 kg)  BMI 25.35 kg/m2 Gen: NAD, alert, cooperative, and pleasant. HEENT: NCAT, EOMI, PERRL CV: RRR, no murmur Resp: CTAB, no wheezes, non-labored Ext: No edema, warm Neuro: Alert and oriented, Speech clear, No gross deficits  Assessment and plan:  Diabetes mellitus, new onset Patient's A1c 5.9 today. Patient seems to have had fairly good control over her DM status over the past 3 months. However, she admits to not taking her Lantus for the past 2 wks and also not checking for CBGs at home. She says that she was taking "25-29 units" of Lantus when she stopped, but that the regimen changed and  was "too confusing". - she is to take 25U Lantus daily, preferably in the AM - signs/symptoms of hypoglycemia were discussed - she was asked to f/u in 3 months.    Orders Placed This Encounter  Procedures  . POCT HgB A1C    Kathee Delton, MD,MS,  PGY1 07/28/2014 11:15 AM

## 2014-07-28 NOTE — Assessment & Plan Note (Signed)
Patient's A1c 5.9 today. Patient seems to have had fairly good control over her DM status over the past 3 months. However, she admits to not taking her Lantus for the past 2 wks and also not checking for CBGs at home. She says that she was taking "25-29 units" of Lantus when she stopped, but that the regimen changed and was "too confusing". - she is to take 25U Lantus daily, preferably in the AM - signs/symptoms of hypoglycemia were discussed - she was asked to f/u in 3 months.

## 2014-08-29 ENCOUNTER — Telehealth: Payer: Self-pay | Admitting: Family Medicine

## 2014-08-29 NOTE — Telephone Encounter (Signed)
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

## 2014-08-29 NOTE — Telephone Encounter (Signed)
Called patient. No red flag sxs of HHS. Encouraged her to call office first thing tomorrow to see someone in hopes of getting sample of lantus. encouraged her to ask about Dr. Macky Lower availability. Patient stated her understanding and willingness to do so.  Unable to purchase LAntus 2/2 current unemployment.

## 2014-08-29 NOTE — Telephone Encounter (Signed)
Patient currently out of Lantus and hasnt taken it since last appointment. Currently with no job or insurance and rx costs $45 at pharmacy. Informed patient that we may have some samples to give her and that I would forward message to PCP to see if there is a cheaper option available. Patient uses [facility]

## 2014-08-29 NOTE — Telephone Encounter (Signed)
Family Medicine Emergency Line Telephone Note  Patient called after-hours emergency line reporting that she has been out of Lantus for >1 month.  She called clinic earlier today to report this problem but has not heard back from PCP.  She would like to know if I can give a free sample of Lantus to hold her over until she is able to afford her prescription.  Reports that recent CBG at home was 138.  Explained to patient that I am unable to give samples as the clinic is currently closed.  Offered to pass message along to PCP and soemone could call her tomorrow about getting Lantus.    Patient reports that her head is hurting and tylenol did not work so she knows that it must be elevated blood sugars.  Explained that it would be safe for blood sugar to be in 130s and this should not cause headache.  Offered that she could try resting, ice, NSAID for headache.  She reports that she may come to ED to get dose of insulin.  Again reiterated that blood sugar in 130s was not immediately dangerous and could wait until morning to discuss with PCP.  Discussed that patient could come to hospital to be evaluated if she was experiencing blood sugars >300 or change in mental status.  Shirlee Latch, MD, PGY-2  08/29/2014 6:23 PM

## 2014-08-29 NOTE — Telephone Encounter (Signed)
See other telephone note from today 

## 2014-08-30 ENCOUNTER — Ambulatory Visit (INDEPENDENT_AMBULATORY_CARE_PROVIDER_SITE_OTHER): Payer: BC Managed Care – PPO | Admitting: Family Medicine

## 2014-08-30 ENCOUNTER — Encounter: Payer: Self-pay | Admitting: Family Medicine

## 2014-08-30 VITALS — BP 121/67 | HR 82 | Temp 98.0°F | Ht 66.0 in | Wt 159.2 lb

## 2014-08-30 DIAGNOSIS — E119 Type 2 diabetes mellitus without complications: Secondary | ICD-10-CM | POA: Diagnosis not present

## 2014-08-30 DIAGNOSIS — R51 Headache: Secondary | ICD-10-CM

## 2014-08-30 DIAGNOSIS — R519 Headache, unspecified: Secondary | ICD-10-CM

## 2014-08-30 NOTE — Progress Notes (Signed)
Patient ID: Elizabeth Andrade, female   DOB: 03-Jan-1961, 54 y.o.   MRN: [id number]  HPI:  Pt presents for a same day appointment to discuss insulin sample.  Pt currently taking metformin for her diabetes. Has not taken any insulin since June. Prior to that time, was on lantus 25 units daily. Not able to afford insulin at present, so she would like a sample from our supply. Reports that she checked her sugar yesterday and it was around 130. This morning was similar. One other value around 90.  Last A1c on 07/27/14 was 5.9.   She is concerned that her blood sugar is elevated because she's had a bad headache since two days ago. Took some tylenol for it. It's improved today compared to yesterday. It is generalized, all over her head. No associated vision changes, vomiting, fever, neck stiffness, rash, tick exposures, or numbness/tingling/weakness. She states she "does not feel right".   ROS: See HPI  PMFSH: hx esophageal candidiasis, chron's disease, dep/anxiety, DM, tobacco abuse  PHYSICAL EXAM: BP 121/67 mmHg  Pulse 82  Temp(Src) 98 F (36.7 C) (Oral)  Ht 5\' 6"  (1.676 m)  Wt 159 lb 3.2 oz (72.213 kg)  BMI 25.71 kg/m2 Gen: NAD. Tearful about visit running late. HEENT: full ROM of neck. NCAT Heart: RRR no murmur Lungs: CTAB NWOB Neuro: cranial nerves II-XII tested and intact. Speech normal. Full strength bilat upper and lower ext. Normal FNF. Negative romberg. Patellar reflexes equal. Sensation intact over upper and lower extremities.  ASSESSMENT/PLAN:  Type 2 diabetes mellitus Currently just on metformin, unable to afford lantus. Previously on 25 units daily. I question whether she needs any insulin. Her last A1c was 5.9, and she is reporting sugars of 90-140 over the last couple of days. I certainly would not recommend she take any at this time with sugars that well controlled. I don't think her headache is related to her current blood sugar. See separate discussion of headache. Patient will  f/u with PCP for further management of diabetes.    Headache Normal neuro exam today. No red flags. Reassuring that it has improved since yesterday. Doubt any serious process going on. Offered labwork to patient to eval for any metabolic cause of her not feeling well. She stated that since the visit had run late, she would not be able to stay for labs. Orders for CBC and CMET entered as future so that she can schedule a lab appointment. Encouraged tylenol for headache, and to return if worsening or not improving.  FOLLOW UP: F/u with PCP for routine DM care F/u prn for headache Return for lab visit  [place] J. Pollie Meyer, MD [facility]

## 2014-08-30 NOTE — Patient Instructions (Signed)
I don't think you need insulin Please schedule a lab visit to check liver, kidneys, blood counts Follow up with Dr. Wende Mott for your diabetes  Be well, Dr. Pollie Meyer

## 2014-08-31 NOTE — Assessment & Plan Note (Signed)
Currently just on metformin, unable to afford lantus. Previously on 25 units daily. I question whether she needs any insulin. Her last A1c was 5.9, and she is reporting sugars of 90-140 over the last couple of days. I certainly would not recommend she take any at this time with sugars that well controlled. I don't think her headache is related to her current blood sugar. See separate discussion of headache. Patient will f/u with PCP for further management of diabetes.

## 2014-10-27 ENCOUNTER — Ambulatory Visit (INDEPENDENT_AMBULATORY_CARE_PROVIDER_SITE_OTHER): Payer: BC Managed Care – PPO | Admitting: Family Medicine

## 2014-10-27 ENCOUNTER — Encounter: Payer: Self-pay | Admitting: Family Medicine

## 2014-10-27 VITALS — BP 122/66 | HR 82 | Temp 98.7°F | Ht 65.0 in | Wt 164.0 lb

## 2014-10-27 DIAGNOSIS — R519 Headache, unspecified: Secondary | ICD-10-CM

## 2014-10-27 DIAGNOSIS — R51 Headache: Secondary | ICD-10-CM

## 2014-10-27 MED ORDER — KETOROLAC TROMETHAMINE 30 MG/ML IJ SOLN
30.0000 mg | Freq: Once | INTRAMUSCULAR | Status: AC
  Administered 2014-10-27: 30 mg via INTRAMUSCULAR

## 2014-10-27 MED ORDER — SODIUM CHLORIDE 0.9 % IV SOLN
125.0000 mg | Freq: Once | INTRAVENOUS | Status: AC
  Administered 2014-10-27: 130 mg via INTRAMUSCULAR

## 2014-10-27 NOTE — Progress Notes (Signed)
Patient ID: Elizabeth Andrade, female   DOB: 01/10/1961, 54 y.o.   MRN: [id number]  HPI:  This history was provided by the patient.  Patient states on 9/28 she began having generalized body aches and pains, headache and nausea.  Patient states she had one episode of diarrhea earlier today.  Patient states her head pain is located over frontal and maxillary sinuses.  Patient denies vomiting, nasal congestion, rhinorrhea, ear pain, cough, fever, sore throat, shortness of breath, dizziness, chest pain and abdominal pain.  Patient has taken Tylenol and Therflu to treat headache and body aches.  She stated the Theraflu relieved her body aches, but her head remains painful.  Patient has a PMH of DMII and Chrohn's disease, both of which she states are well managed.  Patient is a current everyday smoker.  ROS: See HPI.  PMFSH: DM II, Crohn's Diease  PHYSICAL EXAM: BP 122/66 mmHg  Pulse 82  Temp(Src) 98.7 F (37.1 C) (Oral)  Ht 5\' 5"  (1.651 m)  Wt 164 lb (74.39 kg)  BMI 27.29 kg/m2 Gen: Patient is a well appearing African American female in no apparent distress. HEENT: Normocephalic, atraumatic.  Conjunctiva pink and sclera white.  No otorrhea, TM intact and gray.  Nasal turbinates normal, no rhinorrhea noted.  Some increased pain with palpation to frontal sinuses, but patient denies increased pain to maxillary sinus.  Lymph nodes not palpable.  Oropharynx not edematous, no erythema. Heart: S1/S2, rate regular, no rub, gallop or murmur Lungs: Clear to auscultation in all lobes, no wheezing or crackles Neuro: PERRL 3 mm.  EOMI, no astigmatism.  Upper and lower extremity strength equal bilaterally.  No dysmetria, tongue midline, face symmetrical.  Light touch equal bilaterally.   Ext: MAE  ASSESSMENT/PLAN: - Viral URI Suspect this is viral in nature given lack of rhinorrhea and fever.  Patient states headache has been unresponsive to Tylenol.  Will offer Toradol and steroid injection in office and  encouraged patient to increase fluid intake and rest.  Also suggested taking Benadryl before sleeping tonight.    Health maintenance:  -Smoking cessation - Flu shot   FOLLOW UP: F/u in clinic in five days if not improved.  Nelly Rout, NP [facility]

## 2014-10-27 NOTE — Patient Instructions (Signed)
Your symptoms are due to a viral illness. Antibiotics will not help improve your symptoms, but the following will help you feel better while your body fights the virus.   Drink lots of water   Try Benadryl 25 mg once tonight  The best thing for headaches are sleep and hydration (water)  Wash your hands often to prevent spreading the virus

## 2015-02-09 ENCOUNTER — Ambulatory Visit (INDEPENDENT_AMBULATORY_CARE_PROVIDER_SITE_OTHER): Payer: BC Managed Care – PPO | Admitting: Family Medicine

## 2015-02-09 ENCOUNTER — Encounter: Payer: Self-pay | Admitting: Family Medicine

## 2015-02-09 VITALS — BP 119/92 | HR 92 | Temp 98.6°F | Wt 167.7 lb

## 2015-02-09 DIAGNOSIS — E119 Type 2 diabetes mellitus without complications: Secondary | ICD-10-CM | POA: Diagnosis not present

## 2015-02-09 LAB — GLUCOSE, CAPILLARY: Glucose-Capillary: 356 mg/dL — ABNORMAL HIGH (ref 65–99)

## 2015-02-09 LAB — POCT GLYCOSYLATED HEMOGLOBIN (HGB A1C): Hemoglobin A1C: 9.7

## 2015-02-09 MED ORDER — METFORMIN HCL ER 500 MG PO TB24: 1000.0000 mg | ORAL_TABLET | Freq: Every day | ORAL | Status: DC

## 2015-02-09 NOTE — Assessment & Plan Note (Addendum)
Long discussion about diet and diabetes management.  - Switch Metformin to XR formula to possible reduce GI side-effects (also has Crohn's)  qd x 1 week then inc to 1G qd - f/u with PCP in 3-4 weeks. Suspect could control her DM with diet and orals given last A1c - Advised to check BGs qam

## 2015-02-09 NOTE — Patient Instructions (Signed)
It was great seeing you today.   1. Take Metformin XR 500 mg once a day for 1 week then increase to 2 pills (1000mg ) once a day and stay at that dose.  2. Check your blood sugar every morning prior to eating.  3. Bring your blood sugar readings to your next doctor's appointment 4. Consider getting your flu shot at your next visit   Please bring all your medications to every doctors visit  Sign up for My Chart to have easy access to your labs results, and communication with your Primary care physician.  Next Appointment  Please make an appointment with Dr Wende Mott in 2-3 weeks for diabetes   If you have any questions or concerns before then, please call the clinic at [phone].  Take Care,   Dr Wenda Low

## 2015-02-09 NOTE — Progress Notes (Signed)
   Subjective:    Patient ID: Elizabeth Andrade, female    DOB: [birth year], 55 y.o.   MRN: [id number]  Seen for Same day visit for   CC: Elevated blood sugars  She is concerned that her blood sugars have been running in the 300-400s. Complains of increased thirst, urination, and some dizziness and blurry vision. She request to go back on insulin. Admits to diet noncompliance. Drink 1L of regular soda daily and eats lots of carbs and sweets.   Objective:  BP 119/92 mmHg  Pulse 92  Temp(Src) 98.6 F (37 C) (Oral)  Wt 167 lb 11.2 oz (76.068 kg)  General: NAD Cardiac: RRR, normal heart sounds, no murmurs. 2+ radial and PT pulses bilaterally Respiratory: CTAB, normal effort    Assessment & Plan:   Type 2 diabetes mellitus Long discussion about diet and diabetes management.  - Switch Metformin to XR formula to possible reduce GI side-effects (also has Crohn's) 500mg  qd x 1 week then inc to 1G qd - f/u with PCP in 3-4 weeks. Suspect could control her DM with diet and orals given last A1c - Advised to check BGs qam

## 2015-02-27 ENCOUNTER — Telehealth: Payer: Self-pay | Admitting: Family Medicine

## 2015-02-27 NOTE — Telephone Encounter (Signed)
Pt called and needs a refill on her test strips called in. jw

## 2015-02-28 ENCOUNTER — Other Ambulatory Visit: Payer: Self-pay | Admitting: Family Medicine

## 2015-02-28 NOTE — Telephone Encounter (Signed)
Order called in

## 2015-03-02 ENCOUNTER — Ambulatory Visit: Payer: BC Managed Care – PPO | Admitting: Family Medicine

## 2015-04-03 ENCOUNTER — Encounter: Payer: Self-pay | Admitting: Family Medicine

## 2015-04-03 ENCOUNTER — Ambulatory Visit (INDEPENDENT_AMBULATORY_CARE_PROVIDER_SITE_OTHER): Payer: BC Managed Care – PPO | Admitting: Family Medicine

## 2015-04-03 VITALS — BP 120/66 | HR 72 | Temp 98.2°F | Wt 161.5 lb

## 2015-04-03 DIAGNOSIS — E119 Type 2 diabetes mellitus without complications: Secondary | ICD-10-CM

## 2015-04-03 LAB — GLUCOSE, CAPILLARY: Glucose-Capillary: 82 mg/dL (ref 65–99)

## 2015-04-03 MED ORDER — METFORMIN HCL ER 500 MG PO TB24: 1000.0000 mg | ORAL_TABLET | Freq: Every day | ORAL | Status: DC

## 2015-04-03 NOTE — Patient Instructions (Signed)
It was a pleasure seeing you today in our clinic. Today we discussed your diabetes. Here is the treatment plan we have discussed and agreed upon together:   - Continue taking your metformin as prescribed previously. - I would like to see you back in 1-2 months. At that time we will draw labs to make sure that you are responding well to this medication regimen.

## 2015-04-03 NOTE — Progress Notes (Signed)
   HPI  CC: Diabetes follow-up Patient is here for diabetes follow-up. She states that she has been doing very well with her new medication regimen. She denies any symptoms of hypoglycemia at this time. She is very pleased with how she feels on her current medications. Patient does state that she has not been taking her CBGs at home. She states that the test strips of been too expensive, however she states that she recently learned of an affordable place to obtain these. She states that she plans on purchasing these test strips tomorrow when she is running errands. She has no other issues or complaints at this time.  ROS: Patient denies any headache, nausea, vomiting, dizziness, vision changes, abdominal pain, diarrhea, fever, chills.  CC, SH/smoking status, and VS noted  Objective: BP 120/66 mmHg  Pulse 72  Temp(Src) 98.2 F (36.8 C) (Oral)  Wt 161 lb 8 oz (73.256 kg) Gen: NAD, alert, cooperative, and pleasant. CV: RRR, no murmur Resp: CTAB, no wheezes, non-labored Abd: SNTND, BS present, no guarding or organomegaly  Assessment and plan:  Type 2 diabetes mellitus Stable: Patient is tolerating her medication regimen well. At this time I have no data showing improvement in her CBGs as patient has not been taking them at home. We discussed that she would be picking up her test strips tomorrow and she plans on using them at least once a day. - CBG in clinic today yielded a blood glucose of 82. - I've asked patient to follow-up in our office in 1-2 months. At that time I will obtain a repeat A1c and obtain a BMP. Patient is also due for multiple health maintenance labs/exams. At her follow-up visit I will do my best check most of these as time allows. - Continue metformin regimen.    Orders Placed This Encounter  Procedures  . Glucose, capillary  . POCT glucose (manual entry)    Kathee Delton, MD,MS,  PGY2 04/03/2015 5:17 PM

## 2015-04-03 NOTE — Assessment & Plan Note (Signed)
Stable: Patient is tolerating her medication regimen well. At this time I have no data showing improvement in her CBGs as patient has not been taking them at home. We discussed that she would be picking up her test strips tomorrow and she plans on using them at least once a day. - CBG in clinic today yielded a blood glucose of 82. - I've asked patient to follow-up in our office in 1-2 months. At that time I will obtain a repeat A1c and obtain a BMP. Patient is also due for multiple health maintenance labs/exams. At her follow-up visit I will do my best check most of these as time allows. - Continue metformin regimen.

## 2015-04-25 ENCOUNTER — Other Ambulatory Visit: Payer: Self-pay | Admitting: *Deleted

## 2015-04-25 DIAGNOSIS — E119 Type 2 diabetes mellitus without complications: Secondary | ICD-10-CM

## 2015-04-26 ENCOUNTER — Emergency Department (HOSPITAL_COMMUNITY)
Admission: EM | Admit: 2015-04-26 | Discharge: 2015-04-26 | Disposition: A | Discharge status: Home / Self Care | Payer: BC Managed Care – PPO | Attending: Emergency Medicine | Admitting: Emergency Medicine

## 2015-04-26 ENCOUNTER — Encounter (HOSPITAL_COMMUNITY): Payer: Self-pay | Admitting: Emergency Medicine

## 2015-04-26 DIAGNOSIS — D649 Anemia, unspecified: Secondary | ICD-10-CM | POA: Insufficient documentation

## 2015-04-26 DIAGNOSIS — Z8619 Personal history of other infectious and parasitic diseases: Secondary | ICD-10-CM | POA: Insufficient documentation

## 2015-04-26 DIAGNOSIS — Z8719 Personal history of other diseases of the digestive system: Secondary | ICD-10-CM | POA: Insufficient documentation

## 2015-04-26 DIAGNOSIS — R112 Nausea with vomiting, unspecified: Secondary | ICD-10-CM | POA: Diagnosis not present

## 2015-04-26 DIAGNOSIS — F17210 Nicotine dependence, cigarettes, uncomplicated: Secondary | ICD-10-CM | POA: Insufficient documentation

## 2015-04-26 DIAGNOSIS — R109 Unspecified abdominal pain: Secondary | ICD-10-CM | POA: Insufficient documentation

## 2015-04-26 DIAGNOSIS — E119 Type 2 diabetes mellitus without complications: Secondary | ICD-10-CM | POA: Diagnosis not present

## 2015-04-26 DIAGNOSIS — R197 Diarrhea, unspecified: Secondary | ICD-10-CM | POA: Diagnosis present

## 2015-04-26 DIAGNOSIS — Z79899 Other long term (current) drug therapy: Secondary | ICD-10-CM | POA: Diagnosis not present

## 2015-04-26 DIAGNOSIS — Z7984 Long term (current) use of oral hypoglycemic drugs: Secondary | ICD-10-CM | POA: Insufficient documentation

## 2015-04-26 HISTORY — DX: Type 2 diabetes mellitus without complications: E11.9

## 2015-04-26 LAB — COMPREHENSIVE METABOLIC PANEL
ALK PHOS: 94 U/L (ref 38–126)
ALT: 15 U/L (ref 14–54)
ANION GAP: 9 (ref 5–15)
AST: 17 U/L (ref 15–41)
Albumin: 4 g/dL (ref 3.5–5.0)
BILIRUBIN TOTAL: 0.9 mg/dL (ref 0.3–1.2)
BUN: 11 mg/dL (ref 6–20)
CALCIUM: 10.9 mg/dL — AB (ref 8.9–10.3)
CO2: 25 mmol/L (ref 22–32)
Chloride: 108 mmol/L (ref 101–111)
Creatinine, Ser: 0.77 mg/dL (ref 0.44–1.00)
GFR calc non Af Amer: >60 mL/min (ref >60)
Glucose, Bld: 131 mg/dL — ABNORMAL HIGH (ref 65–99)
Potassium: 3.9 mmol/L (ref 3.5–5.1)
SODIUM: 142 mmol/L (ref 135–145)
TOTAL PROTEIN: 7.5 g/dL (ref 6.5–8.1)

## 2015-04-26 LAB — CBC
HCT: 43.7 % (ref 36.0–46.0)
HEMOGLOBIN: 14.5 g/dL (ref 12.0–15.0)
MCH: 30.1 pg (ref 26.0–34.0)
MCHC: 33.2 g/dL (ref 30.0–36.0)
MCV: 90.9 fL (ref 78.0–100.0)
Platelets: 497 10*3/uL — ABNORMAL HIGH (ref 150–400)
RBC: 4.81 MIL/uL (ref 3.87–5.11)
RDW: 13.2 % (ref 11.5–15.5)
WBC: 13.3 10*3/uL — ABNORMAL HIGH (ref 4.0–10.5)

## 2015-04-26 LAB — LIPASE, BLOOD: Lipase: 25 U/L (ref 11–51)

## 2015-04-26 MED ORDER — METFORMIN HCL ER 500 MG PO TB24: 1000.0000 mg | ORAL_TABLET | Freq: Every day | ORAL | Status: DC

## 2015-04-26 MED ORDER — ONDANSETRON HCL 4 MG PO TABS: 4.0000 mg | ORAL_TABLET | Freq: Four times a day (QID) | ORAL | Status: DC

## 2015-04-26 MED ORDER — ONDANSETRON 4 MG PO TBDP
8.0000 mg | ORAL_TABLET | Freq: Once | ORAL | Status: AC
  Administered 2015-04-26: 8 mg via ORAL
  Filled 2015-04-26: qty 2

## 2015-04-26 NOTE — ED Notes (Signed)
Pt reports diarrhea and vomiting starting this morning. Pt alert x4. NAD at this time.

## 2015-04-26 NOTE — ED Provider Notes (Signed)
CSN: [id number]     Arrival date & time 04/26/15  4098 History   First MD Initiated Contact with Patient 04/26/15 1037     Chief Complaint  Patient presents with  . Diarrhea     (Consider location/radiation/quality/duration/timing/severity/associated sxs/prior Treatment) HPI Comments: Patient presents to the emergency department with chief complaints of nausea, vomiting, diarrhea. She states that she began having diarrhea early this morning. She reports having multiple episodes. She also reports having several episodes of vomiting. She denies any bloody or bilious vomiting or diarrhea. She states that her daughter is sick with the same symptoms. She denies any fevers or chills. States that she originally had a very "crampy abdomen," but now she is feeling much better. There are no modifying factors.  The history is provided by the patient. No language interpreter was used.    Past Medical History  Diagnosis Date  . Anemia   . Colitis   . Crohn's disease (HCC)   . ABNORMAL PAP SMEAR, LGSIL 02/11/2007    Qualifier: Diagnosis of  By: Sandi Mealy  MD, Judeth Cornfield    . Diabetes mellitus, new onset (HCC) 02/28/2014  . Oropharyngeal candidiasis 02/28/2014  . Diabetes mellitus without complication [facility])    Past Surgical History  Procedure Laterality Date  . Cholecystectomy     Family History  Problem Relation Age of Onset  . Colon cancer Maternal Grandmother   . Breast cancer      aunt  . Depression    . Depression Brother     committed suicide in 54  . Diabetes Mother   . Hyperlipidemia Mother   . Hypertension Mother   . Diabetes Father   . Hyperlipidemia Father   . Hypertension Father    Social History  Substance Use Topics  . Smoking status: Current Every Day Smoker -- 0.50 packs/day    Types: Cigarettes  . Smokeless tobacco: Never Used  . Alcohol Use: No   OB History    No data available     Review of Systems  Constitutional: Negative for fever and chills.  Respiratory:  Negative for shortness of breath.   Cardiovascular: Negative for chest pain.  Gastrointestinal: Positive for nausea, vomiting and diarrhea. Negative for constipation.  Genitourinary: Negative for dysuria.  All other systems reviewed and are negative.     Allergies  Review of patient's allergies indicates no known allergies.  Home Medications   Prior to Admission medications   Medication Sig Start Date End Date Taking? Authorizing Provider  acetaminophen (TYLENOL) 500 MG tablet Take 1,000 mg by mouth every 6 (six) hours as needed. For pain   Yes Historical Provider, MD  cholecalciferol (VITAMIN D) 1000 UNITS tablet Take 5,000 Units by mouth daily. Reported on 02/09/2015   Yes Historical Provider, MD  ferrous sulfate 325 (65 FE) MG tablet Take 325 mg by mouth daily with breakfast. Reported on 02/09/2015   Yes Historical Provider, MD  metFORMIN (GLUCOPHAGE XR) 500 MG 24 hr tablet Take 2 tablets (1,000 mg total) by mouth daily with breakfast. 04/26/15  Yes Kathee Delton, MD  Multiple Vitamin (MULTIVITAMIN) tablet Take 1 tablet by mouth daily. Reported on 02/09/2015   Yes Historical Provider, MD  omega-3 acid ethyl esters (LOVAZA) 1 G capsule Take 1 g by mouth daily. Reported on 02/09/2015   Yes Historical Provider, MD   BP 143/89 mmHg  Pulse 74  Temp(Src) 98 F (36.7 C) (Oral)  Resp 18  SpO2 99% Physical Exam  Constitutional: She is oriented to  person, place, and time. She appears well-developed and well-nourished.  HENT:  Head: Normocephalic and atraumatic.  Eyes: Conjunctivae and EOM are normal. Pupils are equal, round, and reactive to light.  Neck: Normal range of motion. Neck supple.  Cardiovascular: Normal rate and regular rhythm.  Exam reveals no gallop and no friction rub.   No murmur heard. Pulmonary/Chest: Effort normal and breath sounds normal. No respiratory distress. She has no wheezes. She has no rales. She exhibits no tenderness.  Abdominal: Soft. Bowel sounds are normal.  She exhibits no distension and no mass. There is no tenderness. There is no rebound and no guarding.  No focal abdominal tenderness, no RLQ tenderness or pain at McBurney's point, no RUQ tenderness or Murphy's sign, no left-sided abdominal tenderness, no fluid wave, or signs of peritonitis   Musculoskeletal: Normal range of motion. She exhibits no edema or tenderness.  Neurological: She is alert and oriented to person, place, and time.  Skin: Skin is warm and dry.  Psychiatric: She has a normal mood and affect. Her behavior is normal. Judgment and thought content normal.  Nursing note and vitals reviewed.   ED Course  Procedures (including critical care time) Results for orders placed or performed during the hospital encounter of 04/26/15  Lipase, blood  Result Value Ref Range   Lipase 25 11 - 51 U/L  Comprehensive metabolic panel  Result Value Ref Range   Sodium 142 135 - 145 mmol/L   Potassium 3.9 3.5 - 5.1 mmol/L   Chloride 108 101 - 111 mmol/L   CO2 25 22 - 32 mmol/L   Glucose, Bld 131 (H) 65 - 99 mg/dL   BUN 11 6 - 20 mg/dL   Creatinine, Ser 1.61 0.44 - 1.00 mg/dL   Calcium 09.6 (H) 8.9 - 10.3 mg/dL   Total Protein 7.5 6.5 - 8.1 g/dL   Albumin 4.0 3.5 - 5.0 g/dL   AST 17 15 - 41 U/L   ALT 15 14 - 54 U/L   Alkaline Phosphatase 94 38 - 126 U/L   Total Bilirubin 0.9 0.3 - 1.2 mg/dL   GFR calc non Af Amer >60 >60 mL/min   GFR calc Af Amer >60 >60 mL/min   Anion gap 9 5 - 15  CBC  Result Value Ref Range   WBC 13.3 (H) 4.0 - 10.5 K/uL   RBC 4.81 3.87 - 5.11 MIL/uL   Hemoglobin 14.5 12.0 - 15.0 g/dL   HCT 04.5 40.9 - 81.1 %   MCV 90.9 78.0 - 100.0 fL   MCH 30.1 26.0 - 34.0 pg   MCHC 33.2 30.0 - 36.0 g/dL   RDW 91.4 78.2 - 95.6 %   Platelets 497 (H) 150 - 400 K/uL   No results found.  I have personally reviewed and evaluated these images and lab results as part of my medical decision-making.   EKG Interpretation None      MDM   Final diagnoses:  Nausea vomiting  and diarrhea    Patient with nausea, vomiting, diarrhea. Symptoms have resolved. Patient's daughter is sick with the same symptoms. Suspect viral illness. No tenderness on abdominal exam. Vital signs are stable. Patient is feeling much better. Will give Zofran and plan for discharge.  Labs ordered in triage are pending.  11:42 AM Labs are reassuring.  Patient feels stable for discharge.    Roxy Horseman, PA-C 04/26/15 1142  Rolland Porter, MD 05/04/15 [phone]

## 2015-04-26 NOTE — ED Notes (Signed)
Pt ambulated to room. Pt unable to void at this time.

## 2015-04-26 NOTE — Discharge Instructions (Signed)

## 2015-11-09 ENCOUNTER — Telehealth: Payer: Self-pay | Admitting: Gastroenterology

## 2015-11-09 NOTE — Telephone Encounter (Signed)
FYI, I added her onto tomorrow's schedule. There was an opening and patient is having abdominal pain with constipation. She tried 3 dulcolax tablets yesterday with very minimal results. She denies fever, or any bleeding. Requesting refill for Lialda and establish with Dr. Myrtie Neither.

## 2015-11-09 NOTE — Telephone Encounter (Signed)
Patient having a crohns flare, needs to talk to the nurse. Pt has not been seen by Dr Myrtie Neither before. Has appt for 12-2015

## 2015-11-10 ENCOUNTER — Encounter: Payer: Self-pay | Admitting: Gastroenterology

## 2015-11-10 ENCOUNTER — Ambulatory Visit (INDEPENDENT_AMBULATORY_CARE_PROVIDER_SITE_OTHER): Payer: BC Managed Care – PPO | Admitting: Gastroenterology

## 2015-11-10 VITALS — BP 120/80 | HR 72 | Ht 65.5 in | Wt 165.2 lb

## 2015-11-10 DIAGNOSIS — K56699 Other intestinal obstruction unspecified as to partial versus complete obstruction: Secondary | ICD-10-CM

## 2015-11-10 DIAGNOSIS — K5010 Crohn's disease of large intestine without complications: Secondary | ICD-10-CM | POA: Diagnosis not present

## 2015-11-10 DIAGNOSIS — K5730 Diverticulosis of large intestine without perforation or abscess without bleeding: Secondary | ICD-10-CM

## 2015-11-10 DIAGNOSIS — R1084 Generalized abdominal pain: Secondary | ICD-10-CM

## 2015-11-10 DIAGNOSIS — Z720 Tobacco use: Secondary | ICD-10-CM

## 2015-11-10 HISTORY — DX: Crohn's disease of large intestine without complications: K50.10

## 2015-11-10 MED ORDER — CIPROFLOXACIN HCL 500 MG PO TABS: 500.0000 mg | ORAL_TABLET | Freq: Two times a day (BID) | ORAL | 1 refills | Status: DC

## 2015-11-10 MED ORDER — METRONIDAZOLE 500 MG PO TABS: 500.0000 mg | ORAL_TABLET | Freq: Three times a day (TID) | ORAL | 1 refills | Status: DC

## 2015-11-10 NOTE — Progress Notes (Signed)
[facility] Consult Note:  History: Elizabeth Andrade 11/10/2015  Referring physician: Mickie Hillier, MD  Reason for consult/chief complaint: Crohn's Disease (having a flare, abd cramping, change in bowels)   Subjective  HPI:  Elizabeth Andrade sees me for the first time, having last seen Dr. Arlyce Dice in March 2016. She appears to been diagnosed with Crohn's colitis on a colonoscopy done while hospitalized in January 2011. 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 It sounds like she was on Lialda and azathioprine for some time, but when she follow-up with Dr. Arlyce Dice in April 2015, Elizabeth Andrade was only taking these medicines intermittently. A colonoscopy In May 2015 showed no active inflammatory bowel disease, but there was still a stricture in the distal sigmoid as well as left-sided diverticulosis. She was on no medicines when she followed up in 2016. Since then, she reports feeling generally well with no chronic abdominal pain or altered bowel habits, rectal bleeding, nausea, vomiting or weight loss. About a week ago she started noticing increasing abdominal "noise and gurgling". About 2 days ago she started having more generalized abdominal pain and bloating and was not moving her bowels well. She attributes that to having eaten and entire pork roast in 2 days. She went on a liquid diet yesterday, and says that her pain is improved. She denies fever chest pain dyspnea dysuria, rectal bleeding or hematuria. ROS:  Review of Systems  Constitutional: Negative for appetite change and unexpected weight change.  HENT: Negative for mouth sores and voice change.   Eyes: Negative for pain and redness.  Respiratory: Negative for cough and shortness of breath.   Cardiovascular: Negative for chest pain and palpitations.  Genitourinary: Negative for dysuria and hematuria.  Musculoskeletal: Negative for arthralgias and myalgias.  Skin: Negative for  pallor and rash.  Neurological: Negative for weakness and headaches.  Hematological: Negative for adenopathy.     Past Medical History: Past Medical History:  Diagnosis Date  . ABNORMAL PAP SMEAR, LGSIL 02/11/2007   Qualifier: Diagnosis of  By: Sandi Mealy  MD, Judeth Cornfield    . Anemia   . Colitis   . Crohn's disease (HCC)   . Diabetes mellitus without complication (HCC)   . Diabetes mellitus, new onset (HCC) 02/28/2014  . Oropharyngeal candidiasis 02/28/2014     Past Surgical History: Past Surgical History:  Procedure Laterality Date  . CHOLECYSTECTOMY       Family History: Family History  Problem Relation Age of Onset  . Colon cancer Maternal Grandmother   . Breast cancer      aunt  . Depression    . Depression Brother     committed suicide in 66  . Diabetes Mother   . Hyperlipidemia Mother   . Hypertension Mother   . Diabetes Father   . Hyperlipidemia Father   . Hypertension Father     Social History: Social History   Social History  . Marital status: Married    Spouse name: N/A  . Number of children: 5  . Years of education: N/A   Occupational History  . manager [employer]   Social History Main Topics  . Smoking status: Current Every Day Smoker    Packs/day: 0.50    Types: Cigarettes  . Smokeless tobacco: Never Used  . Alcohol use No  . Drug use: No     Comment: Marijuana in the past  . Sexual activity: Not Asked   Other Topics Concern  . None  Social History Narrative  . None    Allergies: No Known Allergies  Outpatient Meds: Current Outpatient Prescriptions  Medication Sig Dispense Refill  . acetaminophen (TYLENOL) 500 MG tablet Take 1,000 mg by mouth every 6 (six) hours as needed. For pain    . cholecalciferol (VITAMIN D) 1000 UNITS tablet Take 5,000 Units by mouth daily. Reported on 02/09/2015    . ferrous sulfate 325 (65 FE) MG tablet Take 325 mg by mouth daily with breakfast. Reported on 02/09/2015    . metFORMIN (GLUCOPHAGE XR)  500 MG 24 hr tablet Take 2 tablets (1,000 mg total) by mouth daily with breakfast. 60 tablet 5  . Multiple Vitamin (MULTIVITAMIN) tablet Take 1 tablet by mouth daily. Reported on 02/09/2015    . omega-3 acid ethyl esters (LOVAZA) 1 G capsule Take 1 g by mouth daily. Reported on 02/09/2015    . ciprofloxacin (CIPRO) 500 MG tablet Take 1 tablet (500 mg total) by mouth 2 (two) times daily. 14 tablet 1  . metroNIDAZOLE (FLAGYL) 500 MG tablet Take 1 tablet (500 mg total) by mouth 3 (three) times daily. 21 tablet 1   No current facility-administered medications for this visit.       ___________________________________________________________________ Objective   Exam:  BP 120/80 (BP Location: Left Arm, Patient Position: Sitting, Cuff Size: Normal)   Pulse 72   Ht 5' 5.5" (1.664 m)   Wt 165 lb 4 oz (75 kg)   BMI 27.08 kg/m    General: this is a(n) Well-appearing middle-aged woman   Eyes: sclera anicteric, no redness  ENT: oral mucosa moist without lesions, no cervical or supraclavicular lymphadenopathy, good dentition  CV: RRR without murmur, S1/S2, no JVD, no peripheral edema  Resp: clear to auscultation bilaterally, normal RR and effort noted  GI: soft, mild epigastric to periumbilical tenderness, with active bowel sounds. No guarding or palpable organomegaly noted.  Skin; warm and dry, no rash or jaundice noted  Neuro: awake, alert and oriented x 3. Normal gross motor function and fluent speech    Assessment: Encounter Diagnoses  Name Primary?  . Generalized abdominal pain Yes  . Crohn's colitis, without complications (HCC)   . Colonic stricture   . Diverticulosis of colon without hemorrhage   . Tobacco abuse     I suspect she has diverticulitis rather than a flare of Crohn's colitis right now.  Plan:  Ciprofloxacin 500 mg twice daily and metronidazole 500 mg 3 times daily for 7 days each. MiraLAX 1 packet twice daily for the next 3 days. Liquid diet today and  tomorrow Presented to the ED over the weekend if pain escalates despite the above treatment plan. She will call us next week with an update. If she is not improved, the plan should be for an abdominal/pelvic CT scan with oral and IV contrast to the end of that week. She was counseled regarding the need for smoking cessation She will need a colonoscopy to assess her Crohn's activity: The timing of which depends upon how she feels next week. Thank you for the courtesy of this consult.  Please call me with any questions or concerns.  Charlie PitterHenry L Danis III  CC: Mickie HillierIan McKeag, MD

## 2015-11-10 NOTE — Patient Instructions (Addendum)
Take antibiotics as directed.  Please call us next Wednesday with an update on your symptoms. [phone] Raynelle Fanning RN  Liquid diet today and tomorrow. Stay well hydrated.  Miralax powder - 1 packet twice daily for 3 days, starting today.  If you are age 55 or older, your body mass index should be between 23-30. Your Body mass index is 27.08 kg/m. If this is out of the aforementioned range listed, please consider follow up with your Primary Care Provider.  If you are age 35 or younger, your body mass index should be between 19-25. Your Body mass index is 27.08 kg/m. If this is out of the aformentioned range listed, please consider follow up with your Primary Care Provider.   Thank you for choosing [facility]  Dr Amada Jupiter III

## 2015-11-12 ENCOUNTER — Encounter (HOSPITAL_COMMUNITY): Payer: Self-pay | Admitting: Emergency Medicine

## 2015-11-12 ENCOUNTER — Emergency Department (HOSPITAL_COMMUNITY): Payer: BC Managed Care – PPO

## 2015-11-12 ENCOUNTER — Inpatient Hospital Stay (HOSPITAL_COMMUNITY)
Admission: EM | Admit: 2015-11-12 | Discharge: 2015-11-17 | DRG: 389 | Disposition: A | Discharge status: Home / Self Care | Payer: BC Managed Care – PPO | Attending: Internal Medicine | Admitting: Internal Medicine

## 2015-11-12 DIAGNOSIS — B9620 Unspecified Escherichia coli [E. coli] as the cause of diseases classified elsewhere: Secondary | ICD-10-CM | POA: Diagnosis present

## 2015-11-12 DIAGNOSIS — R112 Nausea with vomiting, unspecified: Secondary | ICD-10-CM

## 2015-11-12 DIAGNOSIS — K6389 Other specified diseases of intestine: Secondary | ICD-10-CM | POA: Diagnosis present

## 2015-11-12 DIAGNOSIS — R911 Solitary pulmonary nodule: Secondary | ICD-10-CM | POA: Diagnosis present

## 2015-11-12 DIAGNOSIS — K5669 Other partial intestinal obstruction: Principal | ICD-10-CM | POA: Diagnosis present

## 2015-11-12 DIAGNOSIS — E119 Type 2 diabetes mellitus without complications: Secondary | ICD-10-CM | POA: Diagnosis present

## 2015-11-12 DIAGNOSIS — K529 Noninfective gastroenteritis and colitis, unspecified: Secondary | ICD-10-CM

## 2015-11-12 DIAGNOSIS — R1110 Vomiting, unspecified: Secondary | ICD-10-CM | POA: Diagnosis present

## 2015-11-12 DIAGNOSIS — F17210 Nicotine dependence, cigarettes, uncomplicated: Secondary | ICD-10-CM | POA: Diagnosis present

## 2015-11-12 DIAGNOSIS — K567 Ileus, unspecified: Secondary | ICD-10-CM

## 2015-11-12 DIAGNOSIS — K56699 Other intestinal obstruction unspecified as to partial versus complete obstruction: Secondary | ICD-10-CM | POA: Diagnosis not present

## 2015-11-12 DIAGNOSIS — Z79899 Other long term (current) drug therapy: Secondary | ICD-10-CM

## 2015-11-12 DIAGNOSIS — Z8249 Family history of ischemic heart disease and other diseases of the circulatory system: Secondary | ICD-10-CM

## 2015-11-12 DIAGNOSIS — Z7984 Long term (current) use of oral hypoglycemic drugs: Secondary | ICD-10-CM

## 2015-11-12 DIAGNOSIS — K5289 Other specified noninfective gastroenteritis and colitis: Secondary | ICD-10-CM | POA: Diagnosis present

## 2015-11-12 DIAGNOSIS — K639 Disease of intestine, unspecified: Secondary | ICD-10-CM | POA: Diagnosis not present

## 2015-11-12 DIAGNOSIS — Z792 Long term (current) use of antibiotics: Secondary | ICD-10-CM

## 2015-11-12 DIAGNOSIS — R8271 Bacteriuria: Secondary | ICD-10-CM | POA: Diagnosis present

## 2015-11-12 DIAGNOSIS — R933 Abnormal findings on diagnostic imaging of other parts of digestive tract: Secondary | ICD-10-CM | POA: Diagnosis present

## 2015-11-12 DIAGNOSIS — K5792 Diverticulitis of intestine, part unspecified, without perforation or abscess without bleeding: Secondary | ICD-10-CM | POA: Diagnosis present

## 2015-11-12 DIAGNOSIS — D649 Anemia, unspecified: Secondary | ICD-10-CM | POA: Diagnosis present

## 2015-11-12 DIAGNOSIS — K5010 Crohn's disease of large intestine without complications: Secondary | ICD-10-CM | POA: Diagnosis present

## 2015-11-12 DIAGNOSIS — Z9049 Acquired absence of other specified parts of digestive tract: Secondary | ICD-10-CM | POA: Diagnosis not present

## 2015-11-12 LAB — COMPREHENSIVE METABOLIC PANEL
ALT: 27 U/L (ref 14–54)
ANION GAP: 12 (ref 5–15)
AST: 25 U/L (ref 15–41)
Albumin: 4.5 g/dL (ref 3.5–5.0)
Alkaline Phosphatase: 119 U/L (ref 38–126)
BUN: 18 mg/dL (ref 6–20)
CHLORIDE: 103 mmol/L (ref 101–111)
CO2: 24 mmol/L (ref 22–32)
CREATININE: 0.79 mg/dL (ref 0.44–1.00)
Calcium: 11.1 mg/dL — ABNORMAL HIGH (ref 8.9–10.3)
Glucose, Bld: 123 mg/dL — ABNORMAL HIGH (ref 65–99)
POTASSIUM: 3.7 mmol/L (ref 3.5–5.1)
Sodium: 139 mmol/L (ref 135–145)
Total Bilirubin: 1.2 mg/dL (ref 0.3–1.2)
Total Protein: 8.5 g/dL — ABNORMAL HIGH (ref 6.5–8.1)

## 2015-11-12 LAB — CBC
HCT: 46.1 % — ABNORMAL HIGH (ref 36.0–46.0)
Hemoglobin: 15.8 g/dL — ABNORMAL HIGH (ref 12.0–15.0)
MCH: 30.1 pg (ref 26.0–34.0)
MCHC: 34.3 g/dL (ref 30.0–36.0)
MCV: 87.8 fL (ref 78.0–100.0)
PLATELETS: 577 10*3/uL — AB (ref 150–400)
RBC: 5.25 MIL/uL — AB (ref 3.87–5.11)
RDW: 13.1 % (ref 11.5–15.5)
WBC: 14.1 10*3/uL — AB (ref 4.0–10.5)

## 2015-11-12 LAB — URINALYSIS, ROUTINE W REFLEX MICROSCOPIC
GLUCOSE, UA: NEGATIVE mg/dL
Ketones, ur: 15 mg/dL — AB
Nitrite: POSITIVE — AB
PROTEIN: 30 mg/dL — AB
Specific Gravity, Urine: 1.038 — ABNORMAL HIGH (ref 1.005–1.030)
pH: 5.5 (ref 5.0–8.0)

## 2015-11-12 LAB — URINE MICROSCOPIC-ADD ON: RBC / HPF: NONE SEEN RBC/hpf (ref 0–5)

## 2015-11-12 LAB — LIPASE, BLOOD: LIPASE: 17 U/L (ref 11–51)

## 2015-11-12 LAB — POC OCCULT BLOOD, ED: FECAL OCCULT BLD: NEGATIVE

## 2015-11-12 MED ORDER — IOPAMIDOL (ISOVUE-300) INJECTION 61%
100.0000 mL | Freq: Once | INTRAVENOUS | Status: AC | PRN
  Administered 2015-11-12: 100 mL via INTRAVENOUS

## 2015-11-12 MED ORDER — ONDANSETRON 4 MG PO TBDP
4.0000 mg | ORAL_TABLET | Freq: Once | ORAL | Status: AC | PRN
  Administered 2015-11-12: 4 mg via ORAL
  Filled 2015-11-12: qty 1

## 2015-11-12 MED ORDER — SODIUM CHLORIDE 0.9 % IV BOLUS (SEPSIS)
500.0000 mL | Freq: Once | INTRAVENOUS | Status: AC
  Administered 2015-11-12: 500 mL via INTRAVENOUS

## 2015-11-12 MED ORDER — FENTANYL CITRATE (PF) 100 MCG/2ML IJ SOLN
100.0000 ug | INTRAMUSCULAR | Status: AC | PRN
Start: 2015-11-12 — End: 2015-11-12
  Administered 2015-11-12 (×2): 100 ug via INTRAVENOUS
  Filled 2015-11-12 (×2): qty 2

## 2015-11-12 MED ORDER — HYDROMORPHONE HCL 1 MG/ML IJ SOLN
0.5000 mg | INTRAMUSCULAR | Status: DC | PRN
  Administered 2015-11-13 (×2): 1 mg via INTRAVENOUS
  Administered 2015-11-13: 0.5 mg via INTRAVENOUS
  Administered 2015-11-13 (×2): 1 mg via INTRAVENOUS
  Administered 2015-11-14 – 2015-11-15 (×7): 0.5 mg via INTRAVENOUS
  Filled 2015-11-12 (×13): qty 1

## 2015-11-12 MED ORDER — MORPHINE SULFATE (PF) 4 MG/ML IV SOLN: 4.0000 mg | Freq: Once | INTRAVENOUS | Status: DC

## 2015-11-12 MED ORDER — IOPAMIDOL (ISOVUE-300) INJECTION 61%
30.0000 mL | Freq: Once | INTRAVENOUS | Status: AC | PRN
  Administered 2015-11-12: 30 mL via ORAL

## 2015-11-12 MED ORDER — SODIUM CHLORIDE 0.9 % IV SOLN
INTRAVENOUS | Status: DC
  Administered 2015-11-12: via INTRAVENOUS

## 2015-11-12 MED ORDER — FAMOTIDINE IN NACL 20-0.9 MG/50ML-% IV SOLN
20.0000 mg | INTRAVENOUS | Status: DC
  Administered 2015-11-12 – 2015-11-16 (×4): 20 mg via INTRAVENOUS
  Filled 2015-11-12 (×4): qty 50

## 2015-11-12 MED ORDER — ONDANSETRON HCL 4 MG/2ML IJ SOLN
4.0000 mg | Freq: Once | INTRAMUSCULAR | Status: AC
  Administered 2015-11-12: 4 mg via INTRAVENOUS
  Filled 2015-11-12: qty 2

## 2015-11-12 NOTE — ED Notes (Signed)
Pt at CT

## 2015-11-12 NOTE — ED Provider Notes (Signed)
[facility]-EMERGENCY DEPT Provider Note   CSN: [id number] Arrival date & time: 11/12/15  1643     History   Chief Complaint Chief Complaint  Patient presents with  . Abdominal Pain  . Emesis    HPI Elizabeth Andrade is a 55 y.o. female. She presents for evaluation of abdominal pain, for several days, with an urge to defecate. The pain is primarily in the epigastric area. She feels bloated. She continues to be able to even drink. She saw a GI doctor, several days ago and was prescribed Cipro and Flagyl, for suspected diverticulitis. She has a prior diagnosis of Crohn's disease, by her report. She was also advised to take MiraLAX, by the gastroenterologist, and had one small bowel movement after that. This did not resolve her symptoms. She denies fever, chills, cough, shortness of breath, chest pain, weakness or dizziness. She does not have chronic constipation. There are no other known modifying factors.  HPI  Past Medical History:  Diagnosis Date  . ABNORMAL PAP SMEAR, LGSIL 02/11/2007   Qualifier: Diagnosis of  By: Sandi MealyAlm  MD, Judeth CornfieldStephanie    . Anemia   . Colitis   . Crohn's disease (HCC)   . Diabetes mellitus without complication (HCC)   . Diabetes mellitus, new onset (HCC) 02/28/2014  . Oropharyngeal candidiasis 02/28/2014    Patient Active Problem List   Diagnosis Date Noted  . Crohn's colitis, without complications (HCC) 11/10/2015  . Healthcare maintenance 03/29/2014  . Candida esophagitis (HCC) 03/01/2014  . Type 2 diabetes mellitus (HCC) 02/28/2014  . Depression with anxiety 02/28/2014  . History of alcohol abuse 02/28/2014  . Tobacco abuse 02/28/2014    Past Surgical History:  Procedure Laterality Date  . CHOLECYSTECTOMY      OB History    No data available       Home Medications    Prior to Admission medications   Medication Sig Start Date End Date Taking? Authorizing Provider  acetaminophen (TYLENOL) 500 MG tablet Take 1,000 mg by mouth every 6 (six) hours as  needed for moderate pain. For pain    Yes Historical Provider, MD  cholecalciferol (VITAMIN D) 1000 UNITS tablet Take 5,000 Units by mouth daily. Reported on 02/09/2015   Yes Historical Provider, MD  ciprofloxacin (CIPRO) 500 MG tablet Take 1 tablet (500 mg total) by mouth 2 (two) times daily. 11/10/15 11/17/15 Yes Charlie PitterHenry L Danis III, MD  ferrous sulfate 325 (65 FE) MG tablet Take 325 mg by mouth daily with breakfast. Reported on 02/09/2015   Yes Historical Provider, MD  metFORMIN (GLUCOPHAGE XR) 500 MG 24 hr tablet Take 2 tablets (1,000 mg total) by mouth daily with breakfast. 04/26/15  Yes Kathee DeltonIan D McKeag, MD  metroNIDAZOLE (FLAGYL) 500 MG tablet Take 1 tablet (500 mg total) by mouth 3 (three) times daily. 11/10/15 11/17/15 Yes Charlie PitterHenry L Danis III, MD  Multiple Vitamin (MULTIVITAMIN) tablet Take 1 tablet by mouth daily. Reported on 02/09/2015   Yes Historical Provider, MD  omega-3 acid ethyl esters (LOVAZA) 1 G capsule Take 1 g by mouth daily. Reported on 02/09/2015   Yes Historical Provider, MD    Family History Family History  Problem Relation Age of Onset  . Colon cancer Maternal Grandmother   . Breast cancer      aunt  . Depression    . Depression Brother     committed suicide in [id number]  . Diabetes Mother   . Hyperlipidemia Mother   . Hypertension Mother   . Diabetes Father   .  Hyperlipidemia Father   . Hypertension Father     Social History Social History  Substance Use Topics  . Smoking status: Current Every Day Smoker    Packs/day: 0.50    Types: Cigarettes  . Smokeless tobacco: Never Used  . Alcohol use No     Allergies   Review of patient's allergies indicates no known allergies.   Review of Systems Review of Systems  All other systems reviewed and are negative.    Physical Exam Updated Vital Signs BP 112/72 (BP Location: Left Arm)   Pulse 81   Temp 98.1 F (36.7 C) (Oral)   Resp 18   Wt 167 lb (75.8 kg)   SpO2 99%   BMI 27.37 kg/m   Physical Exam    Constitutional: She is oriented to person, place, and time. She appears well-developed and well-nourished.  HENT:  Head: Normocephalic and atraumatic.  Eyes: Conjunctivae and EOM are normal. Pupils are equal, round, and reactive to light.  Neck: Normal range of motion and phonation normal. Neck supple.  Cardiovascular: Normal rate and regular rhythm.   Pulmonary/Chest: Effort normal and breath sounds normal. She exhibits no tenderness.  Abdominal: Soft. She exhibits distension. She exhibits no mass. There is tenderness (Epigastric, mild). There is no guarding.  Hypoactive bowel sounds  Musculoskeletal: Normal range of motion.  Neurological: She is alert and oriented to person, place, and time. She exhibits normal muscle tone.  Skin: Skin is warm and dry.  Psychiatric: She has a normal mood and affect. Her behavior is normal. Judgment and thought content normal.  Nursing note and vitals reviewed.    ED Treatments / Results  Labs (all labs ordered are listed, but only abnormal results are displayed) Labs Reviewed  COMPREHENSIVE METABOLIC PANEL - Abnormal; Notable for the following:       Result Value   Glucose, Bld 123 (*)    Calcium 11.1 (*)    Total Protein 8.5 (*)    All other components within normal limits  CBC - Abnormal; Notable for the following:    WBC 14.1 (*)    RBC 5.25 (*)    Hemoglobin 15.8 (*)    HCT 46.1 (*)    Platelets 577 (*)    All other components within normal limits  URINALYSIS, ROUTINE W REFLEX MICROSCOPIC (NOT AT [facility]) - Abnormal; Notable for the following:    Color, Urine AMBER (*)    Specific Gravity, Urine 1.038 (*)    Hgb urine dipstick TRACE (*)    Bilirubin Urine MODERATE (*)    Ketones, ur 15 (*)    Protein, ur 30 (*)    Nitrite POSITIVE (*)    Leukocytes, UA SMALL (*)    All other components within normal limits  URINE MICROSCOPIC-ADD ON - Abnormal; Notable for the following:    Squamous Epithelial / LPF 0-5 (*)    Bacteria, UA RARE (*)     All other components within normal limits  URINE CULTURE  LIPASE, BLOOD  POC OCCULT BLOOD, ED    EKG  EKG Interpretation None       Radiology Ct Abdomen Pelvis W Contrast  Result Date: 11/12/2015 CLINICAL DATA:  55 year old female with abdominal. Patient is unable to tolerate food. EXAM: CT ABDOMEN AND PELVIS WITH CONTRAST TECHNIQUE: Multidetector CT imaging of the abdomen and pelvis was performed using the standard protocol following bolus administration of intravenous contrast. CONTRAST:  30mL ISOVUE-300 IOPAMIDOL (ISOVUE-300) INJECTION 61%, ISOVUE-300 IOPAMIDOL (ISOVUE-300) INJECTION 61% COMPARISON:  Abdominal radiograph dated 01/11 and dated 02/07/2009 FINDINGS: Lower chest: There is an 8 mm nodule at the right lung base (series 2, image 15) which similar the study dated 02/07/2009. The visualized lung bases is otherwise No intra-abdominal free air. Small pelvic ascites. Small amount of perihepatic free fluid is also noted. Hepatobiliary: Cholecystectomy. The liver appears unremarkable. No intrahepatic biliary ductal dilatation. Pancreas: Unremarkable. No pancreatic ductal dilatation or surrounding inflammatory changes. Spleen: Normal in size without focal abnormality. Adrenals/Urinary Tract: Adrenal glands are unremarkable. Kidneys are normal, without renal calculi, focal lesion, or hydronephrosis. Bladder is unremarkable. Stomach/Bowel: There is segmental thickening and irregularity of the sigmoid colon. There is a 3.2 x 4.0 cm soft tissue mass involving the sigmoid colon with associated narrowing of the lumen of the colon. Although thickened segment of the sigmoid colon may partly related to muscular hypertrophy related to recurrent diverticulitis, this masslike density is highly concerning for neoplasm. Further evaluation with colonoscopy recommended. No significant active inflammatory changes identified. Minimal perisigmoid haziness noted diffusely colonic fold represent mild  diverticulitis. The colon is filled with liquid content compatible with diarrheal state. There is mild diffuse thickening of the colonic wall which may represent a degree of colitis. There is a small hiatal hernia. Multiple nondistended loops of small bowel with air-fluid level likely representing a degree of ileus. There is apparent segment of thickened loop of distal ileum in the left hemi abdomen (series 2, image 60) which may represent layering high attenuating content versus less likely segmental thickening of the bowel wall. There is no definite evidence of bowel obstruction. Normal appendix. Vascular/Lymphatic: There is mild aortoiliac atherosclerotic disease. The origins of the celiac axis, SMA, IMA as well as the origins of the renal arteries are patent. No portal venous gas identified. There is no adenopathy. Reproductive: The uterus and ovaries are grossly unremarkable. Other: None Musculoskeletal: Small fat containing umbilical hernia. Abdominal wall soft tissues appear unremarkable. The osseous structures are intact. IMPRESSION: Focal masslike lesion within the sigmoid colon suspicious for neoplasm. Further evaluation with colonoscopy recommended. There is segmental thickening of the colon, likely partly related to chronic diverticulitis. Diarrheal state with findings of possible mild colitis. Correlation with clinical exam and stool cultures recommended. Small bowel ileus. Layering bowel contents versus less likely segmental thickening of the distal small bowel in the left hemipelvis. No evidence of bowel obstruction. Normal appendix. An 8 mm right lung base nodule similar to CT dated 02/07/2009 Electronically Signed   By: Elgie Collard M.D.   On: 11/12/2015 22:24    Procedures Procedures (including critical care time)  Medications Ordered in ED Medications  0.9 %  sodium chloride infusion ( Intravenous Not Given 11/12/15 2230)  ondansetron (ZOFRAN-ODT) disintegrating tablet 4 mg (4 mg Oral  Given 11/12/15 1728)  sodium chloride 0.9 % bolus 500 mL (0 mLs Intravenous Stopped 11/12/15 2130)  ondansetron (ZOFRAN) injection 4 mg (4 mg Intravenous Given 11/12/15 2026)  fentaNYL (SUBLIMAZE) injection 100 mcg (100 mcg Intravenous Given 11/12/15 2227)  iopamidol (ISOVUE-300) 61 % injection 100 mL (100 mLs Intravenous Contrast Given 11/12/15 2127)  iopamidol (ISOVUE-300) 61 % injection 30 mL (30 mLs Oral Contrast Given 11/12/15 2127)     Initial Impression / Assessment and Plan / ED Course  I have reviewed the triage vital signs and the nursing notes.  Pertinent labs & imaging results that were available during my care of the patient were reviewed by me and considered in my medical decision making (see chart for details).  Clinical Course    Medications  0.9 %  sodium chloride infusion ( Intravenous Not Given 11/12/15 2230)  ondansetron (ZOFRAN-ODT) disintegrating tablet 4 mg (4 mg Oral Given 11/12/15 1728)  sodium chloride 0.9 % bolus 500 mL (0 mLs Intravenous Stopped 11/12/15 2130)  ondansetron (ZOFRAN) injection 4 mg (4 mg Intravenous Given 11/12/15 2026)  fentaNYL (SUBLIMAZE) injection 100 mcg (100 mcg Intravenous Given 11/12/15 2227)  iopamidol (ISOVUE-300) 61 % injection 100 mL (100 mLs Intravenous Contrast Given 11/12/15 2127)  iopamidol (ISOVUE-300) 61 % injection 30 mL (30 mLs Oral Contrast Given 11/12/15 2127)    Patient Vitals for the past 24 hrs:  BP Temp Temp src Pulse Resp SpO2 Weight  11/12/15 2010 112/72 - - 81 18 99 % -  11/12/15 1704 - - - - - - 167 lb (75.8 kg)  11/12/15 1702 114/74 98.1 F (36.7 C) Oral 90 18 99 % -    10:39 PM Reevaluation with update and discussion. After initial assessment and treatment, an updated evaluation reveals She feels somewhat better after the second dose of fentanyl. She is thirsty now and not nauseated. She has not vomited after the oral contrast. Findings discussed with the patient and all questions were answered. She  understands that she will need to stay in the hospital for further evaluation and treatment. Avrielle Fry L   10:39 PM-Consult complete with Dr. Maryfrances Bunnell. Patient case explained and discussed. He agrees to evaluate patient for admission and further evaluation and treatment. Call ended at 22:56  Final Clinical Impressions(s) / ED Diagnoses   Final diagnoses:  Colonic mass  Colitis  Ileus (HCC)    Abdominal pain with bloating, and colon abnormality, nonspecific. Differential diagnosis includes diverticulitis, inflammatory bowel disease, and cancer. Patient is symptomatic, with nausea and decreased appetite, but not actively vomiting. Doubt acute bowel obstruction.  Nursing Notes Reviewed/ Care Coordinated, and agree without changes. Applicable Imaging Reviewed.  Interpretation of Laboratory Data incorporated into ED treatment  Plan- evaluation by hospitalist for admission  New Prescriptions New Prescriptions   No medications on file     Mancel Bale, MD 11/12/15 2259

## 2015-11-12 NOTE — H&P (Signed)
History and Physical  Patient Name: Elizabeth Andrade     ZOX:[id number]    DOB: March 19, 1960    DOA: 11/12/2015 PCP: Mickie HillierIan McKeag, MD  Gastroenterologist: Dr. Milbert Coulteranis, [place]     Patient coming from: Home  Chief Complaint: Abdominal discomfort, constipation  HPI: Elizabeth Andrade is a 55 y.o. female with a past medical history significant for Crohn's colitis not currently on disease-specific therapy who presents with 1 week abominal discomfort.  The patient was in her usual state of health until about the last month when she noticed a change in character of her BMs, to the point that she only had a few "thin" BM once or twice a day.  She subsequently started to develop vague abdominal discomfort/bloating, that really started to get uncomfortable about a week ago.  She saw her gastroenterologist two days ago for abdominal pain and bloating and "gurgling" and "not moving bowels well" and only being able to take liquid diet.  Her abdominal pain is sharp, migratory, diffuse, moderate to severe.  She thought maybe she was having a Crohn's flare, but this was favored to be colitis rather than Crohn's and she was prescribed cipro/Flagyl orally and MiraLAX TID.  She has been able to take MiraLAX BID and the antibiotics, but yesterday she started vomiting and couldn't keep anything down.  She did have more liquid stool yesterday after MiraLAX, but her gurgling, bloating and abdominal pain worsened today and so she came to the ER.  ED course: -Afebrile, heart rate 90, respirations and pulse ox and blood pressure normal -Na 139, K 3.7, Cr 0.8, WBC 14.1K, Hgb 15.8 -lipase normal -UA without pyuria or hematura or bacteria on microscopic -FOBT negative -CT of the abdomen and pelvis with contrast was obtained that showed ileus/dilated bowel, and a 3.2x4.0cm soft tissue mass in the sigmoid with narrowing of the lumen as well as areas of inflammation suggestive of colitis or diverticulitis -TRH was asked to  evaluate for ileus      Per chart review, she was diagnosed with Crohn's by colonoscopy in January 2011. Those reports mention stricture in the distal sigmoid. She was treated with Lialda and azathioprine for some time, but when she followed up with Dr. Arlyce DiceKaplan in April 2015, she was only taking these medicines intermittently. Colonoscopy in May 2015 showed no active inflammatory bowel disease, but there was still a stricture in the distal sigmoid as well as left-sided diverticulosis.  Only second degree family member with colon cancer.          ROS: Review of Systems  Constitutional: Negative for chills, fever, malaise/fatigue and weight loss.  Gastrointestinal: Positive for abdominal pain, constipation, heartburn, nausea and vomiting. Negative for blood in stool and melena.  Genitourinary: Negative for dysuria, flank pain, frequency, hematuria and urgency.  All other systems reviewed and are negative.         Past Medical History:  Diagnosis Date  . ABNORMAL PAP SMEAR, LGSIL 02/11/2007   Qualifier: Diagnosis of  By: Sandi MealyAlm  MD, Judeth CornfieldStephanie    . Anemia   . Colitis   . Crohn's disease (HCC)   . Diabetes mellitus without complication (HCC)   . Diabetes mellitus, new onset (HCC) 02/28/2014  . Oropharyngeal candidiasis 02/28/2014    Past Surgical History:  Procedure Laterality Date  . CHOLECYSTECTOMY      Social History: Patient lives with her husband and son.  The patient walks unassisted.  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  She smokes,  wants to quit.    No Known Allergies  Family history: family history includes Arthritis in her maternal aunt; Colon cancer in her maternal grandmother; Depression in her brother; Diabetes in her father and mother; Hyperlipidemia in her father and mother; Hypertension in her father and mother.  Prior to Admission medications   Medication Sig Start Date End Date Taking? Authorizing Provider  acetaminophen (TYLENOL) 500 MG tablet  Take 1,000 mg by mouth every 6 (six) hours as needed for moderate pain. For pain    Yes Historical Provider, MD  cholecalciferol (VITAMIN D) 1000 UNITS tablet Take 5,000 Units by mouth daily. Reported on 02/09/2015   Yes Historical Provider, MD  ciprofloxacin (CIPRO) 500 MG tablet Take 1 tablet (500 mg total) by mouth 2 (two) times daily. 11/10/15 11/17/15 Yes Charlie Pitter III, MD  ferrous sulfate 325 (65 FE) MG tablet Take 325 mg by mouth daily with breakfast. Reported on 02/09/2015   Yes Historical Provider, MD  metFORMIN (GLUCOPHAGE XR) 500 MG 24 hr tablet Take 2 tablets (1,000 mg total) by mouth daily with breakfast. 04/26/15  Yes Kathee Delton, MD  metroNIDAZOLE (FLAGYL) 500 MG tablet Take 1 tablet (500 mg total) by mouth 3 (three) times daily. 11/10/15 11/17/15 Yes Charlie Pitter III, MD  Multiple Vitamin (MULTIVITAMIN) tablet Take 1 tablet by mouth daily. Reported on 02/09/2015   Yes Historical Provider, MD  omega-3 acid ethyl esters (LOVAZA) 1 G capsule Take 1 g by mouth daily. Reported on 02/09/2015   Yes Historical Provider, MD       Physical Exam: BP 112/72 (BP Location: Left Arm)   Pulse 81   Temp 98.1 F (36.7 C) (Oral)   Resp 18   Wt 75.8 kg (167 lb)   SpO2 99%   BMI 27.37 kg/m  General appearance: Well-developed, adult female, alert and in mild distress from abdominal bloating.   Eyes: Anicteric, conjunctiva pink, lids and lashes normal. PERRL.    ENT: No nasal deformity, discharge, epistaxis.  Hearing normal. OP moist without lesions.   Neck: No neck masses.  Trachea midline.  No thyromegaly/tenderness. Lymph: No cervical or supraclavicular lymphadenopathy. Skin: Warm and dry.  No jaundice.  A small patch of thickened darkened skin on the left palm.  No shin lesions. Cardiac: RRR, nl S1-S2, no murmurs appreciated.  Capillary refill is brisk.  No JVD.  No LE edema.  Radial and DP pulses 2+ and symmetric. Respiratory: Normal respiratory rate and rhythm.  CTAB without rales or  wheezes. Abdomen: Abdomen soft.  Mild diffuse TTP, nonfocal, no guarding.  Distended abdomen, resonant to percussion.  I don't appreciate a mass.  No ascites, hepatosplenomegaly.   MSK: No deformities or effusions.  No cyanosis or clubbing. Neuro: Cranial nerves normal.  Sensation intact to light touch. Speech is fluent.  Muscle strength normal.    Psych: Sensorium intact and responding to questions, attention normal.  Behavior appropriate.  Affect normal.  Judgment and insight appear normal.     Labs on Admission:  I have personally reviewed following labs and imaging studies: CBC:  Recent Labs Lab 11/12/15 1715  WBC 14.1*  HGB 15.8*  HCT 46.1*  MCV 87.8  PLT 577*   Basic Metabolic Panel:  Recent Labs Lab 11/12/15 1715  NA 139  K 3.7  CL 103  CO2 24  GLUCOSE 123*  BUN 18  CREATININE 0.79  CALCIUM 11.1*   GFR: Estimated Creatinine Clearance: 81.8 mL/min (by C-G formula based on SCr of  0.79 mg/dL).  Liver Function Tests:  Recent Labs Lab 11/12/15 1715  AST 25  ALT 27  ALKPHOS 119  BILITOT 1.2  PROT 8.5*  ALBUMIN 4.5    Recent Labs Lab 11/12/15 1715  LIPASE 17   No results for input(s): AMMONIA in the last 168 hours. Coagulation Profile: No results for input(s): INR, PROTIME in the last 168 hours. Cardiac Enzymes: No results for input(s): CKTOTAL, CKMB, CKMBINDEX, TROPONINI in the last 168 hours. BNP (last 3 results) No results for input(s): PROBNP in the last 8760 hours. HbA1C: No results for input(s): HGBA1C in the last 72 hours. CBG: No results for input(s): GLUCAP in the last 168 hours. Lipid Profile: No results for input(s): CHOL, HDL, LDLCALC, TRIG, CHOLHDL, LDLDIRECT in the last 72 hours. Thyroid Function Tests: No results for input(s): TSH, T4TOTAL, FREET4, T3FREE, THYROIDAB in the last 72 hours. Anemia Panel: No results for input(s): VITAMINB12, FOLATE, FERRITIN, TIBC, IRON, RETICCTPCT in the last 72 hours. Sepsis Labs: Invalid  input(s): PROCALCITONIN, LACTICIDVEN No results found for this or any previous visit (from the past 240 hour(s)).       Radiological Exams on Admission: Personally reviewed following report: Ct Abdomen Pelvis W Contrast  Result Date: 11/12/2015 CLINICAL DATA:  55 year old female with abdominal. Patient is unable to tolerate food. EXAM: CT ABDOMEN AND PELVIS WITH CONTRAST TECHNIQUE: Multidetector CT imaging of the abdomen and pelvis was performed using the standard protocol following bolus administration of intravenous contrast. CONTRAST:  30mL ISOVUE-300 IOPAMIDOL (ISOVUE-300) INJECTION 61%, ISOVUE-300 IOPAMIDOL (ISOVUE-300) INJECTION 61% COMPARISON:  Abdominal radiograph dated 01/11 and dated 02/07/2009 FINDINGS: Lower chest: There is an 8 mm nodule at the right lung base (series 2, image 15) which similar the study dated 02/07/2009. The visualized lung bases is otherwise No intra-abdominal free air. Small pelvic ascites. Small amount of perihepatic free fluid is also noted. Hepatobiliary: Cholecystectomy. The liver appears unremarkable. No intrahepatic biliary ductal dilatation. Pancreas: Unremarkable. No pancreatic ductal dilatation or surrounding inflammatory changes. Spleen: Normal in size without focal abnormality. Adrenals/Urinary Tract: Adrenal glands are unremarkable. Kidneys are normal, without renal calculi, focal lesion, or hydronephrosis. Bladder is unremarkable. Stomach/Bowel: There is segmental thickening and irregularity of the sigmoid colon. There is a 3.2 x 4.0 cm soft tissue mass involving the sigmoid colon with associated narrowing of the lumen of the colon. Although thickened segment of the sigmoid colon may partly related to muscular hypertrophy related to recurrent diverticulitis, this masslike density is highly concerning for neoplasm. Further evaluation with colonoscopy recommended. No significant active inflammatory changes identified. Minimal perisigmoid haziness noted  diffusely colonic fold represent mild diverticulitis. The colon is filled with liquid content compatible with diarrheal state. There is mild diffuse thickening of the colonic wall which may represent a degree of colitis. There is a small hiatal hernia. Multiple nondistended loops of small bowel with air-fluid level likely representing a degree of ileus. There is apparent segment of thickened loop of distal ileum in the left hemi abdomen (series 2, image 60) which may represent layering high attenuating content versus less likely segmental thickening of the bowel wall. There is no definite evidence of bowel obstruction. Normal appendix. Vascular/Lymphatic: There is mild aortoiliac atherosclerotic disease. The origins of the celiac axis, SMA, IMA as well as the origins of the renal arteries are patent. No portal venous gas identified. There is no adenopathy. Reproductive: The uterus and ovaries are grossly unremarkable. Other: None Musculoskeletal: Small fat containing umbilical hernia. Abdominal wall soft tissues appear unremarkable.  The osseous structures are intact. IMPRESSION: Focal masslike lesion within the sigmoid colon suspicious for neoplasm. Further evaluation with colonoscopy recommended. There is segmental thickening of the colon, likely partly related to chronic diverticulitis. Diarrheal state with findings of possible mild colitis. Correlation with clinical exam and stool cultures recommended. Small bowel ileus. Layering bowel contents versus less likely segmental thickening of the distal small bowel in the left hemipelvis. No evidence of bowel obstruction. Normal appendix. An 8 mm right lung base nodule similar to CT dated 02/07/2009 Electronically Signed   By: Elgie Collard M.D.   On: 11/12/2015 22:24        Assessment/Plan  1. Abdominal distension from colitis vs ileus vs stricture:  Stricture was noted since 2015, and there was an area of heavier inflammation in the sigmoid noted on her  endoscopy reports from 2011 as well.  No mention of polyps ever.  Only biopsies from 2011 showed colitis without malignancy. -MIVF -May have clears as tolerated -Ondansetron for nausea, hydromorphone for pain -Consult to GI regarding: need for biopsy?      2. Colitis:  -Check GI pathogen panel -Continue Ciprofloxacin and Flagyl started on Thursday for now  3. Crohn's disease:  Not currently treated.  4. NIDDM:  -Hold metformin -low dose SSI q4hrs        DVT prophylaxis: Lovenox  Code Status: FULL  Family Communication: Husband at bedside  Disposition Plan: Anticipate IV fluids and symptom control.  Consult to Gastroenterology tomorrow. Consults called: None overnight Admission status: INPATIENT, med surg         Medical decision making: Patient seen at 11:05 PM on 11/12/2015.  The patient was discussed with Dr. Effie Shy.  What exists of the patient's chart was reviewed in depth and summarized above.  Clinical condition: stable.        Alberteen Sam [facility] Pager [phone]

## 2015-11-12 NOTE — ED Triage Notes (Signed)
States she feels like she is having reflux and that she cant keep anything down. States she went to the doctor and they diagnosed her with diverticulitis. States she has thrown up twice today. States she has not taken anything for nausea or pain. States has not had any fevers.

## 2015-11-12 NOTE — ED Notes (Signed)
Bed: WA07 Expected date:  Expected time:  Means of arrival:  Comments: Deep Cleaning

## 2015-11-12 NOTE — ED Notes (Signed)
Pt c/o reflux & requesting med for same.

## 2015-11-12 NOTE — ED Triage Notes (Signed)
States she has been hearing her stomach sound like it wanted to have a Bm, states that Thursday that she couldn't take it anymore and went to her doc and was prescribed 2 abx.-cipro and metronidazole. Reports dx of Chrons.

## 2015-11-13 DIAGNOSIS — K6389 Other specified diseases of intestine: Secondary | ICD-10-CM

## 2015-11-13 DIAGNOSIS — R933 Abnormal findings on diagnostic imaging of other parts of digestive tract: Secondary | ICD-10-CM

## 2015-11-13 DIAGNOSIS — K567 Ileus, unspecified: Secondary | ICD-10-CM

## 2015-11-13 DIAGNOSIS — K639 Disease of intestine, unspecified: Secondary | ICD-10-CM

## 2015-11-13 DIAGNOSIS — K529 Noninfective gastroenteritis and colitis, unspecified: Secondary | ICD-10-CM | POA: Diagnosis present

## 2015-11-13 DIAGNOSIS — K5010 Crohn's disease of large intestine without complications: Secondary | ICD-10-CM

## 2015-11-13 LAB — BASIC METABOLIC PANEL
ANION GAP: 9 (ref 5–15)
BUN: 16 mg/dL (ref 6–20)
CALCIUM: 10.6 mg/dL — AB (ref 8.9–10.3)
CHLORIDE: 102 mmol/L (ref 101–111)
CO2: 26 mmol/L (ref 22–32)
Creatinine, Ser: 0.74 mg/dL (ref 0.44–1.00)
GFR calc non Af Amer: >60 mL/min (ref >60)
Glucose, Bld: 145 mg/dL — ABNORMAL HIGH (ref 65–99)
POTASSIUM: 4.2 mmol/L (ref 3.5–5.1)
Sodium: 137 mmol/L (ref 135–145)

## 2015-11-13 LAB — GLUCOSE, CAPILLARY
GLUCOSE-CAPILLARY: 107 mg/dL — AB (ref 65–99)
GLUCOSE-CAPILLARY: 128 mg/dL — AB (ref 65–99)
Glucose-Capillary: 118 mg/dL — ABNORMAL HIGH (ref 65–99)
Glucose-Capillary: 138 mg/dL — ABNORMAL HIGH (ref 65–99)
Glucose-Capillary: 148 mg/dL — ABNORMAL HIGH (ref 65–99)
Glucose-Capillary: 86 mg/dL (ref 65–99)

## 2015-11-13 LAB — C-REACTIVE PROTEIN: CRP: 1.6 mg/dL — ABNORMAL HIGH (ref <1.0)

## 2015-11-13 MED ORDER — ONDANSETRON HCL 4 MG PO TABS: 4.0000 mg | ORAL_TABLET | Freq: Four times a day (QID) | ORAL | Status: DC | PRN

## 2015-11-13 MED ORDER — METRONIDAZOLE IN NACL 5-0.79 MG/ML-% IV SOLN
500.0000 mg | Freq: Three times a day (TID) | INTRAVENOUS | Status: DC
  Administered 2015-11-13 – 2015-11-16 (×10): 500 mg via INTRAVENOUS
  Filled 2015-11-13 (×9): qty 100

## 2015-11-13 MED ORDER — ENOXAPARIN SODIUM 40 MG/0.4ML ~~LOC~~ SOLN
40.0000 mg | SUBCUTANEOUS | Status: DC
  Administered 2015-11-14 – 2015-11-17 (×4): 40 mg via SUBCUTANEOUS
  Filled 2015-11-13 (×4): qty 0.4

## 2015-11-13 MED ORDER — INSULIN ASPART 100 UNIT/ML ~~LOC~~ SOLN
0.0000 [IU] | SUBCUTANEOUS | Status: DC
  Administered 2015-11-13 – 2015-11-16 (×4): 1 [IU] via SUBCUTANEOUS
  Filled 2015-11-13: qty 1

## 2015-11-13 MED ORDER — ONDANSETRON HCL 4 MG/2ML IJ SOLN: 4.0000 mg | Freq: Four times a day (QID) | INTRAMUSCULAR | Status: DC | PRN

## 2015-11-13 MED ORDER — SODIUM CHLORIDE 0.9 % IV SOLN
INTRAVENOUS | Status: DC
  Administered 2015-11-13 – 2015-11-17 (×8): via INTRAVENOUS

## 2015-11-13 MED ORDER — CIPROFLOXACIN IN D5W 400 MG/200ML IV SOLN
400.0000 mg | Freq: Two times a day (BID) | INTRAVENOUS | Status: DC
  Administered 2015-11-13 – 2015-11-15 (×7): 400 mg via INTRAVENOUS
  Filled 2015-11-13 (×7): qty 200

## 2015-11-13 MED ORDER — PHENOL 1.4 % MT LIQD
1.0000 | OROMUCOSAL | Status: DC | PRN
  Filled 2015-11-13: qty 177

## 2015-11-13 NOTE — Progress Notes (Addendum)
PROGRESS NOTE    Elizabeth DurandJudith M Andrade  ZOX:[id number] DOB: 1960/11/21 DOA: 11/12/2015 PCP: Mickie HillierIan McKeag, MD   Brief Narrative: 55 y.o. female with a past medical history significant for Crohn's colitis not currently on disease-specific therapy who presents with 1 week abominal discomfort, nausea and vomiting. Recently seen by her GI and started on cipro,flagyl with outpatient.   Assessment & Plan:   # Intractable nausea, vomiting and abdominal distention: Patient has history of bowel stricture related with Crohn's disease. CT scan of abdomen consistent with possible colon mass, ileus and colitis. -Continue IV fluid and IV Pepcid. -Spoke with GI consult. Possible NG tube placement.  # Colitis in a patient with history of Crohn's disease: CT scan correlate with chronic diverticulitis. Continue IV ciprofloxacin and IV Flagyl. -Nothing by mouth -GI consult for possible evaluation  # Type 2 diabetes mellitus without complication, without long-term current use of insulin (HCC): Monitor blood sugar level. Continue sliding scale.    # An 8 mm right lung base nodule similar to CT dated 02/07/2009: Advised outpatient follow-up.  Continue current medical and supportive care.  DVT prophylaxis: Lovenox subcutaneous Code Status: Full code Family Communication: Husband at bedside Disposition Plan: Likely discharge home in 1-2 days.   Consultants:   Gastroenterologist  Procedures: None Antimicrobials: Cipro and Flagyl started on October 13th by patient's gastroenterologist Dr. Myrtie Neitheranis.  Subjective: Patient was seen and examined at bedside. Patient reported nausea, vomiting and abdominal discomfort. Denied fever, chills, chest pain, shortness of breath, diarrhea, constipation. Reported worsening her symptoms. Husband at bedside.   Objective: Vitals:   11/13/15 0541 11/13/15 1027 11/13/15 1029 11/13/15 1207  BP: 120/63 (!) 177/66 (!) 144/62 129/77  Pulse: 62 (!) 59 (!) 52 60  Resp: 18 14  18     Temp: 97.6 F (36.4 C) 97.6 F (36.4 C)  97.7 F (36.5 C)  TempSrc: Oral Oral  Oral  SpO2: 97% 97%  97%  Weight:      Height:        Intake/Output Summary (Last 24 hours) at 11/13/15 1228 Last data filed at 11/13/15 0700  Gross per 24 hour  Intake             1000 ml  Output                0 ml  Net             1000 ml   Filed Weights   11/12/15 1704  Weight: 75.8 kg (167 lb)    Examination:  General exam:Retching for vomiting, nauseated. Not in apparent distress  Respiratory system: Clear to auscultation. Respiratory effort normal. Cardiovascular system: S1 & S2 heard, RRR. No pedal edema. Gastrointestinal system: Abdomen is soft and non-tender but mildly distended. Normal bowel sounds heard. Central nervous system: Alert and oriented. No focal neurological deficits. Extremities: Symmetric 5 x 5 power. Skin: No rashes, lesions or ulcers Psychiatry: Judgement and insight appear normal. Mood & affect appropriate.     Data Reviewed: I have personally reviewed following labs and imaging studies  CBC:  Recent Labs Lab 11/12/15 1715  WBC 14.1*  HGB 15.8*  HCT 46.1*  MCV 87.8  PLT 577*   Basic Metabolic Panel:  Recent Labs Lab 11/12/15 1715 11/13/15 0415  NA 139 137  K 3.7 4.2  CL 103 102  CO2 24 26  GLUCOSE 123* 145*  BUN 18 16  CREATININE 0.79 0.74  CALCIUM 11.1* 10.6*   GFR: Estimated Creatinine Clearance: 81.8 mL/min (  by C-G formula based on SCr of 0.74 mg/dL). Liver Function Tests:  Recent Labs Lab 11/12/15 1715  AST 25  ALT 27  ALKPHOS 119  BILITOT 1.2  PROT 8.5*  ALBUMIN 4.5    Recent Labs Lab 11/12/15 1715  LIPASE 17   No results for input(s): AMMONIA in the last 168 hours. Coagulation Profile: No results for input(s): INR, PROTIME in the last 168 hours. Cardiac Enzymes: No results for input(s): CKTOTAL, CKMB, CKMBINDEX, TROPONINI in the last 168 hours. BNP (last 3 results) No results for input(s): PROBNP in the last 8760  hours. HbA1C: No results for input(s): HGBA1C in the last 72 hours. CBG:  Recent Labs Lab 11/13/15 0412 11/13/15 0737 11/13/15 1210  GLUCAP 148* 107* 128*   Lipid Profile: No results for input(s): CHOL, HDL, LDLCALC, TRIG, CHOLHDL, LDLDIRECT in the last 72 hours. Thyroid Function Tests: No results for input(s): TSH, T4TOTAL, FREET4, T3FREE, THYROIDAB in the last 72 hours. Anemia Panel: No results for input(s): VITAMINB12, FOLATE, FERRITIN, TIBC, IRON, RETICCTPCT in the last 72 hours. Sepsis Labs: No results for input(s): PROCALCITON, LATICACIDVEN in the last 168 hours.  No results found for this or any previous visit (from the past 240 hour(s)).       Radiology Studies: Ct Abdomen Pelvis W Contrast  Result Date: 11/12/2015 CLINICAL DATA:  55 year old female with abdominal. Patient is unable to tolerate food. EXAM: CT ABDOMEN AND PELVIS WITH CONTRAST TECHNIQUE: Multidetector CT imaging of the abdomen and pelvis was performed using the standard protocol following bolus administration of intravenous contrast. CONTRAST:  71mL ISOVUE-300 IOPAMIDOL (ISOVUE-300) INJECTION 61%, ISOVUE-300 IOPAMIDOL (ISOVUE-300) INJECTION 61% COMPARISON:  Abdominal radiograph dated 01/11 and dated 02/07/2009 FINDINGS: Lower chest: There is an 8 mm nodule at the right lung base (series 2, image 15) which similar the study dated 02/07/2009. The visualized lung bases is otherwise No intra-abdominal free air. Small pelvic ascites. Small amount of perihepatic free fluid is also noted. Hepatobiliary: Cholecystectomy. The liver appears unremarkable. No intrahepatic biliary ductal dilatation. Pancreas: Unremarkable. No pancreatic ductal dilatation or surrounding inflammatory changes. Spleen: Normal in size without focal abnormality. Adrenals/Urinary Tract: Adrenal glands are unremarkable. Kidneys are normal, without renal calculi, focal lesion, or hydronephrosis. Bladder is unremarkable. Stomach/Bowel: There is  segmental thickening and irregularity of the sigmoid colon. There is a 3.2 x 4.0 cm soft tissue mass involving the sigmoid colon with associated narrowing of the lumen of the colon. Although thickened segment of the sigmoid colon may partly related to muscular hypertrophy related to recurrent diverticulitis, this masslike density is highly concerning for neoplasm. Further evaluation with colonoscopy recommended. No significant active inflammatory changes identified. Minimal perisigmoid haziness noted diffusely colonic fold represent mild diverticulitis. The colon is filled with liquid content compatible with diarrheal state. There is mild diffuse thickening of the colonic wall which may represent a degree of colitis. There is a small hiatal hernia. Multiple nondistended loops of small bowel with air-fluid level likely representing a degree of ileus. There is apparent segment of thickened loop of distal ileum in the left hemi abdomen (series 2, image 60) which may represent layering high attenuating content versus less likely segmental thickening of the bowel wall. There is no definite evidence of bowel obstruction. Normal appendix. Vascular/Lymphatic: There is mild aortoiliac atherosclerotic disease. The origins of the celiac axis, SMA, IMA as well as the origins of the renal arteries are patent. No portal venous gas identified. There is no adenopathy. Reproductive: The uterus and ovaries are grossly  unremarkable. Other: None Musculoskeletal: Small fat containing umbilical hernia. Abdominal wall soft tissues appear unremarkable. The osseous structures are intact. IMPRESSION: Focal masslike lesion within the sigmoid colon suspicious for neoplasm. Further evaluation with colonoscopy recommended. There is segmental thickening of the colon, likely partly related to chronic diverticulitis. Diarrheal state with findings of possible mild colitis. Correlation with clinical exam and stool cultures recommended. Small bowel  ileus. Layering bowel contents versus less likely segmental thickening of the distal small bowel in the left hemipelvis. No evidence of bowel obstruction. Normal appendix. An 8 mm right lung base nodule similar to CT dated 02/07/2009 Electronically Signed   By: Elgie Collard M.D.   On: 11/12/2015 22:24        Scheduled Meds: . ciprofloxacin  400 mg Intravenous BID  . enoxaparin (LOVENOX) injection  40 mg Subcutaneous Q24H  . famotidine (PEPCID) IV  20 mg Intravenous Q24H  . insulin aspart  0-9 Units Subcutaneous Q4H  . metronidazole  500 mg Intravenous Q8H   Continuous Infusions: . sodium chloride 125 mL/hr at 11/13/15 0539     LOS: 1 day    Time spent: 30 minutes.    Dron Jaynie Collins, MD [facility] Pager [phone]  If 7PM-7AM, please contact night-coverage [email or web address] Password [email or web address] 11/13/2015, 12:28 PM

## 2015-11-13 NOTE — Progress Notes (Signed)
NG tube inserted, verified by auscultation, with 330 cc's non-bilous output.  Patient not very tolerant of insertion, requesting for it to be taken out.  Patient encouraged to give it some more time, and reassured that discomfort can be reduced with throat spray, and ice chips.  Patient agreeable to leave in for now.

## 2015-11-13 NOTE — Consult Note (Signed)
Consultation  Referring Provider:Dr. Ronalee Belts      Primary Care Physician:  Mickie Hillier, MD Primary Gastroenterologist:  Dr. Myrtie Neither    Reason for Consultation: Colititis, History of Crohns, Stricture?             HPI:   Elizabeth Andrade is a 55 y.o. Caucasian female with past medical history significant for colitis, Crohn's disease, diabetes and anemia, who presented to the ED 11/12/15 with a complaint of abdominal discomfort and constipation.    Today, the patient describes that for the past week she has had increasing abdominal discomfort. Approximately a month ago she noticed a change in the character of her bowel movements noticing only "thin" bowel movements once or twice a day. Around that time she started to develop vague abdominal discomfort/bloating. She describes her abdominal pain as sharp, diffuse and moderate to severe. She has been taking the MiraLAX twice a day and the antibiotics but on Saturday night she started vomiting and couldn't keep anything down. She did have some liquid stool on Saturday after MiraLAX and a small amt of liquid stool yesterday but her abdominal pain and bloating have worsened. The patient tells me she has not eaten anything "much" since being here, in fact she tried a sip of juice this morning, but immediately felt nauseaous, so she stopped. The only thing she can do to be comfortable at the moment is to stay in one spot laying on her back and not move. She feels "sick because I am getting lots of meds and not eating".   She denies fever, chills, heartburn or reflux or passing gas in the past 24 hours.  ED course: -Afebrile, heart rate 90, respirations and pulse ox and blood pressure normal -Na 139, K 3.7, Cr 0.8, WBC 14.1K, Hgb 15.8 -lipase normal -UA without pyuria or hematura or bacteria on microscopic -FOBT negative -CT of the abdomen and pelvis with contrast was obtained that showed ileus/dilated bowel, and a 3.2x4.0cm soft tissue mass in the  sigmoid with narrowing of the lumen as well as areas of inflammation suggestive of colitis or diverticulitis  Previous GI history: 11/10/15-office visit, Dr. Turner Daniels time described increasing abdominal pain over the past week as well as constipation, was improved after starting a liquid diet the day before; plan-at that time. Patient had diverticulitis rather than a flare of Crohn's colitis and she was placed on ciprofloxacin 500 mg twice a day and metronidazole 500 mg 3 times a day for 7 days as well as started on MiraLAX and continued on a liquid diet, if pain not improved plan was for an abdominal/pelvic CT with oral and IV contrast, is also discussion would need a colonoscopy to assess her Crohn's activity pending how she felt 06/03/13-colonoscopy, Dr. Johney Frame: Sigmoid colon stricture and atrophic mucosa in the terminal ileum, findings were thought compatible with inactive Crohn's colitis and ileitis; at this time patient was only using azathioprine and Lialda when necessary January 2011-colonoscopy-diagnosed with Crohn's colitis (we do not have report); that time she was maintained on azathioprine and Lialda  Past Medical History:  Diagnosis Date  . ABNORMAL PAP SMEAR, LGSIL 02/11/2007   Qualifier: Diagnosis of  By: Sandi Mealy  MD, Judeth Cornfield    . Anemia   . Colitis   . Crohn's disease (HCC)   . Diabetes mellitus without complication (HCC)   . Diabetes mellitus, new onset (HCC) 02/28/2014  . Oropharyngeal candidiasis 02/28/2014    Past Surgical History:  Procedure Laterality Date  .  CHOLECYSTECTOMY      Family History  Problem Relation Age of Onset  . Colon cancer Maternal Grandmother   . Breast cancer      aunt  . Depression    . Depression Brother     committed suicide in [id number]  . Diabetes Mother   . Hyperlipidemia Mother   . Hypertension Mother   . Diabetes Father   . Hyperlipidemia Father   . Hypertension Father   . Arthritis Maternal Aunt     Social History    Substance Use Topics  . Smoking status: Current Every Day Smoker    Packs/day: 0.50    Types: Cigarettes  . Smokeless tobacco: Never Used  . Alcohol use No    Prior to Admission medications   Medication Sig Start Date End Date Taking? Authorizing Provider  acetaminophen (TYLENOL) 500 MG tablet Take 1,000 mg by mouth every 6 (six) hours as needed for moderate pain. For pain    Yes Historical Provider, MD  cholecalciferol (VITAMIN D) 1000 UNITS tablet Take 5,000 Units by mouth daily. Reported on 02/09/2015   Yes Historical Provider, MD  ferrous sulfate 325 (65 FE) MG tablet Take 325 mg by mouth daily with breakfast. Reported on 02/09/2015   Yes Historical Provider, MD  metFORMIN (GLUCOPHAGE XR) 500 MG 24 hr tablet Take 2 tablets (1,000 mg total) by mouth daily with breakfast. 04/26/15  Yes Kathee DeltonIan D McKeag, MD  Multiple Vitamin (MULTIVITAMIN) tablet Take 1 tablet by mouth daily. Reported on 02/09/2015   Yes Historical Provider, MD  omega-3 acid ethyl esters (LOVAZA) 1 G capsule Take 1 g by mouth daily. Reported on 02/09/2015   Yes Historical Provider, MD    Current Facility-Administered Medications  Medication Dose Route Frequency Provider Last Rate Last Dose  . 0.9 %  sodium chloride infusion   Intravenous Continuous Alberteen Samhristopher P Danford, MD 125 mL/hr at 11/13/15 0539    . ciprofloxacin (CIPRO) IVPB 400 mg  400 mg Intravenous BID Alberteen Samhristopher P Danford, MD   400 mg at 11/13/15 0900  . enoxaparin (LOVENOX) injection 40 mg  40 mg Subcutaneous Q24H Alberteen Samhristopher P Danford, MD      . famotidine (PEPCID) IVPB 20 mg premix  20 mg Intravenous Q24H Alberteen Samhristopher P Danford, MD   Stopped at 11/13/15 0011  . HYDROmorphone (DILAUDID) injection 0.5-1 mg  0.5-1 mg Intravenous Q4H PRN Alberteen Samhristopher P Danford, MD   1 mg at 11/13/15 0839  . insulin aspart (novoLOG) injection 0-9 Units  0-9 Units Subcutaneous Q4H Alberteen Samhristopher P Danford, MD   1 Units at 11/13/15 0430  . metroNIDAZOLE (FLAGYL) IVPB 500 mg  500 mg Intravenous  Q8H Alberteen Samhristopher P Danford, MD   500 mg at 11/13/15 0539  . ondansetron (ZOFRAN) tablet 4 mg  4 mg Oral Q6H PRN Alberteen Samhristopher P Danford, MD       Or  . ondansetron (ZOFRAN) injection 4 mg  4 mg Intravenous Q6H PRN Alberteen Samhristopher P Danford, MD   4 mg at 11/13/15 1029    Allergies as of 11/12/2015  . (No Known Allergies)     Review of Systems:     Constitutional: Positive for fatigue No weight loss, fever, chills or weakness HEENT: Eyes: No change in vision               Ears, Nose, Throat:  No change in hearing or congestion Skin: No rash or itching Cardiovascular: No chest pain, chest pressure or palpitations   Respiratory: No SOB or cough Gastrointestinal: See  HPI and otherwise negative Genitourinary: No dysuria or change in urinary frequency Neurological: No headache, dizziness or syncope Musculoskeletal: No new muscle or joint pain Hematologic: No bleeding or bruising Psychiatric: No history of depression or anxiety   Physical Exam:  Vital signs in last 24 hours: Temp:  [97.6 F (36.4 C)-98.2 F (36.8 C)] 97.6 F (36.4 C) (10/16 1027) Pulse Rate:  [52-90] 52 (10/16 1029) Resp:  [14-19] 14 (10/16 1027) BP: (112-177)/(62-78) 144/62 (10/16 1029) SpO2:  [96 %-100 %] 97 % (10/16 1027) Weight:  [167 lb (75.8 kg)] 167 lb (75.8 kg) (10/15 1704) Last BM Date: 11/12/15 General:   Pleasant African American in mild distress, uncomfortably lying on her back in the bed, Well developed, Well nourished, alert and cooperative Head:  Normocephalic and atraumatic. Eyes:   PEERL, EOMI. No icterus. Conjunctiva pink. Ears:  Normal auditory acuity. Neck:  Supple Throat: Oral cavity and pharynx without inflammation, swelling or lesion.  Lungs: Respirations even and unlabored. Lungs clear to auscultation bilaterally.   No wheezes, crackles, or rhonchi.  Heart: Normal S1, S2. No MRG. Regular rate and rhythm. No peripheral edema, cyanosis or pallor.  Abdomen:  Soft, Moderate distension, mild ttp  generalized, decreased bowel sounds all 4 quadrants No rebound or guarding.No appreciable masses or hepatomegaly. Rectal:  Not performed.  Msk:  Symmetrical without gross deformities. Peripheral pulses intact.  Extremities:  Without edema, no deformity or joint abnormality. Neurologic:  Alert and  oriented x4;  grossly normal neurologically.   Skin:   Dry and intact without significant lesions or rashes. Psychiatric: Oriented to person, place and time. Demonstrates good judgement and reason without abnormal affect or behaviors.   LAB RESULTS:  Recent Labs  11/12/15 1715  WBC 14.1*  HGB 15.8*  HCT 46.1*  PLT 577*   BMET  Recent Labs  11/12/15 1715 11/13/15 0415  NA 139 137  K 3.7 4.2  CL 103 102  CO2 24 26  GLUCOSE 123* 145*  BUN 18 16  CREATININE 0.79 0.74  CALCIUM 11.1* 10.6*   LFT  Recent Labs  11/12/15 1715  PROT 8.5*  ALBUMIN 4.5  AST 25  ALT 27  ALKPHOS 119  BILITOT 1.2   PT/INR No results for input(s): LABPROT, INR in the last 72 hours.  STUDIES: Ct Abdomen Pelvis W Contrast  Result Date: 11/12/2015 CLINICAL DATA:  55 year old female with abdominal. Patient is unable to tolerate food. EXAM: CT ABDOMEN AND PELVIS WITH CONTRAST TECHNIQUE: Multidetector CT imaging of the abdomen and pelvis was performed using the standard protocol following bolus administration of intravenous contrast. CONTRAST:  26mL ISOVUE-300 IOPAMIDOL (ISOVUE-300) INJECTION 61%, ISOVUE-300 IOPAMIDOL (ISOVUE-300) INJECTION 61% COMPARISON:  Abdominal radiograph dated 01/11 and dated 02/07/2009 FINDINGS: Lower chest: There is an 8 mm nodule at the right lung base (series 2, image 15) which similar the study dated 02/07/2009. The visualized lung bases is otherwise No intra-abdominal free air. Small pelvic ascites. Small amount of perihepatic free fluid is also noted. Hepatobiliary: Cholecystectomy. The liver appears unremarkable. No intrahepatic biliary ductal dilatation. Pancreas:  Unremarkable. No pancreatic ductal dilatation or surrounding inflammatory changes. Spleen: Normal in size without focal abnormality. Adrenals/Urinary Tract: Adrenal glands are unremarkable. Kidneys are normal, without renal calculi, focal lesion, or hydronephrosis. Bladder is unremarkable. Stomach/Bowel: There is segmental thickening and irregularity of the sigmoid colon. There is a 3.2 x 4.0 cm soft tissue mass involving the sigmoid colon with associated narrowing of the lumen of the colon. Although thickened segment of the  sigmoid colon may partly related to muscular hypertrophy related to recurrent diverticulitis, this masslike density is highly concerning for neoplasm. Further evaluation with colonoscopy recommended. No significant active inflammatory changes identified. Minimal perisigmoid haziness noted diffusely colonic fold represent mild diverticulitis. The colon is filled with liquid content compatible with diarrheal state. There is mild diffuse thickening of the colonic wall which may represent a degree of colitis. There is a small hiatal hernia. Multiple nondistended loops of small bowel with air-fluid level likely representing a degree of ileus. There is apparent segment of thickened loop of distal ileum in the left hemi abdomen (series 2, image 60) which may represent layering high attenuating content versus less likely segmental thickening of the bowel wall. There is no definite evidence of bowel obstruction. Normal appendix. Vascular/Lymphatic: There is mild aortoiliac atherosclerotic disease. The origins of the celiac axis, SMA, IMA as well as the origins of the renal arteries are patent. No portal venous gas identified. There is no adenopathy. Reproductive: The uterus and ovaries are grossly unremarkable. Other: None Musculoskeletal: Small fat containing umbilical hernia. Abdominal wall soft tissues appear unremarkable. The osseous structures are intact. IMPRESSION: Focal masslike lesion within the  sigmoid colon suspicious for neoplasm. Further evaluation with colonoscopy recommended. There is segmental thickening of the colon, likely partly related to chronic diverticulitis. Diarrheal state with findings of possible mild colitis. Correlation with clinical exam and stool cultures recommended. Small bowel ileus. Layering bowel contents versus less likely segmental thickening of the distal small bowel in the left hemipelvis. No evidence of bowel obstruction. Normal appendix. An 8 mm right lung base nodule similar to CT dated 02/07/2009 Electronically Signed   By: Elgie Collard M.D.   On: 11/12/2015 22:24     PREVIOUS ENDOSCOPIES:            See history of present illness   Impression / Plan:   Impression: 1. Abdominal distention: Increasing over the weekend, patient started vomiting yesterday, question whether this is related to possible stricture, patient does have a history of stricture noted on last colonoscopy in 2015, she has not been on maintenance medication for her Crohn's disease 2. History of Crohn's: See above, initially maintained on the out and azathioprine, had stopped taking these medications in 2015 and did well until recent office visit last week 3. Diverticulitis?: Patient started on metronidazole and ciprofloxacin by Dr. Myrtie Neither on 11/10/2015  Plan: 1. NPO for now, will discuss NG tube placement with Dr. Adela Lank 2. Continue supportive measures including antiemetics and pain medicine 3. Continue Cipro and Flagyl for now 4. ordered CRP 5. D/c'd enteric precautions, do not believe this represents norovirus or cdiff as patient has obvious ileus and is no longer vomiting and has not had a bowel mvmt in 24 hrs 6. Will discuss above with Dr. Adela Lank, patient may benefit from colonoscopy while in the hospital  Thank you for your kind consultation, we will continue to follow.  Violet Baldy Padme Arriaga  11/13/2015, 10:38 AM Pager #: [phone]

## 2015-11-14 ENCOUNTER — Encounter (HOSPITAL_COMMUNITY)
Admission: EM | Disposition: A | Discharge status: Home / Self Care | Payer: Self-pay | Source: Home / Self Care | Attending: Internal Medicine

## 2015-11-14 ENCOUNTER — Encounter (HOSPITAL_COMMUNITY): Payer: Self-pay

## 2015-11-14 DIAGNOSIS — K56699 Other intestinal obstruction unspecified as to partial versus complete obstruction: Secondary | ICD-10-CM

## 2015-11-14 HISTORY — PX: FLEXIBLE SIGMOIDOSCOPY: SHX5431

## 2015-11-14 LAB — HEMOGLOBIN A1C
HEMOGLOBIN A1C: 7.2 % — AB (ref 4.8–5.6)
Mean Plasma Glucose: 160 mg/dL

## 2015-11-14 LAB — GLUCOSE, CAPILLARY
GLUCOSE-CAPILLARY: 92 mg/dL (ref 65–99)
GLUCOSE-CAPILLARY: 67 mg/dL (ref 65–99)
GLUCOSE-CAPILLARY: 97 mg/dL (ref 65–99)
Glucose-Capillary: 70 mg/dL (ref 65–99)
Glucose-Capillary: 98 mg/dL (ref 65–99)
Glucose-Capillary: 71 mg/dL (ref 65–99)

## 2015-11-14 LAB — CBC
HCT: 39.1 % (ref 36.0–46.0)
Hemoglobin: 13 g/dL (ref 12.0–15.0)
MCH: 29.7 pg (ref 26.0–34.0)
MCHC: 33.2 g/dL (ref 30.0–36.0)
MCV: 89.3 fL (ref 78.0–100.0)
PLATELETS: 585 10*3/uL — AB (ref 150–400)
RBC: 4.38 MIL/uL (ref 3.87–5.11)
RDW: 13.1 % (ref 11.5–15.5)
WBC: 11.2 10*3/uL — AB (ref 4.0–10.5)

## 2015-11-14 LAB — BASIC METABOLIC PANEL
ANION GAP: 8 (ref 5–15)
BUN: 12 mg/dL (ref 6–20)
CALCIUM: 9.7 mg/dL (ref 8.9–10.3)
CO2: 24 mmol/L (ref 22–32)
Chloride: 102 mmol/L (ref 101–111)
Creatinine, Ser: 0.66 mg/dL (ref 0.44–1.00)
Glucose, Bld: 116 mg/dL — ABNORMAL HIGH (ref 65–99)
Potassium: 3.6 mmol/L (ref 3.5–5.1)
Sodium: 134 mmol/L — ABNORMAL LOW (ref 135–145)

## 2015-11-14 SURGERY — SIGMOIDOSCOPY, FLEXIBLE
Anesthesia: Moderate Sedation

## 2015-11-14 MED ORDER — FENTANYL CITRATE (PF) 100 MCG/2ML IJ SOLN
INTRAMUSCULAR | Status: AC
  Filled 2015-11-14: qty 2

## 2015-11-14 MED ORDER — FENTANYL CITRATE (PF) 100 MCG/2ML IJ SOLN
INTRAMUSCULAR | Status: DC | PRN
  Administered 2015-11-14 (×2): 12.5 ug via INTRAVENOUS

## 2015-11-14 MED ORDER — MIDAZOLAM HCL 5 MG/ML IJ SOLN
INTRAMUSCULAR | Status: AC
  Filled 2015-11-14: qty 2

## 2015-11-14 MED ORDER — MIDAZOLAM HCL 10 MG/2ML IJ SOLN
INTRAMUSCULAR | Status: DC | PRN
  Administered 2015-11-14: 1 mg via INTRAVENOUS
  Administered 2015-11-14: 2 mg via INTRAVENOUS

## 2015-11-14 MED ORDER — DIPHENHYDRAMINE HCL 50 MG/ML IJ SOLN
INTRAMUSCULAR | Status: AC
  Filled 2015-11-14: qty 1

## 2015-11-14 NOTE — H&P (View-Only) (Signed)
[facility] Progress Note  Subjective:  Complaining mostly about sore throat and wants NGT out.  Abdominal pain and abdominal distention improved.  Canister over half full of green fluid currently.   Objective:  Vital signs in last 24 hours: Temp:  [97.6 F (36.4 C)-98.1 F (36.7 C)] 98.1 F (36.7 C) (10/16 2031) Pulse Rate:  [52-64] 64 (10/16 2031) Resp:  [14-18] 18 (10/16 2031) BP: (122-177)/(62-77) 122/70 (10/16 2031) SpO2:  [96 %-97 %] 96 % (10/16 2031) Last BM Date: 11/11/15 General:  Alert, Well-developed, in NAD but appears uncomfortable from NGT. Heart:  Regular rate and rhythm; no murmurs Pulm:  CTAB.  No W/R/R. Abdomen:  Soft, minimally distended.  BS present but very quiet.  Minimal TTP.  Extremities:  Without edema. Neurologic:  Alert and oriented x 4;  grossly normal neurologically. Psych:  Alert and cooperative. Normal mood and affect.  Intake/Output from previous day: 10/16 0701 - 10/17 0700 In: 3710.4 [I.V.:3010.4; IV Piggyback:700] Out: 700 [Urine:200; Emesis/NG output:500]  Lab Results:  Recent Labs  11/12/15 1715  WBC 14.1*  HGB 15.8*  HCT 46.1*  PLT 577*   BMET  Recent Labs  11/12/15 1715 11/13/15 0415  NA 139 137  K 3.7 4.2  CL 103 102  CO2 24 26  GLUCOSE 123* 145*  BUN 18 16  CREATININE 0.79 0.74  CALCIUM 11.1* 10.6*   LFT  Recent Labs  11/12/15 1715  PROT 8.5*  ALBUMIN 4.5  AST 25  ALT 27  ALKPHOS 119  BILITOT 1.2   Ct Abdomen Pelvis W Contrast  Result Date: 11/12/2015 CLINICAL DATA:  55 year old female with abdominal. Patient is unable to tolerate food. EXAM: CT ABDOMEN AND PELVIS WITH CONTRAST TECHNIQUE: Multidetector CT imaging of the abdomen and pelvis was performed using the standard protocol following bolus administration of intravenous contrast. CONTRAST:  31mL ISOVUE-300 IOPAMIDOL (ISOVUE-300) INJECTION 61%, ISOVUE-300 IOPAMIDOL (ISOVUE-300) INJECTION 61% COMPARISON:  Abdominal radiograph  dated 01/11 and dated 02/07/2009 FINDINGS: Lower chest: There is an 8 mm nodule at the right lung base (series 2, image 15) which similar the study dated 02/07/2009. The visualized lung bases is otherwise No intra-abdominal free air. Small pelvic ascites. Small amount of perihepatic free fluid is also noted. Hepatobiliary: Cholecystectomy. The liver appears unremarkable. No intrahepatic biliary ductal dilatation. Pancreas: Unremarkable. No pancreatic ductal dilatation or surrounding inflammatory changes. Spleen: Normal in size without focal abnormality. Adrenals/Urinary Tract: Adrenal glands are unremarkable. Kidneys are normal, without renal calculi, focal lesion, or hydronephrosis. Bladder is unremarkable. Stomach/Bowel: There is segmental thickening and irregularity of the sigmoid colon. There is a 3.2 x 4.0 cm soft tissue mass involving the sigmoid colon with associated narrowing of the lumen of the colon. Although thickened segment of the sigmoid colon may partly related to muscular hypertrophy related to recurrent diverticulitis, this masslike density is highly concerning for neoplasm. Further evaluation with colonoscopy recommended. No significant active inflammatory changes identified. Minimal perisigmoid haziness noted diffusely colonic fold represent mild diverticulitis. The colon is filled with liquid content compatible with diarrheal state. There is mild diffuse thickening of the colonic wall which may represent a degree of colitis. There is a small hiatal hernia. Multiple nondistended loops of small bowel with air-fluid level likely representing a degree of ileus. There is apparent segment of thickened loop of distal ileum in the left hemi abdomen (series 2, image 60) which may represent layering high attenuating content versus less likely segmental thickening of the bowel wall. There is  no definite evidence of bowel obstruction. Normal appendix. Vascular/Lymphatic: There is mild aortoiliac  atherosclerotic disease. The origins of the celiac axis, SMA, IMA as well as the origins of the renal arteries are patent. No portal venous gas identified. There is no adenopathy. Reproductive: The uterus and ovaries are grossly unremarkable. Other: None Musculoskeletal: Small fat containing umbilical hernia. Abdominal wall soft tissues appear unremarkable. The osseous structures are intact. IMPRESSION: Focal masslike lesion within the sigmoid colon suspicious for neoplasm. Further evaluation with colonoscopy recommended. There is segmental thickening of the colon, likely partly related to chronic diverticulitis. Diarrheal state with findings of possible mild colitis. Correlation with clinical exam and stool cultures recommended. Small bowel ileus. Layering bowel contents versus less likely segmental thickening of the distal small bowel in the left hemipelvis. No evidence of bowel obstruction. Normal appendix. An 8 mm right lung base nodule similar to CT dated 02/07/2009 Electronically Signed   By: Arash  Radparvar M.D.   On: 11/12/2015 22:24   Assessment / Plan: 1. Abdominal pain and distention:  ? Ileus vs PSBO.  Question whether this is related to stenosis in the sigmoid colon that was seen on previous colonoscopy in 2015.  Has history of Crohn's disease and has not been on maintenance medication for that since 2015 (CRP only 1.6 currently).  Also has diverticular disease so ? Changes related to that/chronic diverticulitis (recently treated with course of PO cipro and flagyl) vs malignancy.  -Continue NGT to LIS for now with NPO. -Continue IV cipro and flagyl for now. -Will discuss with Dr. Armbruster regarding flex sig. -Await today's CBC and BMP.   LOS: 2 days   Nozomi Mettler D.  11/14/2015, 8:58 AM  Pager number [phone]   

## 2015-11-14 NOTE — Interval H&P Note (Signed)
History and Physical Interval Note:  11/14/2015 3:07 PM  Glenna DurandJudith M Laury  has presented today for surgery, with the diagnosis of abnormal CT scan  The various methods of treatment have been discussed with the patient and family. After consideration of risks, benefits and other options for treatment, the patient has consented to  Procedure(s): FLEXIBLE SIGMOIDOSCOPY (N/A) as a surgical intervention .  The patient's history has been reviewed, patient examined, no change in status, stable for surgery.  I have reviewed the patient's chart and labs.  Questions were answered to the patient's satisfaction.     Reeves ForthSteven Paul Jarita Raval

## 2015-11-14 NOTE — Op Note (Signed)
[facility] Patient Name: Elizabeth Andrade Procedure Date: 11/14/2015 MRN: [id number] Attending MD: Willaim Rayas. Adela Lank , MD Date of Birth: 08-21-1960 CSN: [id number] Age: 55 Admit Type: Inpatient Procedure:                Flexible Sigmoidoscopy Indications:              Abnormal CT of the GI tract - partial distal                            colonic obstruction, rule out malignancy. Providers:                Willaim Rayas. Adela Lank, MD, Waynard Edwards RN, RN,                            Kandice Robinsons, Technician Referring MD:              Medicines:                Fentanyl 25 micrograms IV, Midazolam 3 mg IV Complications:            No immediate complications. Estimated blood loss:                            None. Estimated Blood Loss:     Estimated blood loss: none. Procedure:                Pre-Anesthesia Assessment:                           - Prior to the procedure, a History and Physical                            was performed, and patient medications and                            allergies were reviewed. The patient's tolerance of                            previous anesthesia was also reviewed. The risks                            and benefits of the procedure and the sedation                            options and risks were discussed with the patient.                            All questions were answered, and informed consent                            was obtained. Prior Anticoagulants: The patient has                            taken Lovenox (enoxaparin), last dose was 1 day  prior to procedure. ASA Grade Assessment: III - A                            patient with severe systemic disease. After                            reviewing the risks and benefits, the patient was                            deemed in satisfactory condition to undergo the                            procedure.                           After obtaining informed  consent, the scope was                            passed under direct vision. The [id number] ([id number])                            scope was introduced through the anus and advanced                            to the the sigmoid colon. The flexible                            sigmoidoscopy was accomplished without difficulty.                            The patient tolerated the procedure well. The                            quality of the bowel preparation was adequate. Scope In: 3:29:44 PM Scope Out: 3:43:53 PM Total Procedure Duration: 0 hours 14 minutes 9 seconds  Findings:      The perianal and digital rectal examinations were normal.      A severe stenosis was found in the distal sigmoid colon and was not able       to be traversed due to poor visualization, significant edema. No       abnormal appearing / malignant tissue was observed, nor was there       evidence of active inflammation / ulceration concerning for Crohns. The       lumen was severely narrowed. The exam was done under water emersion and       even with this, could not visualize the lumen in this area.      The exam was otherwise normal throughout the examined colon. Multiple       suspected benign hyperplastic polyps were noted in the rectum.       Retroflexion was not performed given small size of rectum. 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 Unclear                            etiology - no active Crohns appreciated, perhaps                            related to severe diverticular disease. Moderate Sedation:      Moderate (conscious) sedation was administered by the endoscopy nurse       and supervised by the endoscopist. The following parameters were        monitored: oxygen saturation, heart rate, blood pressure, and response       to care. Total physician intraservice time was 23 minutes. Recommendation:           - Return patient to hospital ward for ongoing care.                           - Remain NPO with NG in place for now                           - Continue IV antibiotics                           - General surgery consultation Procedure Code(s):        --- Professional ---                           [phone] [id number], Sigmoidoscopy, flexible; diagnostic,                            including collection of specimen(s) by brushing or                            washing, when performed (separate procedure)                           [id number], Moderate sedation services provided by the                            same physician or other qualified health care                            professional performing the diagnostic or                            therapeutic service that the sedation supports,                            requiring the presence of an independent trained  observer to assist in the monitoring of the                            patient's level of consciousness and physiological                            status; initial 15 minutes of intraservice time,                            patient age 50 years or older                           [phone], Moderate sedation services; each additional                            15 minutes intraservice time Diagnosis Code(s):        --- Professional ---                           K56.69, Other intestinal obstruction                           R93.3, Abnormal findings on diagnostic imaging of                            other parts of digestive tract CPT copyright 2016 [employer]. All rights reserved. The codes documented in this report are preliminary and upon coder review may  be revised to meet current compliance requirements. Viviann Spare P. Armbruster, MD 11/14/2015  3:57:11 PM This report has been signed electronically. Number of Addenda: 0

## 2015-11-14 NOTE — Progress Notes (Signed)
PROGRESS NOTE    Elizabeth Andrade  ZOX:[id number] DOB: 09/15/1960 DOA: 11/12/2015 PCP: Mickie HillierIan McKeag, MD   Brief Narrative: 55 y.o. female with a past medical history significant for Crohn's colitis not currently on disease-specific therapy who presents with 1 week abominal discomfort, nausea and vomiting. Recently seen by her GI and started on cipro,flagyl with outpatient. Plan for flex sigmoidoscopy today.  Assessment & Plan:   # Intractable nausea, vomiting and abdominal distention likely partial bowel obstruction, likely colon : Patient has history of bowel stricture related with Crohn's disease. CT scan of abdomen consistent with possible colon mass, ileus and colitis. -Continue supportive care with  IV fluid and IV Pepcid. -currently has NG tube with low suction. Plan for flex sigmoidoscopy today as per Gi.  -Gen. surgery was consulted for further evaluation of bowel obstruction..  # Colitis in a patient with history of Crohn's disease: CT scan correlate with chronic diverticulitis. Continue IV ciprofloxacin and IV Flagyl. -Continue NPO -GI consult appreciated.  # Type 2 diabetes mellitus without complication, without long-term current use of insulin (HCC): Monitor blood sugar level. Continue sliding scale.watch for hypoglycemia since patient is NPO.    # An 8 mm right lung base nodule similar to CT dated 02/07/2009: Advised outpatient follow-up.  Continue current medical and supportive care.  DVT prophylaxis: Lovenox subcutaneous Code Status: Full code Family Communication: Husband at bedside Disposition Plan: Likely discharge home in 1-2 days.   Consultants:   Gastroenterologist  General surgery  Procedures: None Antimicrobials: Cipro and Flagyl started on October 13th by patient's gastroenterologist Dr. Myrtie Neitheranis.  Subjective: Patient was seen and examined at bedside. Patient has NG tube placement. See feels uncomfortable with the NG tube. Still has nausea but denied  vomiting. Patient reported that abdominal pain may be somewhat better today after NG tube placement. Has no bowel movement. Denied fever, chills, chest pain or shortness of breath. Patient's husband at bedside. Objective: Vitals:   11/13/15 1029 11/13/15 1207 11/13/15 2031 11/14/15 1504  BP: (!) 144/62 129/77 122/70 (!) 158/86  Pulse: (!) 52 60 64 62  Resp:  18 18 20   Temp:  97.7 F (36.5 C) 98.1 F (36.7 C) 98.3 F (36.8 C)  TempSrc:  Oral Oral   SpO2:  97% 96% 96%  Weight:      Height:        Intake/Output Summary (Last 24 hours) at 11/14/15 1515 Last data filed at 11/14/15 0544  Gross per 24 hour  Intake          3510.42 ml  Output              400 ml  Net          3110.42 ml   Filed Weights   11/12/15 1704  Weight: 75.8 kg (167 lb)    Examination:  General exam:Has NG tube placed. Not apparent distress  Respiratory system: Respiratory effort normal. Clear to auscultation bilateral. Cardiovascular system: Regular rate rhythm, S1-S2 normal. No pedal edema.. Gastrointestinal system: Abdomen soft, nondistended and diffuse tenderness. Bowel sounds sluggish. Central nervous system: Alert, awake and oriented. No focal neurological deficit. Extremities: Symmetric 5 x 5 power. Skin: No rashes, lesions or ulcers Psychiatry: Judgement and insight appear normal. Mood & affect appropriate.     Data Reviewed: I have personally reviewed following labs and imaging studies  CBC:  Recent Labs Lab 11/12/15 1715 11/14/15 1021  WBC 14.1* 11.2*  HGB 15.8* 13.0  HCT 46.1* 39.1  MCV 87.8 89.3  PLT 577* 585*   Basic Metabolic Panel:  Recent Labs Lab 11/12/15 1715 11/13/15 0415 11/14/15 1021  NA 139 137 134*  K 3.7 4.2 3.6  CL 103 102 102  CO2 24 26 24   GLUCOSE 123* 145* 116*  BUN 18 16 12   CREATININE 0.79 0.74 0.66  CALCIUM 11.1* 10.6* 9.7   GFR: Estimated Creatinine Clearance: 81.8 mL/min (by C-G formula based on SCr of 0.66 mg/dL). Liver Function  Tests:  Recent Labs Lab 11/12/15 1715  AST 25  ALT 27  ALKPHOS 119  BILITOT 1.2  PROT 8.5*  ALBUMIN 4.5    Recent Labs Lab 11/12/15 1715  LIPASE 17   No results for input(s): AMMONIA in the last 168 hours. Coagulation Profile: No results for input(s): INR, PROTIME in the last 168 hours. Cardiac Enzymes: No results for input(s): CKTOTAL, CKMB, CKMBINDEX, TROPONINI in the last 168 hours. BNP (last 3 results) No results for input(s): PROBNP in the last 8760 hours. HbA1C:  Recent Labs  11/13/15 0415  HGBA1C 7.2*   CBG:  Recent Labs Lab 11/13/15 2037 11/13/15 2338 11/14/15 0330 11/14/15 0737 11/14/15 1133  GLUCAP 86 138* 70 92 98   Lipid Profile: No results for input(s): CHOL, HDL, LDLCALC, TRIG, CHOLHDL, LDLDIRECT in the last 72 hours. Thyroid Function Tests: No results for input(s): TSH, T4TOTAL, FREET4, T3FREE, THYROIDAB in the last 72 hours. Anemia Panel: No results for input(s): VITAMINB12, FOLATE, FERRITIN, TIBC, IRON, RETICCTPCT in the last 72 hours. Sepsis Labs: No results for input(s): PROCALCITON, LATICACIDVEN in the last 168 hours.  Recent Results (from the past 240 hour(s))  Urine culture     Status: Abnormal (Preliminary result)   Collection Time: 11/12/15  7:34 PM  Result Value Ref Range Status   Specimen Description URINE, CLEAN CATCH  Final   Special Requests NONE  Final   Culture >=100,000 COLONIES/mL ESCHERICHIA COLI (A)  Final   Report Status PENDING  Incomplete         Radiology Studies: Ct Abdomen Pelvis W Contrast  Result Date: 11/12/2015 CLINICAL DATA:  55 year old female with abdominal. Patient is unable to tolerate food. EXAM: CT ABDOMEN AND PELVIS WITH CONTRAST TECHNIQUE: Multidetector CT imaging of the abdomen and pelvis was performed using the standard protocol following bolus administration of intravenous contrast. CONTRAST:  30mL ISOVUE-300 IOPAMIDOL (ISOVUE-300) INJECTION 61%, ISOVUE-300 IOPAMIDOL (ISOVUE-300)  INJECTION 61% COMPARISON:  Abdominal radiograph dated 01/11 and dated 02/07/2009 FINDINGS: Lower chest: There is an 8 mm nodule at the right lung base (series 2, image 15) which similar the study dated 02/07/2009. The visualized lung bases is otherwise No intra-abdominal free air. Small pelvic ascites. Small amount of perihepatic free fluid is also noted. Hepatobiliary: Cholecystectomy. The liver appears unremarkable. No intrahepatic biliary ductal dilatation. Pancreas: Unremarkable. No pancreatic ductal dilatation or surrounding inflammatory changes. Spleen: Normal in size without focal abnormality. Adrenals/Urinary Tract: Adrenal glands are unremarkable. Kidneys are normal, without renal calculi, focal lesion, or hydronephrosis. Bladder is unremarkable. Stomach/Bowel: There is segmental thickening and irregularity of the sigmoid colon. There is a 3.2 x 4.0 cm soft tissue mass involving the sigmoid colon with associated narrowing of the lumen of the colon. Although thickened segment of the sigmoid colon may partly related to muscular hypertrophy related to recurrent diverticulitis, this masslike density is highly concerning for neoplasm. Further evaluation with colonoscopy recommended. No significant active inflammatory changes identified. Minimal perisigmoid haziness noted diffusely colonic fold represent mild diverticulitis. The colon is filled with liquid content compatible with  diarrheal state. There is mild diffuse thickening of the colonic wall which may represent a degree of colitis. There is a small hiatal hernia. Multiple nondistended loops of small bowel with air-fluid level likely representing a degree of ileus. There is apparent segment of thickened loop of distal ileum in the left hemi abdomen (series 2, image 60) which may represent layering high attenuating content versus less likely segmental thickening of the bowel wall. There is no definite evidence of bowel obstruction. Normal appendix.  Vascular/Lymphatic: There is mild aortoiliac atherosclerotic disease. The origins of the celiac axis, SMA, IMA as well as the origins of the renal arteries are patent. No portal venous gas identified. There is no adenopathy. Reproductive: The uterus and ovaries are grossly unremarkable. Other: None Musculoskeletal: Small fat containing umbilical hernia. Abdominal wall soft tissues appear unremarkable. The osseous structures are intact. IMPRESSION: Focal masslike lesion within the sigmoid colon suspicious for neoplasm. Further evaluation with colonoscopy recommended. There is segmental thickening of the colon, likely partly related to chronic diverticulitis. Diarrheal state with findings of possible mild colitis. Correlation with clinical exam and stool cultures recommended. Small bowel ileus. Layering bowel contents versus less likely segmental thickening of the distal small bowel in the left hemipelvis. No evidence of bowel obstruction. Normal appendix. An 8 mm right lung base nodule similar to CT dated 02/07/2009 Electronically Signed   By: Elgie Collard M.D.   On: 11/12/2015 22:24        Scheduled Meds: . [MAR Hold] ciprofloxacin  400 mg Intravenous BID  . [MAR Hold] enoxaparin (LOVENOX) injection  40 mg Subcutaneous Q24H  . [MAR Hold] famotidine (PEPCID) IV  20 mg Intravenous Q24H  . [MAR Hold] insulin aspart  0-9 Units Subcutaneous Q4H  . [MAR Hold] metronidazole  500 mg Intravenous Q8H   Continuous Infusions: . sodium chloride 125 mL/hr at 11/14/15 0544     LOS: 2 days    Time spent: 26 minutes.    Dron Jaynie Collins, MD [facility] Pager [phone]  If 7PM-7AM, please contact night-coverage [email or web address] Password [email or web address] 11/14/2015, 3:14 PM

## 2015-11-14 NOTE — Progress Notes (Signed)
[facility] Progress Note  Subjective:  Complaining mostly about sore throat and wants NGT out.  Abdominal pain and abdominal distention improved.  Canister over half full of green fluid currently.   Objective:  Vital signs in last 24 hours: Temp:  [97.6 F (36.4 C)-98.1 F (36.7 C)] 98.1 F (36.7 C) (10/16 2031) Pulse Rate:  [52-64] 64 (10/16 2031) Resp:  [14-18] 18 (10/16 2031) BP: (122-177)/(62-77) 122/70 (10/16 2031) SpO2:  [96 %-97 %] 96 % (10/16 2031) Last BM Date: 11/11/15 General:  Alert, Well-developed, in NAD but appears uncomfortable from NGT. Heart:  Regular rate and rhythm; no murmurs Pulm:  CTAB.  No W/R/R. Abdomen:  Soft, minimally distended.  BS present but very quiet.  Minimal TTP.  Extremities:  Without edema. Neurologic:  Alert and oriented x 4;  grossly normal neurologically. Psych:  Alert and cooperative. Normal mood and affect.  Intake/Output from previous day: 10/16 0701 - 10/17 0700 In: 3710.4 [I.V.:3010.4; IV Piggyback:700] Out: 700 [Urine:200; Emesis/NG output:500]  Lab Results:  Recent Labs  11/12/15 1715  WBC 14.1*  HGB 15.8*  HCT 46.1*  PLT 577*   BMET  Recent Labs  11/12/15 1715 11/13/15 0415  NA 139 137  K 3.7 4.2  CL 103 102  CO2 24 26  GLUCOSE 123* 145*  BUN 18 16  CREATININE 0.79 0.74  CALCIUM 11.1* 10.6*   LFT  Recent Labs  11/12/15 1715  PROT 8.5*  ALBUMIN 4.5  AST 25  ALT 27  ALKPHOS 119  BILITOT 1.2   Ct Abdomen Pelvis W Contrast  Result Date: 11/12/2015 CLINICAL DATA:  55 year old female with abdominal. Patient is unable to tolerate food. EXAM: CT ABDOMEN AND PELVIS WITH CONTRAST TECHNIQUE: Multidetector CT imaging of the abdomen and pelvis was performed using the standard protocol following bolus administration of intravenous contrast. CONTRAST:  31mL ISOVUE-300 IOPAMIDOL (ISOVUE-300) INJECTION 61%, ISOVUE-300 IOPAMIDOL (ISOVUE-300) INJECTION 61% COMPARISON:  Abdominal radiograph  dated 01/11 and dated 02/07/2009 FINDINGS: Lower chest: There is an 8 mm nodule at the right lung base (series 2, image 15) which similar the study dated 02/07/2009. The visualized lung bases is otherwise No intra-abdominal free air. Small pelvic ascites. Small amount of perihepatic free fluid is also noted. Hepatobiliary: Cholecystectomy. The liver appears unremarkable. No intrahepatic biliary ductal dilatation. Pancreas: Unremarkable. No pancreatic ductal dilatation or surrounding inflammatory changes. Spleen: Normal in size without focal abnormality. Adrenals/Urinary Tract: Adrenal glands are unremarkable. Kidneys are normal, without renal calculi, focal lesion, or hydronephrosis. Bladder is unremarkable. Stomach/Bowel: There is segmental thickening and irregularity of the sigmoid colon. There is a 3.2 x 4.0 cm soft tissue mass involving the sigmoid colon with associated narrowing of the lumen of the colon. Although thickened segment of the sigmoid colon may partly related to muscular hypertrophy related to recurrent diverticulitis, this masslike density is highly concerning for neoplasm. Further evaluation with colonoscopy recommended. No significant active inflammatory changes identified. Minimal perisigmoid haziness noted diffusely colonic fold represent mild diverticulitis. The colon is filled with liquid content compatible with diarrheal state. There is mild diffuse thickening of the colonic wall which may represent a degree of colitis. There is a small hiatal hernia. Multiple nondistended loops of small bowel with air-fluid level likely representing a degree of ileus. There is apparent segment of thickened loop of distal ileum in the left hemi abdomen (series 2, image 60) which may represent layering high attenuating content versus less likely segmental thickening of the bowel wall. There is  no definite evidence of bowel obstruction. Normal appendix. Vascular/Lymphatic: There is mild aortoiliac  atherosclerotic disease. The origins of the celiac axis, SMA, IMA as well as the origins of the renal arteries are patent. No portal venous gas identified. There is no adenopathy. Reproductive: The uterus and ovaries are grossly unremarkable. Other: None Musculoskeletal: Small fat containing umbilical hernia. Abdominal wall soft tissues appear unremarkable. The osseous structures are intact. IMPRESSION: Focal masslike lesion within the sigmoid colon suspicious for neoplasm. Further evaluation with colonoscopy recommended. There is segmental thickening of the colon, likely partly related to chronic diverticulitis. Diarrheal state with findings of possible mild colitis. Correlation with clinical exam and stool cultures recommended. Small bowel ileus. Layering bowel contents versus less likely segmental thickening of the distal small bowel in the left hemipelvis. No evidence of bowel obstruction. Normal appendix. An 8 mm right lung base nodule similar to CT dated 02/07/2009 Electronically Signed   By: Elgie CollardArash  Radparvar M.D.   On: 11/12/2015 22:24   Assessment / Plan: 1. Abdominal pain and distention:  ? Ileus vs PSBO.  Question whether this is related to stenosis in the sigmoid colon that was seen on previous colonoscopy in 2015.  Has history of Crohn's disease and has not been on maintenance medication for that since 2015 (CRP only 1.6 currently).  Also has diverticular disease so ? Changes related to that/chronic diverticulitis (recently treated with course of PO cipro and flagyl) vs malignancy.  -Continue NGT to LIS for now with NPO. -Continue IV cipro and flagyl for now. -Will discuss with Dr. Adela LankArmbruster regarding flex sig. -Await today's CBC and BMP.   LOS: 2 days   Zoua Caporaso D.  11/14/2015, 8:58 AM  Pager number [phone]

## 2015-11-15 ENCOUNTER — Ambulatory Visit: Payer: BC Managed Care – PPO | Admitting: Physician Assistant

## 2015-11-15 ENCOUNTER — Encounter (HOSPITAL_COMMUNITY): Payer: Self-pay | Admitting: Gastroenterology

## 2015-11-15 DIAGNOSIS — K529 Noninfective gastroenteritis and colitis, unspecified: Secondary | ICD-10-CM

## 2015-11-15 DIAGNOSIS — E119 Type 2 diabetes mellitus without complications: Secondary | ICD-10-CM

## 2015-11-15 LAB — URINE CULTURE: Culture: >=100000 — AB

## 2015-11-15 LAB — GLUCOSE, CAPILLARY
GLUCOSE-CAPILLARY: 103 mg/dL — AB (ref 65–99)
GLUCOSE-CAPILLARY: 104 mg/dL — AB (ref 65–99)
GLUCOSE-CAPILLARY: 72 mg/dL (ref 65–99)
GLUCOSE-CAPILLARY: 79 mg/dL (ref 65–99)
GLUCOSE-CAPILLARY: 98 mg/dL (ref 65–99)
Glucose-Capillary: 75 mg/dL (ref 65–99)

## 2015-11-15 NOTE — Progress Notes (Addendum)
[facility] PROGRESS NOTE  Elizabeth DurandJudith M Andrade YQI:[id number] DOB: 1960-05-10 DOA: 11/12/2015  PCP: Mickie HillierIan McKeag, MD  Brief History/Interval Summary: 55 y.o.femalewith a past medical history significant for Crohn's colitis not currently on disease-specific therapywho presents with 1 week abominal discomfort, nausea and vomiting.  CT scan of the abdomen raised concern for possible colonic mass, ileus, and colitis. Patient was hospitalized and seen by gastroenterology.   Reason for Visit: Suspected colitis  Consultants: Gastroenterology and general surgery  Procedures: Flexible sigmoidoscopy 10/17  Antibiotics: Cipro and Flagyl  Subjective/Interval History: Patient feels much better this morning. She tells me that she has had 3 bowel movements. Denies any nausea or vomiting.  ROS: Denies any chest pain or shortness of breath  Objective:  Vital Signs  Vitals:   11/13/15 2031 11/14/15 1504 11/14/15 2006 11/15/15 0355  BP: 122/70 (!) 158/86 95/74 132/67  Pulse: 64 62 64 (!) 56  Resp: 18 20 18 18   Temp: 98.1 F (36.7 C) 98.3 F (36.8 C) 98.5 F (36.9 C) 98.9 F (37.2 C)  TempSrc: Oral  Oral Oral  SpO2: 96% 96% 94% 95%  Weight:      Height:        Intake/Output Summary (Last 24 hours) at 11/15/15 1229 Last data filed at 11/15/15 0300  Gross per 24 hour  Intake          3408.34 ml  Output              200 ml  Net          3208.34 ml   Filed Weights   11/12/15 1704  Weight: 75.8 kg (167 lb)    General appearance: alert, cooperative, appears stated age and no distress Resp: clear to auscultation bilaterally Cardio: regular rate and rhythm, S1, S2 normal, no murmur, click, rub or gallop GI: soft, non-tender; bowel sounds normal; no masses,  no organomegaly Extremities: extremities normal, atraumatic, no cyanosis or edema Neurologic: Awake, alert. Oriented 3. No focal neurological deficits.  Lab Results:  Data Reviewed: I have personally reviewed following  labs and imaging studies  CBC:  Recent Labs Lab 11/12/15 1715 11/14/15 1021  WBC 14.1* 11.2*  HGB 15.8* 13.0  HCT 46.1* 39.1  MCV 87.8 89.3  PLT 577* 585*    Basic Metabolic Panel:  Recent Labs Lab 11/12/15 1715 11/13/15 0415 11/14/15 1021  NA 139 137 134*  K 3.7 4.2 3.6  CL 103 102 102  CO2 24 26 24   GLUCOSE 123* 145* 116*  BUN 18 16 12   CREATININE 0.79 0.74 0.66  CALCIUM 11.1* 10.6* 9.7    GFR: Estimated Creatinine Clearance: 81.8 mL/min (by C-G formula based on SCr of 0.66 mg/dL).  Liver Function Tests:  Recent Labs Lab 11/12/15 1715  AST 25  ALT 27  ALKPHOS 119  BILITOT 1.2  PROT 8.5*  ALBUMIN 4.5     Recent Labs Lab 11/12/15 1715  LIPASE 17    HbA1C:  Recent Labs  11/13/15 0415  HGBA1C 7.2*    CBG:  Recent Labs Lab 11/14/15 2009 11/14/15 2319 11/15/15 0357 11/15/15 0728 11/15/15 1138  GLUCAP 67 97 75 72 98     Recent Results (from the past 240 hour(s))  Urine culture     Status: Abnormal   Collection Time: 11/12/15  7:34 PM  Result Value Ref Range Status   Specimen Description URINE, CLEAN CATCH  Final   Special Requests NONE  Final   Culture >=100,000 COLONIES/mL ESCHERICHIA COLI (A)  Final   Report Status 11/15/2015 FINAL  Final   Organism ID, Bacteria ESCHERICHIA COLI (A)  Final      Susceptibility   Escherichia coli - MIC*    AMPICILLIN 8 SENSITIVE Sensitive     CEFAZOLIN <=4 SENSITIVE Sensitive     CEFTRIAXONE <=1 SENSITIVE Sensitive     CIPROFLOXACIN <=0.25 SENSITIVE Sensitive     GENTAMICIN <=1 SENSITIVE Sensitive     IMIPENEM <=0.25 SENSITIVE Sensitive     NITROFURANTOIN <=16 SENSITIVE Sensitive     TRIMETH/SULFA <=20 SENSITIVE Sensitive     AMPICILLIN/SULBACTAM 4 SENSITIVE Sensitive     PIP/TAZO <=4 SENSITIVE Sensitive     Extended ESBL NEGATIVE Sensitive     * >=100,000 COLONIES/mL ESCHERICHIA COLI      Radiology Studies: No results found.   Medications:  Scheduled: . ciprofloxacin  400 mg  Intravenous BID  . enoxaparin (LOVENOX) injection  40 mg Subcutaneous Q24H  . famotidine (PEPCID) IV  20 mg Intravenous Q24H  . insulin aspart  0-9 Units Subcutaneous Q4H  . metronidazole  500 mg Intravenous Q8H   Continuous: . sodium chloride 100 mL/hr at 11/15/15 1032   CHE:NIDPOEUMPNTIR (DILAUDID) injection, ondansetron **OR** ondansetron (ZOFRAN) IV, phenol  Assessment/Plan:  Principal Problem:   Ileus (HCC) Active Problems:   Type 2 diabetes mellitus without complication, without long-term current use of insulin (HCC)   Crohn's colitis, without complications (HCC)   Vomiting   Colitis   Colonic mass   Abnormal CT scan, gastrointestinal tract    Intractable nausea, vomiting and abdominal distention likely partial bowel obstruction Patient has history of bowel stricture related with Crohn's disease. CT scan of abdomen consistent with possible colon mass, ileus and colitis. Gastroenterology was consulted. Patient underwent flexible sigmoidoscopy yesterday which showed sigmoid colon stenosis. The scope could not be advanced beyond the site. Seen by general surgery. Patient in the meantime has significantly improved. She's had bowel movements. She is feeling much better. Started on clear liquids.   Colitis in a patient with history of Crohn's disease CT scan showed findings suggestive of chronic diverticulitis along with colitis. Continue IV ciprofloxacin and IV Flagyl.   Suspected colonic mass Apparently no mass was noted on colonoscopy done just a few years ago. This will need further outpatient monitoring.  Type 2 diabetes mellitus without complication, without long-term current use of insulin Monitor blood sugar level. Continue sliding scale.    An 8 mm right lung base nodule similar to CT dated 02/07/2009 Advised outpatient follow-up.  Positive urine culture with Escherichia coli Sensitive to Cipro. Unclear if she was ever truly symptomatic.  DVT Prophylaxis: Lovenox     Code Status: Full code  Family Communication: Discussed with the patient  Disposition Plan: Management as outlined above.     LOS: 3 days   [facility]  [facility] Pager [phone] 11/15/2015, 12:29 PM  If 7PM-7AM, please contact night-coverage at [email or web address], password [facility]

## 2015-11-15 NOTE — Progress Notes (Signed)
Patient ID: Elizabeth Andrade, female   DOB: August 09, 1960, 55 y.o.   MRN: [id number] [facility] Progress Note:   1 Day Post-Op  Subjective: Mental status is clear.  Feeling much better and passing flatus and small BM Objective: Vital signs in last 24 hours: Temp:  [98.3 F (36.8 C)-98.9 F (37.2 C)] 98.9 F (37.2 C) (10/18 0355) Pulse Rate:  [56-64] 56 (10/18 0355) Resp:  [18-20] 18 (10/18 0355) BP: (95-158)/(67-86) 132/67 (10/18 0355) SpO2:  [94 %-96 %] 95 % (10/18 0355)  Intake/Output from previous day: 10/17 0701 - 10/18 0700 In: 3408.3 [I.V.:2658.3; IV Piggyback:750] Out: 200 [Emesis/NG output:200] Intake/Output this shift: No intake/output data recorded.  Physical Exam: Work of breathing is normal.  Abdomen nontender  Lab Results:  Results for orders placed or performed during the hospital encounter of 11/12/15 (from the past 48 hour(s))  Glucose, capillary     Status: Abnormal   Collection Time: 11/13/15 12:10 PM  Result Value Ref Range   Glucose-Capillary 128 (H) 65 - 99 mg/dL  C-reactive protein     Status: Abnormal   Collection Time: 11/13/15 12:34 PM  Result Value Ref Range   CRP 1.6 (H) <1.0 mg/dL    Comment: Performed at [facility]  Glucose, capillary     Status: Abnormal   Collection Time: 11/13/15  4:28 PM  Result Value Ref Range   Glucose-Capillary 118 (H) 65 - 99 mg/dL  Glucose, capillary     Status: None   Collection Time: 11/13/15  8:37 PM  Result Value Ref Range   Glucose-Capillary 86 65 - 99 mg/dL  Glucose, capillary     Status: Abnormal   Collection Time: 11/13/15 11:38 PM  Result Value Ref Range   Glucose-Capillary 138 (H) 65 - 99 mg/dL  Glucose, capillary     Status: None   Collection Time: 11/14/15  3:30 AM  Result Value Ref Range   Glucose-Capillary 70 65 - 99 mg/dL  Glucose, capillary     Status: None   Collection Time: 11/14/15  7:37 AM  Result Value Ref Range   Glucose-Capillary 92 65 - 99 mg/dL  CBC Once     Status:  Abnormal   Collection Time: 11/14/15 10:21 AM  Result Value Ref Range   WBC 11.2 (H) 4.0 - 10.5 K/uL   RBC 4.38 3.87 - 5.11 MIL/uL   Hemoglobin 13.0 12.0 - 15.0 g/dL   HCT 39.1 36.0 - 46.0 %   MCV 89.3 78.0 - 100.0 fL   MCH 29.7 26.0 - 34.0 pg   MCHC 33.2 30.0 - 36.0 g/dL   RDW 13.1 11.5 - 15.5 %   Platelets 585 (H) 150 - 400 K/uL  Basic metabolic panel Once     Status: Abnormal   Collection Time: 11/14/15 10:21 AM  Result Value Ref Range   Sodium 134 (L) 135 - 145 mmol/L   Potassium 3.6 3.5 - 5.1 mmol/L   Chloride 102 101 - 111 mmol/L   CO2 24 22 - 32 mmol/L   Glucose, Bld 116 (H) 65 - 99 mg/dL   BUN 12 6 - 20 mg/dL   Creatinine, Ser 0.66 0.44 - 1.00 mg/dL   Calcium 9.7 8.9 - 10.3 mg/dL   GFR calc non Af Amer >60 >60 mL/min   GFR calc Af Amer >60 >60 mL/min    Comment: (NOTE) The eGFR has been calculated using the CKD EPI equation. This calculation has not been validated in all clinical situations. eGFR's persistently <  60 mL/min signify possible Chronic Kidney Disease.    Anion gap 8 5 - 15  Glucose, capillary     Status: None   Collection Time: 11/14/15 11:33 AM  Result Value Ref Range   Glucose-Capillary 98 65 - 99 mg/dL  Glucose, capillary     Status: None   Collection Time: 11/14/15  4:34 PM  Result Value Ref Range   Glucose-Capillary 71 65 - 99 mg/dL  Glucose, capillary     Status: None   Collection Time: 11/14/15  8:09 PM  Result Value Ref Range   Glucose-Capillary 67 65 - 99 mg/dL  Glucose, capillary     Status: None   Collection Time: 11/14/15 11:19 PM  Result Value Ref Range   Glucose-Capillary 97 65 - 99 mg/dL  Glucose, capillary     Status: None   Collection Time: 11/15/15  3:57 AM  Result Value Ref Range   Glucose-Capillary 75 65 - 99 mg/dL  Glucose, capillary     Status: None   Collection Time: 11/15/15  7:28 AM  Result Value Ref Range   Glucose-Capillary 72 65 - 99 mg/dL    Radiology/Results: No results  found.  Anti-infectives: Anti-infectives    Start     Dose/Rate Route Frequency Ordered Stop   11/13/15 0115  ciprofloxacin (CIPRO) IVPB 400 mg     400 mg 200 mL/hr over 60 Minutes Intravenous 2 times daily 11/13/15 0106     11/13/15 0115  metroNIDAZOLE (FLAGYL) IVPB 500 mg     500 mg 100 mL/hr over 60 Minutes Intravenous Every 8 hours 11/13/15 0106        Assessment/Plan: Problem List: Patient Active Problem List   Diagnosis Date Noted  . Colitis 11/13/2015  . Colonic mass   . Abnormal CT scan, gastrointestinal tract   . Vomiting 11/12/2015  . Ileus ([place]) 11/12/2015  . Crohn's colitis, without complications ([place]) 38/25/0539  . Healthcare maintenance 03/29/2014  . Candida esophagitis ([place]) 03/01/2014  . Type 2 diabetes mellitus without complication, without long-term current use of insulin ([place]) 02/28/2014  . Depression with anxiety 02/28/2014  . History of alcohol abuse 02/28/2014  . Tobacco abuse 02/28/2014    Probable diverticular stricture with partial obstruction hopefully resolving.  Her favorite foods are peanuts, popcorn, and greens.  Will need to stay on liquids for a while as inflammation resolves.   1 Day Post-Op    LOS: 3 days   Matt B. Hassell Done, MD, [facility] [phone] beeper [phone]  11/15/2015 9:38 AM

## 2015-11-15 NOTE — Progress Notes (Signed)
     [facility] Progress Note  Subjective:  NGT removed last night.  Feels much better.  Is hungry and would like to try clear liquids.  Was seen by surgery and there are no plans for surgery currently as she seems to be improving clinically.    Objective:  Vital signs in last 24 hours: Temp:  [98.3 F (36.8 C)-98.9 F (37.2 C)] 98.9 F (37.2 C) (10/18 0355) Pulse Rate:  [56-64] 56 (10/18 0355) Resp:  [18-20] 18 (10/18 0355) BP: (95-158)/(67-86) 132/67 (10/18 0355) SpO2:  [94 %-96 %] 95 % (10/18 0355) Last BM Date: 11/12/15 General:  Alert, Well-developed, in NAD Heart:  Slightly bradycardic; no murmurs Pulm:  CTAB.  No W/R/R. Abdomen:  Soft, non-distended.  BS present.  Non-tender. Extremities:  Without edema. Neurologic:  Alert and oriented x 4;  grossly normal neurologically. Psych:  Alert and cooperative. Normal mood and affect.  Intake/Output from previous day: 10/17 0701 - 10/18 0700 In: 3408.3 [I.V.:2658.3; IV Piggyback:750] Out: 200 [Emesis/NG output:200]  Lab Results:  Recent Labs  11/12/15 1715 11/14/15 1021  WBC 14.1* 11.2*  HGB 15.8* 13.0  HCT 46.1* 39.1  PLT 577* 585*   BMET  Recent Labs  11/12/15 1715 11/13/15 0415 11/14/15 1021  NA 139 137 134*  K 3.7 4.2 3.6  CL 103 102 102  CO2 24 26 24   GLUCOSE 123* 145* 116*  BUN 18 16 12   CREATININE 0.79 0.74 0.66  CALCIUM 11.1* 10.6* 9.7   LFT  Recent Labs  11/12/15 1715  PROT 8.5*  ALBUMIN 4.5  AST 25  ALT 27  ALKPHOS 119  BILITOT 1.2   Assessment / Plan: 1. 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  Has history of Crohn's disease and has not been on maintenance medication for that since 2015 (CRP only 1.6 currently).  Also has diverticular disease so ? Changes related to that/chronic diverticulitis (recently treated with  course of PO cipro and flagyl) vs malignancy.  -COk for trial of clear liquids.  Surgery recommending no advancement past full liquids for a couple of weeks and then will need low fiber/low residue diet. -Continue IV cipro and flagyl for now, which can be changed to PO if tolerates liquids.  ? How long to continue. -There may be need for surgery in the future electively to prevent recurrence of this as well as helping to identify the source of this stenosis (malignancy was high suspicion by radiology but had a colonoscopy a couple of years ago and mucosa looked normal at flex sig 10/17).   LOS: 3 days   Darren Nodal D.  11/15/2015, 11:04 AM  Pager number [phone]

## 2015-11-16 DIAGNOSIS — K56699 Other intestinal obstruction unspecified as to partial versus complete obstruction: Secondary | ICD-10-CM

## 2015-11-16 LAB — BASIC METABOLIC PANEL
Anion gap: 6 (ref 5–15)
CALCIUM: 10.2 mg/dL (ref 8.9–10.3)
CO2: 27 mmol/L (ref 22–32)
CREATININE: 0.65 mg/dL (ref 0.44–1.00)
Chloride: 107 mmol/L (ref 101–111)
GFR calc Af Amer: >60 mL/min (ref >60)
GLUCOSE: 108 mg/dL — AB (ref 65–99)
POTASSIUM: 4.1 mmol/L (ref 3.5–5.1)
SODIUM: 140 mmol/L (ref 135–145)

## 2015-11-16 LAB — GLUCOSE, CAPILLARY
GLUCOSE-CAPILLARY: 105 mg/dL — AB (ref 65–99)
GLUCOSE-CAPILLARY: 118 mg/dL — AB (ref 65–99)
GLUCOSE-CAPILLARY: 131 mg/dL — AB (ref 65–99)
GLUCOSE-CAPILLARY: 93 mg/dL (ref 65–99)
Glucose-Capillary: 109 mg/dL — ABNORMAL HIGH (ref 65–99)
Glucose-Capillary: 121 mg/dL — ABNORMAL HIGH (ref 65–99)

## 2015-11-16 LAB — CBC
HCT: 38.8 % (ref 36.0–46.0)
Hemoglobin: 12.8 g/dL (ref 12.0–15.0)
MCH: 30 pg (ref 26.0–34.0)
MCHC: 33 g/dL (ref 30.0–36.0)
MCV: 90.9 fL (ref 78.0–100.0)
PLATELETS: 577 10*3/uL — AB (ref 150–400)
RBC: 4.27 MIL/uL (ref 3.87–5.11)
RDW: 13.4 % (ref 11.5–15.5)
WBC: 7.8 10*3/uL (ref 4.0–10.5)

## 2015-11-16 MED ORDER — FAMOTIDINE 20 MG PO TABS
20.0000 mg | ORAL_TABLET | Freq: Every day | ORAL | Status: DC
  Administered 2015-11-16 – 2015-11-17 (×2): 20 mg via ORAL
  Filled 2015-11-16 (×2): qty 1

## 2015-11-16 MED ORDER — HYDROCODONE-ACETAMINOPHEN 5-325 MG PO TABS
1.0000 | ORAL_TABLET | ORAL | Status: DC | PRN
  Administered 2015-11-16 – 2015-11-17 (×2): 2 via ORAL
  Filled 2015-11-16 (×3): qty 2

## 2015-11-16 MED ORDER — CIPROFLOXACIN HCL 500 MG PO TABS
500.0000 mg | ORAL_TABLET | Freq: Two times a day (BID) | ORAL | Status: DC
  Administered 2015-11-16 – 2015-11-17 (×3): 500 mg via ORAL
  Filled 2015-11-16 (×3): qty 1

## 2015-11-16 MED ORDER — METRONIDAZOLE 500 MG PO TABS
500.0000 mg | ORAL_TABLET | Freq: Three times a day (TID) | ORAL | Status: DC
  Administered 2015-11-16 – 2015-11-17 (×4): 500 mg via ORAL
  Filled 2015-11-16 (×4): qty 1

## 2015-11-16 NOTE — Progress Notes (Signed)
Agree with earlier shift assessment, no changes noted.  

## 2015-11-16 NOTE — Progress Notes (Signed)
     [facility] Progress Note  Subjective:  Feels ok.  No nausea, vomiting, or significant pain but does have some discomfort/cramping when it feels like the liquids are try to go through that are of her colon.  She is passing flatus, tolerating clear liquids, and having stools.  Has been advanced to full liquids this AM per surgery.  Objective:  Vital signs in last 24 hours: Temp:  [98.1 F (36.7 C)-98.8 F (37.1 C)] 98.1 F (36.7 C) (10/19 0500) Pulse Rate:  [58-61] 58 (10/19 0500) Resp:  [17-18] 17 (10/19 0500) BP: (119-128)/(60-73) 128/73 (10/19 0500) SpO2:  [98 %-99 %] 99 % (10/19 0500) Last BM Date: 11/15/15 General:  Alert, Well-developed, in NAD Heart:  Slightly bradycardic; no murmurs Pulm:  CTAB.  No W/R/R. Abdomen:  Soft, non-distended.  BS present.  Mild left sided TTP. Extremities:  Without edema. Neurologic:  Alert and oriented x 4;  grossly normal neurologically. Psych:  Alert and cooperative. Normal mood and affect.  Intake/Output from previous day: 10/18 0701 - 10/19 0700 In: 3585 [P.O.:960; I.V.:2325; IV Piggyback:300] Out: -  Intake/Output this shift: Total I/O In: 240 [P.O.:240] Out: -   Lab Results:  Recent Labs  11/14/15 1021 11/16/15 0559  WBC 11.2* 7.8  HGB 13.0 12.8  HCT 39.1 38.8  PLT 585* 577*   BMET  Recent Labs  11/14/15 1021 11/16/15 0559  NA 134* 140  K 3.6 4.1  CL 102 107  CO2 24 27  GLUCOSE 116* 108*  BUN 12 <5*  CREATININE 0.66 0.65  CALCIUM 9.7 10.2   Assessment / Plan: 1. 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 Has history of Crohn's disease and has not been on maintenance medication for that since 2015 (CRP only 1.6 currently). Also has diverticular disease so ? Changes related to that/chronic diverticulitis (recently treated with course of PO cipro  and flagyl) vs malignancy.  -Surgery recommending no advancement past full liquids for a couple of weeks and then will need low fiber/low residue diet. -Changed to PO cipro and flagyl.  Continue for a total of 2 weeks. -There will likely be need for surgery in the future electively to prevent recurrence of this as well as helping to identify the source of this stenosis (malignancy was high suspicion by radiology but had a colonoscopy a couple of years ago and mucosa looked normal at flex sig 10/17).  ? Repeat flex sig prior to surgery to reassess when active inflammation resolved.   LOS: 4 days   Darthula Desa D.  11/16/2015, 12:16 PM  Pager number [phone]

## 2015-11-16 NOTE — Progress Notes (Signed)
[facility] Progress Note  2 Days Post-Op  Subjective: Continuing to have bowel function - multiple loose BMs daily, + flatus. Ambulating well.  C/o intermittent pain and gurgling in the abdomen, like she can feel the liquids backing up through her colon. Denies nausea/vomiting   Objective: Vital signs in last 24 hours: Temp:  [98.1 F (36.7 C)-98.8 F (37.1 C)] 98.1 F (36.7 C) (10/19 0500) Pulse Rate:  [58-61] 58 (10/19 0500) Resp:  [17-18] 17 (10/19 0500) BP: (119-128)/(60-73) 128/73 (10/19 0500) SpO2:  [98 %-99 %] 99 % (10/19 0500) Last BM Date: 11/15/15  Intake/Output from previous day: 10/18 0701 - 10/19 0700 In: 3585 [P.O.:960; I.V.:2325; IV Piggyback:300] Out: -  Intake/Output this shift: No intake/output data recorded.  PE: Gen:  Alert, NAD, pleasant Pulm:  CTA, no W/R/R Abd: Soft, mild TTP LLQ and central abdomen, ND, +BS  Lab Results:   Recent Labs  11/14/15 1021 11/16/15 0559  WBC 11.2* 7.8  HGB 13.0 12.8  HCT 39.1 38.8  PLT 585* 577*   BMET  Recent Labs  11/14/15 1021 11/16/15 0559  NA 134* 140  K 3.6 4.1  CL 102 107  CO2 24 27  GLUCOSE 116* 108*  BUN 12 <5*  CREATININE 0.66 0.65  CALCIUM 9.7 10.2   CMP     Component Value Date/Time   NA 140 11/16/2015 0559   K 4.1 11/16/2015 0559   CL 107 11/16/2015 0559   CO2 27 11/16/2015 0559   GLUCOSE 108 (H) 11/16/2015 0559   BUN <5 (L) 11/16/2015 0559   CREATININE 0.65 11/16/2015 0559   CALCIUM 10.2 11/16/2015 0559   PROT 8.5 (H) 11/12/2015 1715   ALBUMIN 4.5 11/12/2015 1715   AST 25 11/12/2015 1715   ALT 27 11/12/2015 1715   ALKPHOS 119 11/12/2015 1715   BILITOT 1.2 11/12/2015 1715   GFRNONAA >60 11/16/2015 0559   GFRAA >60 11/16/2015 0559   Lipase     Component Value Date/Time   LIPASE 17 11/12/2015 1715    Anti-infectives: Anti-infectives    Start     Dose/Rate Route Frequency Ordered Stop   11/13/15 0115  ciprofloxacin (CIPRO) IVPB 400 mg     400 mg 200  mL/hr over 60 Minutes Intravenous 2 times daily 11/13/15 0106     11/13/15 0115  metroNIDAZOLE (FLAGYL) IVPB 500 mg     500 mg 100 mL/hr over 60 Minutes Intravenous Every 8 hours 11/13/15 0106       Assessment/Plan Abdominal pain Bowel obstruction - sigmoid stenosis appreciated on colonoscopy in 2015 - flex sig 10/17 revealed severe sigmoid stenosis that could not be traversed - NGT removed 10/18 - having bowel function - flatus and stool - continue clear-full liquid, low-fiber diet for 2 weeks   PMH Crohn's disease PMH Diverticulosis   FEN: full liquid diet ID: cipro/flagyl 10/16 >> VTE: lovenox, SCD's  Plan: control pain and symptoms by maintaining the above diet, allow inflammation to decrease over the next couple of weeks prior to repeating flex six and outpatient follow up with general surgery to discuss elective resection.    LOS: 4 days    Adam Phenix , [facility] 11/16/2015, 7:41 AM Pager: [phone] Consults: [phone] Mon-Fri 7:00 am-4:30 pm Sat-Sun 7:00 am-11:30 am

## 2015-11-16 NOTE — Progress Notes (Signed)
[facility] PROGRESS NOTE  Elizabeth DurandJudith M Andrade ZOX:[id number] DOB: 02-01-1960 DOA: 11/12/2015  PCP: Elizabeth HillierIan McKeag, MD  Brief History/Interval Summary: 55 y.o.femalewith a past medical history significant for Crohn's colitis not currently on disease-specific therapywho presents with 1 week abominal discomfort, nausea and vomiting.  CT scan of the abdomen raised concern for possible colonic mass, ileus, and colitis. Patient was hospitalized and seen by gastroenterology.   Reason for Visit: Suspected colitis  Consultants: Gastroenterology and general surgery  Procedures: Flexible sigmoidoscopy 10/17  Antibiotics: Cipro and Flagyl  Subjective/Interval History: Patient had multiple bowel movements yesterday. She is tolerating her liquid diet. She did have some abdominal cramps this morning associated with some pain. Overall feels better. Denies any nausea or vomiting.  ROS: Denies any chest pain or shortness of breath  Objective:  Vital Signs  Vitals:   11/15/15 0355 11/15/15 1425 11/15/15 2013 11/16/15 0500  BP: 132/67 123/60 119/69 128/73  Pulse: (!) 56 61 60 (!) 58  Resp: 18 18 18 17   Temp: 98.9 F (37.2 C) 98.1 F (36.7 C) 98.8 F (37.1 C) 98.1 F (36.7 C)  TempSrc: Oral Oral Oral Oral  SpO2: 95% 99% 98% 99%  Weight:      Height:        Intake/Output Summary (Last 24 hours) at 11/16/15 0936 Last data filed at 11/16/15 0556  Gross per 24 hour  Intake             3585 ml  Output                0 ml  Net             3585 ml   Filed Weights   11/12/15 1704  Weight: 75.8 kg (167 lb)    General appearance: alert, cooperative, appears stated age and no distress Resp: clear to auscultation bilaterally Cardio: regular rate and rhythm, S1, S2 normal, no murmur, click, rub or gallop GI: Remains soft, non-tender; bowel sounds normal; no masses,  no organomegaly Extremities: extremities normal, atraumatic, no cyanosis or edema Neurologic: Awake, alert. Oriented 3.  No focal neurological deficits.  Lab Results:  Data Reviewed: I have personally reviewed following labs and imaging studies  CBC:  Recent Labs Lab 11/12/15 1715 11/14/15 1021 11/16/15 0559  WBC 14.1* 11.2* 7.8  HGB 15.8* 13.0 12.8  HCT 46.1* 39.1 38.8  MCV 87.8 89.3 90.9  PLT 577* 585* 577*    Basic Metabolic Panel:  Recent Labs Lab 11/12/15 1715 11/13/15 0415 11/14/15 1021 11/16/15 0559  NA 139 137 134* 140  K 3.7 4.2 3.6 4.1  CL 103 102 102 107  CO2 24 26 24 27   GLUCOSE 123* 145* 116* 108*  BUN 18 16 12  <5*  CREATININE 0.79 0.74 0.66 0.65  CALCIUM 11.1* 10.6* 9.7 10.2    GFR: Estimated Creatinine Clearance: 81.8 mL/min (by C-G formula based on SCr of 0.65 mg/dL).  Liver Function Tests:  Recent Labs Lab 11/12/15 1715  AST 25  ALT 27  ALKPHOS 119  BILITOT 1.2  PROT 8.5*  ALBUMIN 4.5     Recent Labs Lab 11/12/15 1715  LIPASE 17    CBG:  Recent Labs Lab 11/15/15 1648 11/15/15 2016 11/15/15 2359 11/16/15 0410 11/16/15 0731  GLUCAP 79 104* 103* 93 105*     Recent Results (from the past 240 hour(s))  Urine culture     Status: Abnormal   Collection Time: 11/12/15  7:34 PM  Result Value Ref Range Status  Specimen Description URINE, CLEAN CATCH  Final   Special Requests NONE  Final   Culture >=100,000 COLONIES/mL ESCHERICHIA COLI (A)  Final   Report Status 11/15/2015 FINAL  Final   Organism ID, Bacteria ESCHERICHIA COLI (A)  Final      Susceptibility   Escherichia coli - MIC*    AMPICILLIN 8 SENSITIVE Sensitive     CEFAZOLIN <=4 SENSITIVE Sensitive     CEFTRIAXONE <=1 SENSITIVE Sensitive     CIPROFLOXACIN <=0.25 SENSITIVE Sensitive     GENTAMICIN <=1 SENSITIVE Sensitive     IMIPENEM <=0.25 SENSITIVE Sensitive     NITROFURANTOIN <=16 SENSITIVE Sensitive     TRIMETH/SULFA <=20 SENSITIVE Sensitive     AMPICILLIN/SULBACTAM 4 SENSITIVE Sensitive     PIP/TAZO <=4 SENSITIVE Sensitive     Extended ESBL NEGATIVE Sensitive     * >=100,000  COLONIES/mL ESCHERICHIA COLI      Radiology Studies: No results found.   Medications:  Scheduled: . ciprofloxacin  500 mg Oral BID  . enoxaparin (LOVENOX) injection  40 mg Subcutaneous Q24H  . famotidine  20 mg Oral Daily  . insulin aspart  0-9 Units Subcutaneous Q4H  . metroNIDAZOLE  500 mg Oral Q8H   Continuous: . sodium chloride 100 mL/hr at 11/16/15 0556   NID:[id number], HYDROmorphone (DILAUDID) injection, ondansetron **OR** ondansetron (ZOFRAN) IV, phenol  Assessment/Plan:  Principal Problem:   Ileus (HCC) Active Problems:   Type 2 diabetes mellitus without complication, without long-term current use of insulin (HCC)   Crohn's colitis, without complications (HCC)   Vomiting   Colitis   Colonic mass   Abnormal CT scan, gastrointestinal tract    Intractable nausea, vomiting and abdominal distention likely partial bowel obstruction Patient has history of bowel stricture related with Crohn's disease. CT scan of abdomen consistent with possible colon mass, ileus and colitis. Gastroenterology was consulted. Patient underwent flexible sigmoidoscopy 10/17 which showed sigmoid colon stenosis. The scope could not be advanced beyond the site. Seen by general surgery. Patient in the meantime has improved. She's had bowel movements. She is feeling much better. Advance to full liquids by general surgery. Change to oral antibiotics today. Will need to be discharged on liquid diet when medically ready.  Colitis in a patient with history of Crohn's disease CT scan showed findings suggestive of chronic diverticulitis along with colitis. Patient was started on IV Cipro and Flagyl which will be changed over to oral today.   Suspected colonic mass Apparently no mass was noted on colonoscopy done just a few years ago. This will need further outpatient monitoring.  Type 2 diabetes mellitus without complication, without long-term current use of insulin Monitor blood sugar  level. Continue sliding scale. HbA1c 7.2.    An 8 mm right lung base nodule similar to CT dated 02/07/2009 Will need outpatient follow-up.  Positive urine culture with Escherichia coli Sensitive to Cipro. Unclear if she was ever truly symptomatic.  DVT Prophylaxis: Lovenox    Code Status: Full code  Family Communication: Discussed with the patient  Disposition Plan: Management as outlined above. Ambulate in the hallways. Anticipate discharge in 1-2 days.     LOS: 4 days   [facility]  [facility] Pager [phone] 11/16/2015, 9:36 AM  If 7PM-7AM, please contact night-coverage at [email or web address], password [facility]

## 2015-11-17 LAB — GLUCOSE, CAPILLARY
GLUCOSE-CAPILLARY: 115 mg/dL — AB (ref 65–99)
GLUCOSE-CAPILLARY: 111 mg/dL — AB (ref 65–99)
Glucose-Capillary: 128 mg/dL — ABNORMAL HIGH (ref 65–99)

## 2015-11-17 MED ORDER — FAMOTIDINE 20 MG PO TABS: 20.0000 mg | ORAL_TABLET | Freq: Every day | ORAL | 0 refills | Status: DC

## 2015-11-17 MED ORDER — CIPROFLOXACIN HCL 500 MG PO TABS: 500.0000 mg | ORAL_TABLET | Freq: Two times a day (BID) | ORAL | 0 refills | Status: DC

## 2015-11-17 MED ORDER — METRONIDAZOLE 500 MG PO TABS: 500.0000 mg | ORAL_TABLET | Freq: Three times a day (TID) | ORAL | 0 refills | Status: DC

## 2015-11-17 MED ORDER — HYDROCODONE-ACETAMINOPHEN 5-325 MG PO TABS: 1.0000 | ORAL_TABLET | ORAL | 0 refills | Status: DC | PRN

## 2015-11-17 NOTE — Discharge Instructions (Signed)
Low fiber diet attachment.    Colitis Colitis is inflammation of the colon. Colitis may last a short time (acute) or it may last a long time (chronic). CAUSES This condition may be caused by:  Viruses.  Bacteria.  Reactions to medicine.  Certain autoimmune diseases, such as Crohn disease or ulcerative colitis. SYMPTOMS Symptoms of this condition include:  Diarrhea.  Passing bloody or tarry stool.  Pain.  Fever.  Vomiting.  Tiredness (fatigue).  Weight loss.  Bloating.  Sudden increase in abdominal pain.  Having fewer bowel movements than usual. DIAGNOSIS This condition is diagnosed with a stool test or a blood test. You may also have other tests, including X-rays, a CT scan, or a colonoscopy. TREATMENT Treatment may include:  Resting the bowel. This involves not eating or drinking for a period of time.  Fluids that are given through an IV tube.  Medicine for pain and diarrhea.  Antibiotic medicines.  Cortisone medicines.  Surgery. HOME CARE INSTRUCTIONS Eating and Drinking  Follow instructions from your health care provider about eating or drinking restrictions.  Drink enough fluid to keep your urine clear or pale yellow.  Work with a dietitian to determine which foods cause your condition to flare up.  Avoid foods that cause flare-ups.  Eat a well-balanced diet. Medicines  Take over-the-counter and prescription medicines only as told by your health care provider.  If you were prescribed an antibiotic medicine, take it as told by your health care provider. Do not stop taking the antibiotic even if you start to feel better. General Instructions  Keep all follow-up visits as told by your health care provider. This is important. SEEK MEDICAL CARE IF:  Your symptoms do not go away.  You develop new symptoms. SEEK IMMEDIATE MEDICAL CARE IF:  You have a fever that does not go away with treatment.  You develop chills.  You have extreme  weakness, fainting, or dehydration.  You have repeated vomiting.  You develop severe pain in your abdomen.  You pass bloody or tarry stool.   This information is not intended to replace advice given to you by your health care provider. Make sure you discuss any questions you have with your health care provider.   Document Released: 02/22/2004 Document Revised: 10/05/2014 Document Reviewed: 05/09/2014 Elsevier Interactive Patient Education 2016 [employer] Inc[employer]  Low-Fiber Diet Fiber is found in fruits, vegetables, and whole grains. A low-fiber diet restricts fibrous foods that are not digested in the small intestine. A diet containing about 10-15 grams of fiber per day is considered low fiber. Low-fiber diets may be used to:  Promote healing and rest the bowel during intestinal flare-ups.  Prevent blockage of a partially obstructed or narrowed gastrointestinal tract.  Reduce fecal weight and volume.  Slow the movement of feces. You may be on a low-fiber diet as a transitional diet following surgery, after an injury (trauma), or because of a short (acute) or lifelong (chronic) illness. Your health care provider will determine the length of time you need to stay on this diet.  WHAT DO I NEED TO KNOW ABOUT A LOW-FIBER DIET? Always check the fiber content on the packaging's Nutrition Facts label, especially on foods from the grains list. Ask your dietitian if you have questions about specific foods that are related to your condition, especially if the food is not listed below. In general, a low-fiber food will have less than 2 g of fiber. WHAT FOODS CAN I EAT? Grains All breads and crackers made with  white flour. Sweet rolls, doughnuts, waffles, pancakes, [place] toast, bagels. Pretzels, Melba toast, zwieback. Well-cooked cereals, such as cornmeal, farina, or cream cereals. Dry cereals that do not contain whole grains, fruit, or nuts, such as refined corn, wheat, rice, and oat cereals. Potatoes  prepared any way without skins, plain pastas and noodles, refined white rice. Use white flour for baking and making sauces. Use allowed list of grains for casseroles, dumplings, and puddings.  Vegetables Strained tomato and vegetable juices. Fresh lettuce, cucumber, spinach. Well-cooked (no skin or pulp) or canned vegetables, such as asparagus, bean sprouts, beets, carrots, green beans, mushrooms, potatoes, pumpkin, spinach, yellow squash, tomato sauce/puree, turnips, yams, and zucchini. Keep servings limited to  cup.  Fruits All fruit juices except prune juice. Cooked or canned fruits without skin and seeds, such as applesauce, apricots, cherries, fruit cocktail, grapefruit, grapes, mandarin oranges, melons, peaches, pears, pineapple, and plums. Fresh fruits without skin, such as apricots, avocados, bananas, melons, pineapple, nectarines, and peaches. Keep servings limited to  cup or 1 piece.  Meat and Other Protein Sources Ground or well-cooked tender beef, ham, veal, lamb, pork, or poultry. Eggs, plain cheese. Fish, oysters, shrimp, lobster, and other seafood. Liver, organ meats. Smooth nut butters. Dairy All milk products and alternative dairy substitutes, such as soy, rice, almond, and coconut, not containing added whole nuts, seeds, or added fruit. Beverages Decaf coffee, fruit, and vegetable juices or smoothies (small amounts, with no pulp or skins, and with fruits from allowed list), sports drinks, herbal tea. Condiments Ketchup, mustard, vinegar, cream sauce, cheese sauce, cocoa powder. Spices in moderation, such as allspice, basil, bay leaves, celery powder or leaves, cinnamon, cumin powder, curry powder, ginger, mace, marjoram, onion or garlic powder, oregano, paprika, parsley flakes, ground pepper, rosemary, sage, savory, tarragon, thyme, and turmeric. Sweets and Desserts Plain cakes and cookies, pie made with allowed fruit, pudding, custard, cream pie. Gelatin, fruit, ice, sherbet,  frozen ice pops. Ice cream, ice milk without nuts. Plain hard candy, honey, jelly, molasses, syrup, sugar, chocolate syrup, gumdrops, marshmallows. Limit overall sugar intake.  Fats and Oil Margarine, butter, cream, mayonnaise, salad oils, plain salad dressings made from allowed foods. Choose healthy fats such as olive oil, canola oil, and omega-3 fatty acids (such as found in salmon or tuna) when possible.  Other Bouillon, broth, or cream soups made from allowed foods. Any strained soup. Casseroles or mixed dishes made with allowed foods. The items listed above may not be a complete list of recommended foods or beverages. Contact your dietitian for more options.  WHAT FOODS ARE NOT RECOMMENDED? Grains All whole wheat and whole grain breads and crackers. Multigrains, rye, bran seeds, nuts, or coconut. Cereals containing whole grains, multigrains, bran, coconut, nuts, raisins. Cooked or dry oatmeal, steel-cut oats. Coarse wheat cereals, granola. Cereals advertised as high fiber. Potato skins. Whole grain pasta, wild or brown rice. Popcorn. Coconut flour. Bran, buckwheat, corn bread, multigrains, rye, wheat germ.  Vegetables Fresh, cooked or canned vegetables, such as artichokes, asparagus, beet greens, broccoli, Brussels sprouts, cabbage, celery, cauliflower, corn, eggplant, kale, legumes or beans, okra, peas, and tomatoes. Avoid large servings of any vegetables, especially raw vegetables.  Fruits Fresh fruits, such as apples with or without skin, berries, cherries, figs, grapes, grapefruit, guavas, kiwis, mangoes, oranges, papayas, pears, persimmons, pineapple, and pomegranate. Prune juice and juices with pulp, stewed or dried prunes. Dried fruits, dates, raisins. Fruit seeds or skins. Avoid large servings of all fresh fruits. Meats and Other Protein Sources Tough, fibrous meats  with gristle. Chunky nut butter. Cheese made with seeds, nuts, or other foods not recommended. Nuts, seeds, legumes (beans,  including baked beans), dried peas, beans, lentils.  Dairy Yogurt or cheese that contains nuts, seeds, or added fruit.  Beverages Fruit juices with high pulp, prune juice. Caffeinated coffee and teas.  Condiments Coconut, maple syrup, pickles, olives. Sweets and Desserts Desserts, cookies, or candies that contain nuts or coconut, chunky peanut butter, dried fruits. Jams, preserves with seeds, marmalade. Large amounts of sugar and sweets. Any other dessert made with fruits from the not recommended list.  Other Soups made from vegetables that are not recommended or that contain other foods not recommended.  The items listed above may not be a complete list of foods and beverages to avoid. Contact your dietitian for more information.   This information is not intended to replace advice given to you by your health care provider. Make sure you discuss any questions you have with your health care provider.   Document Released: 07/06/2001 Document Revised: 01/19/2013 Document Reviewed: 12/07/2012 Elsevier Interactive Patient Education [employer]

## 2015-11-17 NOTE — Progress Notes (Signed)
[facility] Progress Note  3 Days Post-Op  Subjective: Improving. Pain decreased. Still with some "gurgling". Having flatus and loose BMs. Denies nausea/vomiting.  Objective: Vital signs in last 24 hours: Temp:  [98.4 F (36.9 C)-98.8 F (37.1 C)] 98.4 F (36.9 C) (10/20 0349) Pulse Rate:  [54-63] 54 (10/20 0349) Resp:  [16-18] 18 (10/20 0349) BP: (123-128)/(73-78) 123/78 (10/20 0349) SpO2:  [98 %] 98 % (10/20 0349) Last BM Date: 11/16/15  Intake/Output from previous day: 10/19 0701 - 10/20 0700 In: 480 [P.O.:480] Out: -  Intake/Output this shift: No intake/output data recorded.  PE: Gen:  Alert, NAD, pleasant Abd: Soft, NT/ND, +BS, no HSM, incisions C/D/I, drain with minimal sanguinous drainage, no abdominal scars noted Ext:  No erythema, edema, or tenderness   Lab Results:   Recent Labs  11/16/15 0559  WBC 7.8  HGB 12.8  HCT 38.8  PLT 577*   BMET  Recent Labs  11/16/15 0559  NA 140  K 4.1  CL 107  CO2 27  GLUCOSE 108*  BUN <5*  CREATININE 0.65  CALCIUM 10.2   CMP     Component Value Date/Time   NA 140 11/16/2015 0559   K 4.1 11/16/2015 0559   CL 107 11/16/2015 0559   CO2 27 11/16/2015 0559   GLUCOSE 108 (H) 11/16/2015 0559   BUN <5 (L) 11/16/2015 0559   CREATININE 0.65 11/16/2015 0559   CALCIUM 10.2 11/16/2015 0559   PROT 8.5 (H) 11/12/2015 1715   ALBUMIN 4.5 11/12/2015 1715   AST 25 11/12/2015 1715   ALT 27 11/12/2015 1715   ALKPHOS 119 11/12/2015 1715   BILITOT 1.2 11/12/2015 1715   GFRNONAA >60 11/16/2015 0559   GFRAA >60 11/16/2015 0559   Lipase     Component Value Date/Time   LIPASE 17 11/12/2015 1715       Studies/Results: No results found.  Anti-infectives: Anti-infectives    Start     Dose/Rate Route Frequency Ordered Stop   11/17/15 0000  ciprofloxacin (CIPRO) 500 MG tablet     500 mg Oral 2 times daily 11/17/15 1036     11/17/15 0000  metroNIDAZOLE (FLAGYL) 500 MG tablet     500 mg Oral Every 8 hours  11/17/15 1036     11/16/15 1400  metroNIDAZOLE (FLAGYL) tablet 500 mg     500 mg Oral Every 8 hours 11/16/15 0936     11/16/15 1100  ciprofloxacin (CIPRO) tablet 500 mg     500 mg Oral 2 times daily 11/16/15 0936     11/13/15 0115  ciprofloxacin (CIPRO) IVPB 400 mg  Status:  Discontinued     400 mg 200 mL/hr over 60 Minutes Intravenous 2 times daily 11/13/15 0106 11/16/15 0936   11/13/15 0115  metroNIDAZOLE (FLAGYL) IVPB 500 mg  Status:  Discontinued     500 mg 100 mL/hr over 60 Minutes Intravenous Every 8 hours 11/13/15 0106 11/16/15 [id number]     Assessment/Plan Abdominal pain Bowel obstruction - sigmoid stenosis appreciated on colonoscopy in 2015 - flex sig 10/17 revealed severe sigmoid stenosis that could not be traversed - malignancy vs diverticulitis  - NGT removed 10/18 - having bowel function - flatus and stool - continue clear-full liquid, low-fiber diet for 2 weeks   PMH Crohn's disease PMH Diverticulosis   FEN: full liquid diet ID: cipro/flagyl 10/16 >> VTE: lovenox, SCD's  Plan:  stable for discharge from a a surgical standpoint. Control pain and symptoms by maintaining thin-full liquid low residue diet,  allow inflammation to decrease over the next couple of weeks prior to repeat flex six/imaging and outpatient follow up with general surgery to discuss elective resection.    LOS: 5 days    Adam Phenix , [facility] 11/17/2015, 11:09 AM Pager: [phone] Consults: [phone] Mon-Fri 7:00 am-4:30 pm Sat-Sun 7:00 am-11:30 am

## 2015-11-17 NOTE — Discharge Summary (Signed)
[facility]  Physician Discharge Summary   Patient ID: Elizabeth Andrade MRN: [id number] DOB/AGE: 55-Dec-1962 55 y.o.  Admit date: 11/12/2015 Discharge date: 11/17/2015  PCP: Mickie Hillier, MD  DISCHARGE DIAGNOSES:  Principal Problem:   Ileus [facility]) Active Problems:   Type 2 diabetes mellitus without complication, without long-term current use of insulin (HCC)   Crohn's colitis, without complications (HCC)   Vomiting   Colitis   Colonic mass   Abnormal CT scan, gastrointestinal tract   Colonic stricture   RECOMMENDATIONS FOR OUTPATIENT FOLLOW UP: 1. Patient did remain only on a liquid diet for now 2. Needs further outpatient workup as discussed below  DISCHARGE CONDITION: fair  Diet recommendation: Liquid diet  Filed Weights   11/12/15 1704  Weight: 75.8 kg (167 lb)    INITIAL HISTORY: 55 y.o.femalewith a past medical history significant for Crohn's colitis not currently on disease-specific therapywho presents with 1 week abominal discomfort, nausea and vomiting. CT scan of the abdomen raised concern for possible colonic mass, ileus, and colitis. Patient was hospitalized and seen by gastroenterology.   Consultants: Gastroenterology and general surgery  Procedures: Flexible sigmoidoscopy 10/17   HOSPITAL COURSE:   Intractable nausea, vomiting and abdominal distentionlikely partial bowel obstruction Patient has history of bowel stricture related with Crohn's disease. CT scan of abdomen consistent with possible colon mass, ileus and colitis. Gastroenterology was consulted. Patient underwent flexible sigmoidoscopy 10/17 which showed sigmoid colon stenosis. The scope could not be advanced beyond the site. Seen by general surgery. Patient in the meantime has improved. She's had bowel movements. She is feeling much better. Advanced to full liquids by general surgery. Changed to oral antibiotics. She has been tolerating a liquid diet and oral antibiotics without any  difficulty. She is having 2-3 bowel movements daily. Discussed with general surgery as well as gastroenterology today. Okay for discharge home. Outpatient follow-up to be arranged by them. She will likely need to have repeat flexible sigmoidoscopy and may need to have part of her colon resected.  Colitis in a patient with history of Crohn's disease CT scan showed findings suggestive of chronic diverticulitis along with colitis. Patient was started on IV Cipro and Flagyl which will be changed over to oral . . She seems to be doing well.  Suspected colonic mass Apparently no mass was noted on colonoscopy done just a few years ago. This will need further outpatient workup.  Type 2 diabetes mellitus without complication, without long-term current use of insulin HbA1c 7.2. May resume home medications. She is noted to be on metformin.  An 8 mm right lung base nodule similar to CT dated 02/07/2009 Will need outpatient surveillance.  Positive urine culture with Escherichia coli Sensitive to Cipro. Unclear if she was ever truly symptomatic.  Overall improved. Inability in the hallway. Okay for discharge home today.  PERTINENT LABS:  The results of significant diagnostics from this hospitalization (including imaging, microbiology, ancillary and laboratory) are listed below for reference.    Microbiology: Recent Results (from the past 240 hour(s))  Urine culture     Status: Abnormal   Collection Time: 11/12/15  7:34 PM  Result Value Ref Range Status   Specimen Description URINE, CLEAN CATCH  Final   Special Requests NONE  Final   Culture >=100,000 COLONIES/mL ESCHERICHIA COLI (A)  Final   Report Status 11/15/2015 FINAL  Final   Organism ID, Bacteria ESCHERICHIA COLI (A)  Final      Susceptibility   Escherichia coli - MIC*  AMPICILLIN 8 SENSITIVE Sensitive     CEFAZOLIN <=4 SENSITIVE Sensitive     CEFTRIAXONE <=1 SENSITIVE Sensitive     CIPROFLOXACIN <=0.25 SENSITIVE Sensitive      GENTAMICIN <=1 SENSITIVE Sensitive     IMIPENEM <=0.25 SENSITIVE Sensitive     NITROFURANTOIN <=16 SENSITIVE Sensitive     TRIMETH/SULFA <=20 SENSITIVE Sensitive     AMPICILLIN/SULBACTAM 4 SENSITIVE Sensitive     PIP/TAZO <=4 SENSITIVE Sensitive     Extended ESBL NEGATIVE Sensitive     * >=100,000 COLONIES/mL ESCHERICHIA COLI     Labs: Basic Metabolic Panel:  Recent Labs Lab 11/12/15 1715 11/13/15 0415 11/14/15 1021 11/16/15 0559  NA 139 137 134* 140  K 3.7 4.2 3.6 4.1  CL 103 102 102 107  CO2 24 26 24 27   GLUCOSE 123* 145* 116* 108*  BUN 18 16 12  <5*  CREATININE 0.79 0.74 0.66 0.65  CALCIUM 11.1* 10.6* 9.7 10.2   Liver Function Tests:  Recent Labs Lab 11/12/15 1715  AST 25  ALT 27  ALKPHOS 119  BILITOT 1.2  PROT 8.5*  ALBUMIN 4.5    Recent Labs Lab 11/12/15 1715  LIPASE 17   No results for input(s): AMMONIA in the last 168 hours. CBC:  Recent Labs Lab 11/12/15 1715 11/14/15 1021 11/16/15 0559  WBC 14.1* 11.2* 7.8  HGB 15.8* 13.0 12.8  HCT 46.1* 39.1 38.8  MCV 87.8 89.3 90.9  PLT 577* 585* 577*   CBG:  Recent Labs Lab 11/16/15 2027 11/16/15 2357 11/17/15 0348 11/17/15 0750 11/17/15 1215  GLUCAP 131* 118* 128* 115* 111*     IMAGING STUDIES Ct Abdomen Pelvis W Contrast  Result Date: 11/12/2015 CLINICAL DATA:  55 year old female with abdominal. Patient is unable to tolerate food. EXAM: CT ABDOMEN AND PELVIS WITH CONTRAST TECHNIQUE: Multidetector CT imaging of the abdomen and pelvis was performed using the standard protocol following bolus administration of intravenous contrast. CONTRAST:  30mL ISOVUE-300 IOPAMIDOL (ISOVUE-300) INJECTION 61%, ISOVUE-300 IOPAMIDOL (ISOVUE-300) INJECTION 61% COMPARISON:  Abdominal radiograph dated 01/11 and dated 02/07/2009 FINDINGS: Lower chest: There is an 8 mm nodule at the right lung base (series 2, image 15) which similar the study dated 02/07/2009. The visualized lung bases is otherwise No  intra-abdominal free air. Small pelvic ascites. Small amount of perihepatic free fluid is also noted. Hepatobiliary: Cholecystectomy. The liver appears unremarkable. No intrahepatic biliary ductal dilatation. Pancreas: Unremarkable. No pancreatic ductal dilatation or surrounding inflammatory changes. Spleen: Normal in size without focal abnormality. Adrenals/Urinary Tract: Adrenal glands are unremarkable. Kidneys are normal, without renal calculi, focal lesion, or hydronephrosis. Bladder is unremarkable. Stomach/Bowel: There is segmental thickening and irregularity of the sigmoid colon. There is a 3.2 x 4.0 cm soft tissue mass involving the sigmoid colon with associated narrowing of the lumen of the colon. Although thickened segment of the sigmoid colon may partly related to muscular hypertrophy related to recurrent diverticulitis, this masslike density is highly concerning for neoplasm. Further evaluation with colonoscopy recommended. No significant active inflammatory changes identified. Minimal perisigmoid haziness noted diffusely colonic fold represent mild diverticulitis. The colon is filled with liquid content compatible with diarrheal state. There is mild diffuse thickening of the colonic wall which may represent a degree of colitis. There is a small hiatal hernia. Multiple nondistended loops of small bowel with air-fluid level likely representing a degree of ileus. There is apparent segment of thickened loop of distal ileum in the left hemi abdomen (series 2, image 60) which may represent layering high attenuating  content versus less likely segmental thickening of the bowel wall. There is no definite evidence of bowel obstruction. Normal appendix. Vascular/Lymphatic: There is mild aortoiliac atherosclerotic disease. The origins of the celiac axis, SMA, IMA as well as the origins of the renal arteries are patent. No portal venous gas identified. There is no adenopathy. Reproductive: The uterus and ovaries are  grossly unremarkable. Other: None Musculoskeletal: Small fat containing umbilical hernia. Abdominal wall soft tissues appear unremarkable. The osseous structures are intact. IMPRESSION: Focal masslike lesion within the sigmoid colon suspicious for neoplasm. Further evaluation with colonoscopy recommended. There is segmental thickening of the colon, likely partly related to chronic diverticulitis. Diarrheal state with findings of possible mild colitis. Correlation with clinical exam and stool cultures recommended. Small bowel ileus. Layering bowel contents versus less likely segmental thickening of the distal small bowel in the left hemipelvis. No evidence of bowel obstruction. Normal appendix. An 8 mm right lung base nodule similar to CT dated 02/07/2009 Electronically Signed   By: Elgie Collard M.D.   On: 11/12/2015 22:24    DISCHARGE EXAMINATION: Vitals:   11/16/15 0500 11/16/15 1524 11/16/15 2025 11/17/15 0349  BP: 128/73 128/73 127/74 123/78  Pulse: (!) 58 63 (!) 54 (!) 54  Resp: 17 17 16 18   Temp: 98.1 F (36.7 C) 98.8 F (37.1 C) 98.7 F (37.1 C) 98.4 F (36.9 C)  TempSrc: Oral Oral Oral Oral  SpO2: 99% 98% 98% 98%  Weight:      Height:       General appearance: alert, cooperative, appears stated age and no distress Resp: clear to auscultation bilaterally Cardio: regular rate and rhythm, S1, S2 normal, no murmur, click, rub or gallop GI: Abdomen is soft. Mildly tender in the left side without any rebound, rigidity or guarding. No masses, organomegaly. Bowel sounds are present, Normal Extremities: extremities normal, atraumatic, no cyanosis or edema  DISPOSITION: Home with husband  Discharge Instructions    Call MD for:  difficulty breathing, headache or visual disturbances    Complete by:  As directed    Call MD for:  extreme fatigue    Complete by:  As directed    Call MD for:  persistant dizziness or light-headedness    Complete by:  As directed    Call MD for:   persistant nausea and vomiting    Complete by:  As directed    Call MD for:  severe uncontrolled pain    Complete by:  As directed    Call MD for:  temperature >100.4    Complete by:  As directed    Discharge instructions    Complete by:  As directed    Please follow-up with your gastroenterologist. Appointment has been made for you. Please take your medications as prescribed. Please remain only on a liquid diet for now.  You were cared for by a hospitalist during your hospital stay. If you have any questions about your discharge medications or the care you received while you were in the hospital after you are discharged, you can call the unit and asked to speak with the hospitalist on call if the hospitalist that took care of you is not available. Once you are discharged, your primary care physician will handle any further medical issues. Please note that NO REFILLS for any discharge medications will be authorized once you are discharged, as it is imperative that you return to your primary care physician (or establish a relationship with a primary care physician if you do  not have one) for your aftercare needs so that they can reassess your need for medications and monitor your lab values. If you do not have a primary care physician, you can call [phone] for a physician referral.   Increase activity slowly    Complete by:  As directed       ALLERGIES: No Known Allergies   Current Discharge Medication List    START taking these medications   Details  ciprofloxacin (CIPRO) 500 MG tablet Take 1 tablet (500 mg total) by mouth 2 (two) times daily. Qty: 28 tablet, Refills: 0    famotidine (PEPCID) 20 MG tablet Take 1 tablet (20 mg total) by mouth daily. Qty: 30 tablet, Refills: 0    HYDROcodone-acetaminophen (NORCO/VICODIN) 5-325 MG tablet Take 1-2 tablets by mouth every 4 (four) hours as needed for moderate pain or severe pain. Qty: 30 tablet, Refills: 0    metroNIDAZOLE (FLAGYL) 500 MG  tablet Take 1 tablet (500 mg total) by mouth every 8 (eight) hours. Qty: 42 tablet, Refills: 0      CONTINUE these medications which have NOT CHANGED   Details  acetaminophen (TYLENOL) 500 MG tablet Take 1,000 mg by mouth every 6 (six) hours as needed for moderate pain. For pain     cholecalciferol (VITAMIN D) 1000 UNITS tablet Take 5,000 Units by mouth daily. Reported on 02/09/2015    ferrous sulfate 325 (65 FE) MG tablet Take 325 mg by mouth daily with breakfast. Reported on 02/09/2015    metFORMIN (GLUCOPHAGE XR) 500 MG 24 hr tablet Take 2 tablets (1,000 mg total) by mouth daily with breakfast. Qty: 60 tablet, Refills: 5   Associated Diagnoses: Type 2 diabetes mellitus without complication, without long-term current use of insulin (HCC)    Multiple Vitamin (MULTIVITAMIN) tablet Take 1 tablet by mouth daily. Reported on 02/09/2015    omega-3 acid ethyl esters (LOVAZA) 1 G capsule Take 1 g by mouth daily. Reported on 02/09/2015         Follow-up Information    Valarie MerinoMARTIN,MATTHEW B, MD .   Specialty:  General Surgery Why:  schedule an appointment with the above surgeon in 2-3 weeks. Contact information: [address] [place] [address] [phone]        Willette ClusterPaula Guenther, NP Follow up on 11/28/2015.   Specialty:  Gastroenterology Why:  2:00 pm  Contact information: [address] [place] [address] [phone]           TOTAL DISCHARGE TIME: 35 mins  [facility] EndoscopyKRISHNAN,Keysi Oelkers  [facility] Pager [phone]  11/17/2015, 2:37 PM

## 2015-11-17 NOTE — Progress Notes (Signed)
Patient is being discharged home. Discharge instructions were given to patient and family 

## 2015-11-28 ENCOUNTER — Ambulatory Visit (INDEPENDENT_AMBULATORY_CARE_PROVIDER_SITE_OTHER): Payer: BC Managed Care – PPO | Admitting: Nurse Practitioner

## 2015-11-28 ENCOUNTER — Encounter: Payer: Self-pay | Admitting: Nurse Practitioner

## 2015-11-28 VITALS — BP 118/70 | HR 76 | Ht 65.5 in | Wt 159.1 lb

## 2015-11-28 DIAGNOSIS — K56699 Other intestinal obstruction unspecified as to partial versus complete obstruction: Secondary | ICD-10-CM | POA: Diagnosis not present

## 2015-11-28 MED ORDER — NA SULFATE-K SULFATE-MG SULF 17.5-3.13-1.6 GM/177ML PO SOLN: 1.0000 | Freq: Once | ORAL | 0 refills | Status: AC

## 2015-11-28 MED ORDER — CIPROFLOXACIN HCL 500 MG PO TABS: 500.0000 mg | ORAL_TABLET | Freq: Two times a day (BID) | ORAL | 0 refills | Status: DC

## 2015-11-28 MED ORDER — METRONIDAZOLE 500 MG PO TABS: 500.0000 mg | ORAL_TABLET | Freq: Three times a day (TID) | ORAL | 0 refills | Status: DC

## 2015-11-28 NOTE — Patient Instructions (Signed)
We have sent the following medications to your pharmacy for you to pick up at your convenience:Cipro and Flagyl x 3 weeks.   You have been scheduled for a colonoscopy. Please follow written instructions given to you at your visit today.  Please pick up your prep supplies at the pharmacy within the next 1-3 days. If you use inhalers (even only as needed), please bring them with you on the day of your procedure. Your physician has requested that you go to www.[email or web address].com and enter the access code given to you at your visit today. This web site gives a general overview about your procedure. However, you should still follow specific instructions given to you by our office regarding your preparation for the procedure.

## 2015-11-28 NOTE — Progress Notes (Signed)
HPI: This is a 55 year old female formerly followed by Dr. Arlyce DiceKaplan. She most recently became under the care of Dr. Myrtie Neitheranis.   Patient seen by Dr. Arlyce DiceKaplan in 2011 with lower abdominal pain and constipation. Colonoscopy January 2011 revealed segmental sigmoid colitis with distal sigmoid stricture.  Biopsy compatible with chronic, quiescent colitis . She was treated with steroids and Lialda. In May 2015 she had a follow up colonoscopy with findings of a moderate stricture 20 cm from the anus. The 12 mm scope passed without resistance. There were scattered diverticula in the more proximal sigmoid colon. Patient discontinued her IBD medications slightly more than a year ago because they discolored her urine black. Did well until earlier this month when she developed generalized abdominal pain, bloating and difficulty moving her bowels. Patient was seen at that time by Dr. Myrtie Neitheranis ago she probably had a diverticulitis flare rather than Crohn's colitis. Patient was given a prescription for Cipro and Flagyl, placed on a liquid diet. She presented to the emergency department 2 days later for progressive pain. CT scan showed masslike lesion in the sigmoid colon with segmental thickening of the colon. Patient flexible sigmoidoscopy revealed a stricture in the distal sigmoid colon. Stenosis could not be safely traversed. No active Crohn's was appreciated. Seen by surgery and consultation. Felt to have a diverticular stricture with partial obstruction. Recommended liquid diet, no immediate surgery recommended.  Patient is here for a hospital follow-up. She is feeling much better on antibiotics. No abdominal pain. She is mainly on liquid diet, has eaten some solid foods. Stools are still loose.  Past Medical History:  Diagnosis Date  . ABNORMAL PAP SMEAR, LGSIL 02/11/2007   Qualifier: Diagnosis of  By: Sandi MealyAlm  MD, Judeth CornfieldStephanie    . Anemia   . Colitis   . Crohn's disease (HCC)   . Diabetes mellitus without complication  (HCC)   . Diabetes mellitus, new onset (HCC) 02/28/2014  . Oropharyngeal candidiasis 02/28/2014    Patient's surgical history, family medical history, social history, medications nd allergies were all reviewed in Epic    Physical Exam: BP 118/70 (BP Location: Left Arm, Patient Position: Sitting, Cuff Size: Normal)   Pulse 76   Ht 5' 5.5" (1.664 m)   Wt 159 lb 2 oz (72.2 kg)   BMI 26.08 kg/m   GENERAL: Pleasant, well-developed black female in no acute distress PSYCH: :Pleasant, cooperative, normal affect HEENT: Normocephalic, conjunctiva pink, mucous membranes moist, neck supple without masses CARDIAC:  RRR, no murmur heard, no peripheral edema PULM: Normal respiratory effort, lungs CTA bilaterally, no wheezing ABDOMEN:  soft, nontender, nondistended, no obvious masses, no hepatomegaly,  normal bowel sounds SKIN:  turgor, no lesions seen Musculoskeletal:  Normal muscle tone, normal strength NEURO: Alert and oriented x 3, no focal neurologic deficits  ASSESSMENT and PLAN:  55 year old female with a severe, partially obstructing sigmoid stricture. Patient carries a diagnosis of Crohn's colitis (Dr. Arlyce DiceKaplan, 2011), off IBD meds for over a year. Presently, her stricture seems most likely from diverticular disease. Surgery evaluated her during recent admission and also felt this to be a diverticular stricture. Patient had a complete colonoscopy May 2015, (no evidence for active Crohn's) but ideally needs another prior to a colon resection which she is likely going to need -Patient will be scheduled for a colonoscopy, will likely need pediatric colonoscope. Will schedule procedure to be done in approximately 3 weeks. The risks and benefits of the procedure were discussed and the patient agrees  to proceed.  -continue cipro / flagyl prescribed at hospital discharge (for total of 3 weeks).

## 2015-11-29 NOTE — Progress Notes (Signed)
Thank you for sending this case to me. I have reviewed the entire note, and the outlined plan seems appropriate.  Thank you for having me see the patient in clinic with you yesterday.

## 2015-12-12 ENCOUNTER — Encounter: Payer: Self-pay | Admitting: Gastroenterology

## 2015-12-26 ENCOUNTER — Ambulatory Visit (AMBULATORY_SURGERY_CENTER): Payer: BC Managed Care – PPO | Admitting: Gastroenterology

## 2015-12-26 ENCOUNTER — Encounter: Payer: Self-pay | Admitting: Gastroenterology

## 2015-12-26 ENCOUNTER — Telehealth: Payer: Self-pay | Admitting: Gastroenterology

## 2015-12-26 VITALS — BP 118/70 | HR 90 | Temp 98.9°F | Resp 18 | Ht 65.5 in | Wt 159.0 lb

## 2015-12-26 DIAGNOSIS — K56699 Other intestinal obstruction unspecified as to partial versus complete obstruction: Secondary | ICD-10-CM | POA: Diagnosis present

## 2015-12-26 DIAGNOSIS — K633 Ulcer of intestine: Secondary | ICD-10-CM

## 2015-12-26 LAB — GLUCOSE, CAPILLARY
GLUCOSE-CAPILLARY: 96 mg/dL (ref 65–99)
Glucose-Capillary: 109 mg/dL — ABNORMAL HIGH (ref 65–99)

## 2015-12-26 MED ORDER — SODIUM CHLORIDE 0.9 % IV SOLN: 500.0000 mL | INTRAVENOUS | Status: AC

## 2015-12-26 NOTE — Op Note (Signed)
[facility] Patient Name: Elizabeth Andrade Procedure Date: 12/26/2015 1:50 PM MRN: [id number] Endoscopist: Sherilyn Cooter L. Myrtie Neither , MD Age: 55 Referring MD:  Date of Birth: [birth year] Gender: Female Account #: [id number] Procedure:                Colonoscopy Indications:              Colonic stricture Medicines:                Monitored Anesthesia Care Procedure:                Pre-Anesthesia Assessment:                           - Prior to the procedure, a History and Physical                            was performed, and patient medications and                            allergies were reviewed. The patient's tolerance of                            previous anesthesia was also reviewed. The risks                            and benefits of the procedure and the sedation                            options and risks were discussed with the patient.                            All questions were answered, and informed consent                            was obtained. Prior Anticoagulants: The patient has                            taken no previous anticoagulant or antiplatelet                            agents. ASA Grade Assessment: II - A patient with                            mild systemic disease. After reviewing the risks                            and benefits, the patient was deemed in                            satisfactory condition to undergo the procedure.                           After obtaining informed consent, the colonoscope  was passed under direct vision. Throughout the                            procedure, the patient's blood pressure, pulse, and                            oxygen saturations were monitored continuously. The                            Model PCF-H190L [id number]) scope was introduced                            through the anus and advanced to the the distal                            transverse colon. The colonoscopy was  performed                            with moderate difficulty due to bowel stenosis.                            Successful passage through the stenosis was aided                            by withdrawing the pediatric colonoscope and                            replacing with the adult endoscope, which was able                            to pass with resistance. The patient tolerated the                            procedure well. The quality of the bowel                            preparation was good. The rectum was photographed.                            The bowel preparation used was SUPREP. Scope In: 2:06:37 PM Scope Out: 2:18:39 PM Total Procedure Duration: 0 hours 12 minutes 2 seconds  Findings:                 The perianal and digital rectal examinations were                            normal.                           Many small and large-mouthed diverticula were found                            in the left colon.  A benign-appearing, intrinsic severe stenosis                            measuring 3 cm (in length) was found in the                            recto-sigmoid colon and was traversed after scope                            change. There was a single ulcer on the proximal                            extent of the stenosis.                           The exam was otherwise without abnormality on                            direct and retroflexion views. Complications:            No immediate complications. Estimated Blood Loss:     Estimated blood loss: none. Impression:               Incomplete exam (see above, EGD scope had to be                            used but could not reach the cecum)                           - Diverticulosis in the left colon.                           - Stricture in the recto-sigmoid colon. No obvious                            evidence of Crohn's disease, a diagnosis which is                            uncertain in this  patient. This stenosis appears                            due to recurrent sigmoid diverticulitis.                           - The (limited) examination was otherwise normal on                            direct and retroflexion views.                           - No specimens collected. Recommendation:           - Patient has a contact number available for                            emergencies.  The signs and symptoms of potential                            delayed complications were discussed with the                            patient. Return to normal activities tomorrow.                            Written discharge instructions were provided to the                            patient.                           - Resume previous diet.                           - Continue present medications.                           - Repeat colonoscopy in 8 years for screening                            purposes (last complete colonoscopy in 2015).                           - Refer to a surgeon at the next available                            appointment to plan elective sigmoid resection. Kyjuan Gause L. Myrtie Neither, MD 12/26/2015 2:31:33 PM This report has been signed electronically.

## 2015-12-26 NOTE — Progress Notes (Signed)
To recovery, report to Hodges, RN, VSS 

## 2015-12-26 NOTE — Telephone Encounter (Signed)
Please send a referral regarding this patient to Dr Luretha MurphyMatthew Martin at [facility] for sigmoid colon stricture requiring resection. He recently saw the patient in hospital consult.

## 2015-12-26 NOTE — Progress Notes (Signed)
Pt c/o right wrist pain immediately with IV start.  IV immediately removed and pt able to wiggle fingers without difficulty

## 2015-12-26 NOTE — Patient Instructions (Addendum)
YOU HAD AN ENDOSCOPIC PROCEDURE TODAY AT THE [facility]:   Refer to the procedure report that was given to you for any specific questions about what was found during the examination.  If the procedure report does not answer your questions, please call your gastroenterologist to clarify.  If you requested that your care partner not be given the details of your procedure findings, then the procedure report has been included in a sealed envelope for you to review at your convenience later.  YOU SHOULD EXPECT: Some feelings of bloating in the abdomen. Passage of more gas than usual.  Walking can help get rid of the air that was put into your GI tract during the procedure and reduce the bloating. If you had a lower endoscopy (such as a colonoscopy or flexible sigmoidoscopy) you may notice spotting of blood in your stool or on the toilet paper. If you underwent a bowel prep for your procedure, you may not have a normal bowel movement for a few days.  Please Note:  You might notice some irritation and congestion in your nose or some drainage.  This is from the oxygen used during your procedure.  There is no need for concern and it should clear up in a day or so.  SYMPTOMS TO REPORT IMMEDIATELY:   Following lower endoscopy (colonoscopy or flexible sigmoidoscopy):  Excessive amounts of blood in the stool  Significant tenderness or worsening of abdominal pains  Swelling of the abdomen that is new, acute  Fever of 100F or higher   For urgent or emergent issues, a gastroenterologist can be reached at any hour by calling [phone].   DIET:  We do recommend a small meal at first, but then you may proceed to your regular diet.  Drink plenty of fluids but you should avoid alcoholic beverages for 24 hours.  ACTIVITY:  You should plan to take it easy for the rest of today and you should NOT DRIVE or use heavy machinery until tomorrow (because of the sedation medicines used during the test).     FOLLOW UP: Our staff will call the number listed on your records the next business day following your procedure to check on you and address any questions or concerns that you may have regarding the information given to you following your procedure. If we do not reach you, we will leave a message.  However, if you are feeling well and you are not experiencing any problems, there is no need to return our call.  We will assume that you have returned to your regular daily activities without incident.  If any biopsies were taken you will be contacted by phone or by letter within the next 1-3 weeks.  Please call us at [phone] if you have not heard about the biopsies in 3 weeks.    SIGNATURES/CONFIDENTIALITY: You and/or your care partner have signed paperwork which will be entered into your electronic medical record.  These signatures attest to the fact that that the information above on your After Visit Summary has been reviewed and is understood.  Full responsibility of the confidentiality of this discharge information lies with you and/or your care-partner.  The office will refer you to a surgeon for your issues.  They will call you.  Be sure to take a colace every day in order to pass stools if possible.  Do call us if symptoms worsen before your surgery.  Read all of the handouts given to you by your recovery room  nurse.   Hoyt Koch for choosing [place] for your healthcare needs today.

## 2015-12-27 ENCOUNTER — Telehealth: Payer: Self-pay | Admitting: *Deleted

## 2015-12-27 NOTE — Telephone Encounter (Signed)
No anwser. No identifier. Message left to call if questions or concerns.

## 2015-12-27 NOTE — Telephone Encounter (Signed)
Records faxed. Will await appointment info.

## 2015-12-28 NOTE — Telephone Encounter (Signed)
Pt has an appointment at [facility] with Dr Daphine Deutscher on 02-02-2015 at 11:15am with a 10:45am arrival time. Pt has been notified and aware by [facility].

## 2015-12-29 ENCOUNTER — Ambulatory Visit: Payer: BC Managed Care – PPO | Admitting: Gastroenterology

## 2016-01-16 ENCOUNTER — Telehealth: Payer: Self-pay | Admitting: Internal Medicine

## 2016-01-16 ENCOUNTER — Other Ambulatory Visit: Payer: Self-pay | Admitting: Nurse Practitioner

## 2016-01-16 ENCOUNTER — Other Ambulatory Visit: Payer: Self-pay

## 2016-01-16 MED ORDER — CIPROFLOXACIN HCL 500 MG PO TABS: 500.0000 mg | ORAL_TABLET | Freq: Two times a day (BID) | ORAL | 0 refills | Status: DC

## 2016-01-16 MED ORDER — DOCUSATE SODIUM 100 MG PO CAPS: 100.0000 mg | ORAL_CAPSULE | Freq: Two times a day (BID) | ORAL | 1 refills | Status: DC

## 2016-01-16 MED ORDER — METRONIDAZOLE 500 MG PO TABS: 500.0000 mg | ORAL_TABLET | Freq: Two times a day (BID) | ORAL | 0 refills | Status: DC

## 2016-01-16 NOTE — Telephone Encounter (Signed)
Spoke to patient, let her know to stay on the colace until she is to have surgery. Rx's have been sent to her pharmacy. Also discussed low residue diet, let her know that I have mailed a pamphlet to her with recommended foods. Instructed to call if she has questions or concerns.

## 2016-01-16 NOTE — Telephone Encounter (Signed)
Recommend a low reside diet until her surgery. Colace 100 mg po bid until her surgery.  Cipro 500 mg po bid and Flagyl 500 mg po bid, both for 10 days.

## 2016-01-16 NOTE — Telephone Encounter (Signed)
Patient requesting a refill of antibiotics. Spoke to patient, she has been off of the cipro and flagyl for approximately 1 week. She states that her symptoms have returned, having abdominal pain, feels like she needs to have bm, did have small amount of results. Vomited x 2 this morning, no blood. Patient does state that she has been eating just plant based foods, did have chili with beans yesterday and greens. While on the antibiotics she did not have any of these symptoms. 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 Please advise.

## 2016-01-23 ENCOUNTER — Emergency Department (HOSPITAL_COMMUNITY)
Admission: EM | Admit: 2016-01-23 | Discharge: 2016-01-23 | Disposition: A | Discharge status: Home / Self Care | Payer: BC Managed Care – PPO | Attending: Emergency Medicine | Admitting: Emergency Medicine

## 2016-01-23 ENCOUNTER — Encounter (HOSPITAL_COMMUNITY): Payer: Self-pay

## 2016-01-23 DIAGNOSIS — E1140 Type 2 diabetes mellitus with diabetic neuropathy, unspecified: Secondary | ICD-10-CM | POA: Insufficient documentation

## 2016-01-23 DIAGNOSIS — Z79899 Other long term (current) drug therapy: Secondary | ICD-10-CM | POA: Insufficient documentation

## 2016-01-23 DIAGNOSIS — F17210 Nicotine dependence, cigarettes, uncomplicated: Secondary | ICD-10-CM | POA: Insufficient documentation

## 2016-01-23 DIAGNOSIS — G629 Polyneuropathy, unspecified: Secondary | ICD-10-CM

## 2016-01-23 DIAGNOSIS — M25531 Pain in right wrist: Secondary | ICD-10-CM | POA: Diagnosis present

## 2016-01-23 DIAGNOSIS — Z7984 Long term (current) use of oral hypoglycemic drugs: Secondary | ICD-10-CM | POA: Diagnosis not present

## 2016-01-23 MED ORDER — GABAPENTIN 100 MG PO CAPS: 100.0000 mg | ORAL_CAPSULE | Freq: Two times a day (BID) | ORAL | 0 refills | Status: DC | PRN

## 2016-01-23 NOTE — ED Provider Notes (Signed)
[facility]-EMERGENCY DEPT Provider Note   CSN: [id number] Arrival date & time: 01/23/16  1051  By signing my name below, I, Modena JanskyAlbert Thayil, attest that this documentation has been prepared under the direction and in the presence of non-physician practitioner, Trixie DredgeEmily Shirlene Andaya, PA-C. Electronically Signed: Modena JanskyAlbert Thayil, Scribe. 01/23/2016. 12:08 PM.  History   Chief Complaint Chief Complaint  Patient presents with  . Wrist Pain   The history is provided by the patient. No language interpreter was used.   HPI Comments: Elizabeth Andrade is a 55 y.o. female who presents to the Emergency Department complaining of constant moderate right wrist pain that started about 4 weeks ago. She states has been having pain since having an IV placed for a colonoscopy. Her pain radiates down to her right thumb. She describes the pain as a burning sensation in one specific location over her dorsal thumb and tingling/numbness over side of thumb closest to index finger.  She is right-handed. She denies any treatment PTA, fever, redness, or discharge at site of pain.  Denies weakness.      PCP: Mickie HillierIan McKeag, MD  Past Medical History:  Diagnosis Date  . ABNORMAL PAP SMEAR, LGSIL 02/11/2007   Qualifier: Diagnosis of  By: Sandi MealyAlm  MD, Judeth CornfieldStephanie    . Allergy   . Anemia   . Colitis   . Crohn's disease (HCC)   . Diabetes mellitus without complication (HCC)   . Diabetes mellitus, new onset (HCC) 02/28/2014  . Oropharyngeal candidiasis 02/28/2014    Patient Active Problem List   Diagnosis Date Noted  . Colonic stricture   . Colitis 11/13/2015  . Colonic mass   . Abnormal CT scan, gastrointestinal tract   . Vomiting 11/12/2015  . Ileus (HCC) 11/12/2015  . Crohn's colitis, without complications (HCC) 11/10/2015  . Healthcare maintenance 03/29/2014  . Candida esophagitis (HCC) 03/01/2014  . Type 2 diabetes mellitus without complication, without long-term current use of insulin (HCC) 02/28/2014  . Depression with anxiety  02/28/2014  . History of alcohol abuse 02/28/2014  . Tobacco abuse 02/28/2014    Past Surgical History:  Procedure Laterality Date  . CHOLECYSTECTOMY    . COLONOSCOPY    . FLEXIBLE SIGMOIDOSCOPY N/A 11/14/2015   Procedure: FLEXIBLE SIGMOIDOSCOPY;  Surgeon: Ruffin FrederickSteven Paul Armbruster, MD;  [facility];  Service: Gastroenterology;  Laterality: N/A;    OB History    No data available       Home Medications    Prior to Admission medications   Medication Sig Start Date End Date Taking? Authorizing Provider  acetaminophen (TYLENOL) 500 MG tablet Take 1,000 mg by mouth every 6 (six) hours as needed for moderate pain. For pain     Historical Provider, MD  cholecalciferol (VITAMIN D) 1000 UNITS tablet Take 5,000 Units by mouth daily. Reported on 02/09/2015    Historical Provider, MD  ciprofloxacin (CIPRO) 500 MG tablet Take 1 tablet (500 mg total) by mouth 2 (two) times daily. 01/16/16   Meryl DareMalcolm T Stark, MD  docusate sodium (COLACE) 100 MG capsule Take 1 capsule (100 mg total) by mouth 2 (two) times daily. Take twice daily until surgery 01/16/16   Meryl DareMalcolm T Stark, MD  famotidine (PEPCID) 20 MG tablet Take 1 tablet (20 mg total) by mouth daily. 11/18/15   Osvaldo ShipperGokul Krishnan, MD  ferrous sulfate 325 (65 FE) MG tablet Take 325 mg by mouth daily with breakfast. Reported on 02/09/2015    Historical Provider, MD  gabapentin (NEURONTIN) 100 MG capsule Take 1 capsule (100  mg total) by mouth 2 (two) times daily as needed. 01/23/16   Trixie Dredge, PA-C  metFORMIN (GLUCOPHAGE XR) 500 MG 24 hr tablet Take 2 tablets (1,000 mg total) by mouth daily with breakfast. 04/26/15   Kathee Delton, MD  metroNIDAZOLE (FLAGYL) 500 MG tablet Take 1 tablet (500 mg total) by mouth 2 (two) times daily. 01/16/16   Meryl Dare, MD  Multiple Vitamin (MULTIVITAMIN) tablet Take 1 tablet by mouth daily. Reported on 02/09/2015    Historical Provider, MD    Family History Family History  Problem Relation Age of Onset  .  Colon cancer Maternal Grandmother   . Breast cancer      aunt  . Depression    . Depression Brother     committed suicide in 59  . Diabetes Mother   . Hyperlipidemia Mother   . Hypertension Mother   . Diabetes Father   . Hyperlipidemia Father   . Hypertension Father   . Arthritis Maternal Aunt   . Esophageal cancer Neg Hx   . Rectal cancer Neg Hx   . Stomach cancer Neg Hx     Social History Social History  Substance Use Topics  . Smoking status: Current Every Day Smoker    Packs/day: 0.50    Types: Cigarettes  . Smokeless tobacco: Never Used  . Alcohol use No     Allergies   Patient has no known allergies.   Review of Systems Review of Systems  Constitutional: Negative for activity change, chills and fever.  Musculoskeletal: Negative for arthralgias and myalgias.  Skin: Negative for color change and rash.  Allergic/Immunologic: Negative for immunocompromised state.  Neurological: Positive for numbness. Negative for weakness.  Hematological: Does not bruise/bleed easily.  Psychiatric/Behavioral: Negative for self-injury.     Physical Exam Updated Vital Signs BP 114/65   Pulse 77   Temp 97.6 F (36.4 C) (Oral)   Resp 16   SpO2 97%   Physical Exam  Constitutional: She appears well-developed and well-nourished. No distress.  HENT:  Head: Normocephalic and atraumatic.  Neck: Neck supple.  Pulmonary/Chest: Effort normal.  Musculoskeletal:  Full active range of motion of all digits, strength 5/5, sensation intact, capillary refill < 2 seconds. No erythema, edema, warmth, discharge.  Hypersensitivity/tenderness overlying injection site over radial wrist and some area over dorsal thumb.  Decreased sensation over ulnar aspect thumb.  Tenderness does not follow prominent vasculature, no cords.    Neurological: She is alert.  Skin: She is not diaphoretic.  Nursing note and vitals reviewed.    ED Treatments / Results  DIAGNOSTIC STUDIES: Oxygen Saturation  is 97% on RA, normal by my interpretation.    COORDINATION OF CARE: 12:12 PM- Pt advised of plan for treatment and pt agrees.  Labs (all labs ordered are listed, but only abnormal results are displayed) Labs Reviewed - No data to display  EKG  EKG Interpretation None       Radiology No results found.  Procedures Procedures (including critical care time)  Medications Ordered in ED Medications - No data to display   Initial Impression / Assessment and Plan / ED Course  I have reviewed the triage vital signs and the nursing notes.  Pertinent labs & imaging results that were available during my care of the patient were reviewed by me and considered in my medical decision making (see chart for details).  Clinical Course     Afebrile, nontoxic patient with possible nerve damage from IV placement for colonoscopy last  month.  No e/o thrombophlebitis, cellulitis.  No weakness on exam. Full AROM.  Discussed pt with Dr Ranae Palms.  D/C home with orthopedic follow up (no hand surgeon on call today), gabapentin, velcro thumb spica.   Discussed result, findings, treatment, and follow up  with patient.  Pt given return precautions.  Pt verbalizes understanding and agrees with plan.       Final Clinical Impressions(s) / ED Diagnoses   Final diagnoses:  Neuropathy [facility])    New Prescriptions Discharge Medication List as of 01/23/2016 12:19 PM    START taking these medications   Details  gabapentin (NEURONTIN) 100 MG capsule Take 1 capsule (100 mg total) by mouth 2 (two) times daily as needed., Starting Tue 01/23/2016, Print       I personally performed the services described in this documentation, which was scribed in my presence. The recorded information has been reviewed and is accurate.     Trixie Dredge, PA-C 01/23/16 1242    Loren Racer, MD 01/24/16 [phone]

## 2016-01-23 NOTE — Discharge Instructions (Signed)
Read the information below.  Use the prescribed medication as directed.  Please discuss all new medications with your pharmacist.  You may return to the Emergency Department at any time for worsening condition or any new symptoms that concern you.   If you develop uncontrolled pain, weakness or numbness of the extremity, severe discoloration of the skin, or you are unable to move your thumb, return to the ER for a recheck.

## 2016-01-23 NOTE — ED Triage Notes (Signed)
Pt had IV site on 11/28 for colonoscopy.  Pt now with numbness to rt thumb not improving.  Pain in lower arm/wrist.

## 2016-02-19 ENCOUNTER — Other Ambulatory Visit: Payer: Self-pay | Admitting: General Surgery

## 2016-02-19 NOTE — H&P (Signed)
History of Present Illness Romie Levee MD; 02/19/2016 9:45 AM) The patient is a 56 year old female who presents with colonic obstruction. 56 year old female who presents to the office for evaluation of a colonic stricture. She was noted to have this in October when she presented to the hospital with obstructive symptoms. CT scan showed a mass in the left pelvis concerning for possible malignancy. A colonoscopy was performed by Dr.Danis. This did not show any signs of malignancy and was felt to be a benign stricture. She continues to have difficulty with her bowel movements. She denies any rectal bleeding.   Problem List/Past Medical Romie Levee, MD; 02/19/2016 9:45 AM) SIGMOID STRICTURE [phone])  Past Surgical History Romie Levee, MD; 02/19/2016 9:45 AM) No pertinent past surgical history  Diagnostic Studies History Romie Levee, MD; 02/19/2016 9:45 AM) Colonoscopy within last year Mammogram 1-3 years ago Pap Smear 1-5 years ago  Allergies Timmothy Euler, CMA; 02/19/2016 9:26 AM) No Known Drug Allergies 02/02/2016  Medication History Romie Levee, MD; 02/19/2016 9:45 AM) MetFORMIN HCl ER (500MG  Tablet ER 24HR, Oral daily) Active. Pepcid (20MG  Tablet, Oral daily) Active. Multivitamin Adult (Oral daily) Active. Ferrous Sulfate (325 (65 Fe)MG Tablet, Oral) Active. Cholecalciferol (5000UNIT Tablet, Oral) Active. Tylenol Extra Strength (500MG  Tablet, 2 Oral every six hours) Active. Medications Reconciled Neomycin Sulfate (500MG  Tablet, 2 (two) Tablet Oral SEE NOTE, Taken starting 02/19/2016) Active. (TAKE TWO TABLETS AT 2 PM, 3 PM, AND 10 PM THE DAY PRIOR TO SURGERY) Flagyl (500MG  Tablet, 2 (two) Tablet Oral SEE NOTE, Taken starting 02/19/2016) Active. (Take at 2pm, 3pm, and 10pm the day prior to your colon operation)  Social History Romie Levee, MD; 02/19/2016 9:45 AM) Alcohol use Occasional alcohol use. Caffeine use Carbonated beverages, Coffee,  Tea. Tobacco use Current every day smoker.  Family History Romie Levee, MD; 02/19/2016 9:45 AM) Alcohol Abuse Father. Cancer Father. Depression Mother. Diabetes Mellitus Father.  Pregnancy / Birth History Romie Levee, MD; 02/19/2016 9:45 AM) Age at menarche 12 years. Gravida 5 Length (months) of breastfeeding 3-6 Maternal age 15-20 Para 5  Other Problems Romie Levee, MD; 02/19/2016 9:45 AM) Cholelithiasis Crohn's Disease Diabetes Mellitus Diverticulosis     Review of Systems Romie Levee MD; 02/19/2016 9:46 AM) General Not Present- Appetite Loss, Chills, Fatigue, Fever, Night Sweats, Weight Gain and Weight Loss. Skin Not Present- Change in Wart/Mole, Dryness, Hives, Jaundice, New Lesions, Non-Healing Wounds, Rash and Ulcer. HEENT Present- Wears glasses/contact lenses. Not Present- Earache, Hearing Loss, Hoarseness, Nose Bleed, Oral Ulcers, Ringing in the Ears, Seasonal Allergies, Sinus Pain, Sore Throat, Visual Disturbances and Yellow Eyes. Respiratory Not Present- Bloody sputum, Chronic Cough, Difficulty Breathing, Snoring and Wheezing. Breast Not Present- Breast Mass, Breast Pain, Nipple Discharge and Skin Changes. Cardiovascular Not Present- Chest Pain, Difficulty Breathing Lying Down, Leg Cramps, Palpitations, Rapid Heart Rate, Shortness of Breath and Swelling of Extremities. Gastrointestinal Not Present- Abdominal Pain, Bloating, Bloody Stool, Change in Bowel Habits, Chronic diarrhea, Constipation, Difficulty Swallowing, Excessive gas, Gets full quickly at meals, Hemorrhoids, Indigestion, Nausea, Rectal Pain and Vomiting. Female Genitourinary Not Present- Frequency, Nocturia, Painful Urination, Pelvic Pain and Urgency. Musculoskeletal Not Present- Back Pain, Joint Pain, Joint Stiffness, Muscle Pain, Muscle Weakness and Swelling of Extremities. Endocrine Not Present- Cold Intolerance, Excessive Hunger, Hair Changes, Heat Intolerance, Hot flashes and New  Diabetes. Hematology Not Present- Blood Thinners, Easy Bruising, Excessive bleeding, Gland problems, HIV and Persistent Infections.  Vitals Barron Alvine Bradford CMA; 02/19/2016 9:27 AM) 02/19/2016 9:26 AM Weight: 149.8 lb Height: 66in Body Surface  Area: 1.77 m Body Mass Index: 24.18 kg/m  Temp.: 98.47F  Pulse: 78 (Regular)  BP: 132/88 (Sitting, Left Arm, Standard)      Physical Exam Romie Levee MD; 02/19/2016 9:46 AM)  General Mental Status-Alert. General Appearance-Not in acute distress. Build & Nutrition-Well nourished. Posture-Normal posture. Gait-Normal.  Head and Neck Head-normocephalic, atraumatic with no lesions or palpable masses. Trachea-midline.  Chest and Lung Exam Chest and lung exam reveals -on auscultation, normal breath sounds, no adventitious sounds and normal vocal resonance.  Cardiovascular Cardiovascular examination reveals -normal heart sounds, regular rate and rhythm with no murmurs and no digital clubbing, cyanosis, edema, increased warmth or tenderness.  Abdomen Inspection Inspection of the abdomen reveals - No Hernias. Palpation/Percussion Palpation and Percussion of the abdomen reveal - Soft, Non Tender, No Rigidity (guarding), No hepatosplenomegaly and No Palpable abdominal masses.  Neurologic Neurologic evaluation reveals -alert and oriented x 3 with no impairment of recent or remote memory, normal attention span and ability to concentrate, normal sensation and normal coordination.  Musculoskeletal Normal Exam - Bilateral-Upper Extremity Strength Normal and Lower Extremity Strength Normal.    Assessment & Plan Romie Levee MD; 02/19/2016 9:43 AM)  SIGMOID STRICTURE [phone]) Impression: 56 year old female with a symptomatic colonic stricture. Colonoscopy was performed in November. This was incomplete due to the stricture only being able to be traversed with a EGD scope. There was no signs of malignancy noted  internally. No biopsies were taken. I have recommended surgical excision. We have discussed the robotic technique in detail. Dr. Daphine Deutscher would like to assist with the surgery. We will get this scheduled as soon as possible. The surgery and anatomy were described to the patient as well as the risks of surgery and the possible complications. These include: Bleeding, deep abdominal infections and possible wound complications such as hernia and infection, damage to adjacent structures, leak of surgical connections, which can lead to other surgeries and possibly an ostomy, possible need for other procedures, such as abscess drains in radiology, possible prolonged hospital stay, possible diarrhea from removal of part of the colon, possible constipation from narcotics, possible bowel, bladder or sexual dysfunction if having rectal surgery, prolonged fatigue/weakness or appetite loss, possible early recurrence of of disease, possible complications of their medical problems such as heart disease or arrhythmias or lung problems, death (less than 1%). I believe the patient understands and wishes to proceed with the surgery.

## 2016-02-27 ENCOUNTER — Telehealth: Payer: Self-pay | Admitting: Family Medicine

## 2016-02-27 NOTE — Telephone Encounter (Signed)
[facility] - Client TELEPHONE ADVICE RECORD [facility] Patient Name: Elizabeth Andrade DOB: 1960-10-07 Initial Comment Caller states that she is having diarrhea and a headache. She wants to know what medication she can take. Her PCP is Dr. Gwenlyn Fudge. Nurse Assessment Nurse: Lane Hacker, RN, Elvin So Date/Time (Eastern Time): 02/27/2016 2:38:50 PM Confirm and document reason for call. If symptomatic, describe symptoms. ---Caller states that she is having diarrhea (4 - 5 times) and a mild headache. She wants to know what medication she can take? S/S started today. Family sick with it also. Does the patient have any new or worsening symptoms? ---Yes Will a triage be completed? ---Yes Related visit to physician within the last 2 weeks? ---No Does the PT have any chronic conditions? (i.e. diabetes, asthma, etc.) ---Yes List chronic conditions. ---colitis Is this a behavioral health or substance abuse call? ---No Guidelines Guideline Title Affirmed Question Affirmed Notes Diarrhea MILD-MODERATE diarrhea (e.g., 1-6 times / day more than normal) (all triage questions negative) Final Disposition User [facility] Aldie, RN, Elvin So Comments Removal of some of her colon on 04/05/16. Disagree/Comply: Comply

## 2016-03-06 DIAGNOSIS — E119 Type 2 diabetes mellitus without complications: Secondary | ICD-10-CM | POA: Diagnosis not present

## 2016-03-06 DIAGNOSIS — B349 Viral infection, unspecified: Secondary | ICD-10-CM | POA: Insufficient documentation

## 2016-03-06 DIAGNOSIS — F17210 Nicotine dependence, cigarettes, uncomplicated: Secondary | ICD-10-CM | POA: Insufficient documentation

## 2016-03-06 DIAGNOSIS — Z7984 Long term (current) use of oral hypoglycemic drugs: Secondary | ICD-10-CM | POA: Diagnosis not present

## 2016-03-06 DIAGNOSIS — Z79899 Other long term (current) drug therapy: Secondary | ICD-10-CM | POA: Insufficient documentation

## 2016-03-06 DIAGNOSIS — M546 Pain in thoracic spine: Secondary | ICD-10-CM | POA: Diagnosis present

## 2016-03-07 ENCOUNTER — Encounter (HOSPITAL_COMMUNITY): Payer: Self-pay | Admitting: Family Medicine

## 2016-03-07 ENCOUNTER — Emergency Department (HOSPITAL_COMMUNITY)
Admission: EM | Admit: 2016-03-07 | Discharge: 2016-03-07 | Disposition: A | Discharge status: Home / Self Care | Payer: BC Managed Care – PPO | Attending: Emergency Medicine | Admitting: Emergency Medicine

## 2016-03-07 DIAGNOSIS — B349 Viral infection, unspecified: Secondary | ICD-10-CM

## 2016-03-07 NOTE — ED Triage Notes (Signed)
Patient reports she having flu like symptoms before going to bed. Pt took Mucinex Flu before going to bed. Also, been exposed to the flu by her son. Pt reports she woke up about an hour ago with mid back pain that has radiated around to her anterior side. Also, reports cough and headache.

## 2016-03-07 NOTE — ED Notes (Signed)
Patient verbalizes understanding of discharge understanding, home care and follow up care. Patient out of department with family

## 2016-03-07 NOTE — Discharge Instructions (Signed)
Tylenol 1000 mg rotated with Motrin 600 mg every 4 hours as needed for pain or fever.  Drink plenty of fluids and get plenty of rest.  Return to the emergency department if your symptoms significantly worsen or change.

## 2016-03-07 NOTE — ED Provider Notes (Signed)
[facility]-EMERGENCY DEPT Provider Note   CSN: [id number] Arrival date & time: 03/06/16  2352  By signing my name below, I, Rosario Adie, attest that this documentation has been prepared under the direction and in the presence of Geoffery Lyons, MD. Electronically Signed: Rosario Adie, ED Scribe. 03/07/16. 1:52 AM.  History   Chief Complaint Chief Complaint  Patient presents with  . Back Pain   The history is provided by the patient. No language interpreter was used.  Back Pain   This is a new problem. The current episode started 1 to 2 hours ago. The problem occurs constantly. The pain is associated with no known injury. The pain is present in the thoracic spine. The quality of the pain is described as stabbing. Radiates to: around sides into anterior chest. The pain is at a severity of 0/10. The patient is experiencing no pain. Pertinent negatives include no dysuria. Treatments tried: Mucinex Cold/Flu. The treatment provided significant relief.    HPI Comments: Elizabeth Andrade is a 56 y.o. female with a h/o DM, Crohn's disease, and colitis, who presents to the Emergency Department complaining of gradual onset, resolved mid back pain beginning 1-2 hours ago. Pt reports that tonight prior to going to sleep that she was asymptomatic, however, she woke up from sleep with her back pain. It has since resolved while being in the ED; however, she notes that it did radiate and shoot around her bilateral sides and into her chest. She described her pain as stabbing at that time. Pt took Mucinex cold/flu at home with relief of her symptoms. Several of her children have been sick with influenza like symptoms recently. No h/o cardiopulmonary issues. She is a current, everyday smoker. Pt has a h/o UTIs, but notes that her symptoms feel non-similar to this. Denies urgency, frequency, hematuria, dysuria, difficulty urinating, or any other associated symptoms.   Past Medical History:  Diagnosis Date  .  ABNORMAL PAP SMEAR, LGSIL 02/11/2007   Qualifier: Diagnosis of  By: Sandi Mealy  MD, Judeth Cornfield    . Allergy   . Anemia   . Colitis   . Crohn's disease (HCC)   . Diabetes mellitus without complication (HCC)   . Diabetes mellitus, new onset (HCC) 02/28/2014  . Oropharyngeal candidiasis 02/28/2014   Patient Active Problem List   Diagnosis Date Noted  . Colonic stricture   . Colitis 11/13/2015  . Colonic mass   . Abnormal CT scan, gastrointestinal tract   . Vomiting 11/12/2015  . Ileus (HCC) 11/12/2015  . Crohn's colitis, without complications (HCC) 11/10/2015  . Healthcare maintenance 03/29/2014  . Candida esophagitis (HCC) 03/01/2014  . Type 2 diabetes mellitus without complication, without long-term current use of insulin (HCC) 02/28/2014  . Depression with anxiety 02/28/2014  . History of alcohol abuse 02/28/2014  . Tobacco abuse 02/28/2014   Past Surgical History:  Procedure Laterality Date  . CHOLECYSTECTOMY    . COLONOSCOPY    . FLEXIBLE SIGMOIDOSCOPY N/A 11/14/2015   Procedure: FLEXIBLE SIGMOIDOSCOPY;  Surgeon: Ruffin Frederick, MD;  Location: [facility];  Service: Gastroenterology;  Laterality: N/A;   OB History    No data available       Home Medications    Prior to Admission medications   Medication Sig Start Date End Date Taking? Authorizing Provider  acetaminophen (TYLENOL) 500 MG tablet Take 1,000 mg by mouth every 6 (six) hours as needed for moderate pain. For pain     Historical Provider, MD  cholecalciferol (VITAMIN D)  1000 UNITS tablet Take 5,000 Units by mouth daily. Reported on 02/09/2015    Historical Provider, MD  ciprofloxacin (CIPRO) 500 MG tablet Take 1 tablet (500 mg total) by mouth 2 (two) times daily. 01/16/16   Meryl Dare, MD  docusate sodium (COLACE) 100 MG capsule Take 1 capsule (100 mg total) by mouth 2 (two) times daily. Take twice daily until surgery 01/16/16   Meryl Dare, MD  famotidine (PEPCID) 20 MG tablet Take 1 tablet (20 mg total)  by mouth daily. 11/18/15   Osvaldo Shipper, MD  ferrous sulfate 325 (65 FE) MG tablet Take 325 mg by mouth daily with breakfast. Reported on 02/09/2015    Historical Provider, MD  gabapentin (NEURONTIN) 100 MG capsule Take 1 capsule (100 mg total) by mouth 2 (two) times daily as needed. 01/23/16   Trixie Dredge, PA-C  metFORMIN (GLUCOPHAGE XR) 500 MG 24 hr tablet Take 2 tablets (1,000 mg total) by mouth daily with breakfast. 04/26/15   Kathee Delton, MD  metroNIDAZOLE (FLAGYL) 500 MG tablet Take 1 tablet (500 mg total) by mouth 2 (two) times daily. 01/16/16   Meryl Dare, MD  Multiple Vitamin (MULTIVITAMIN) tablet Take 1 tablet by mouth daily. Reported on 02/09/2015    Historical Provider, MD   Family History Family History  Problem Relation Age of Onset  . Colon cancer Maternal Grandmother   . Breast cancer      aunt  . Depression    . Depression Brother     committed suicide in 70  . Diabetes Mother   . Hyperlipidemia Mother   . Hypertension Mother   . Diabetes Father   . Hyperlipidemia Father   . Hypertension Father   . Arthritis Maternal Aunt   . Esophageal cancer Neg Hx   . Rectal cancer Neg Hx   . Stomach cancer Neg Hx    Social History Social History  Substance Use Topics  . Smoking status: Current Every Day Smoker    Packs/day: 0.50    Types: Cigarettes  . Smokeless tobacco: Never Used  . Alcohol use No   Allergies   Patient has no known allergies.  Review of Systems Review of Systems  Genitourinary: Negative for difficulty urinating, dysuria, frequency, hematuria and urgency.  Musculoskeletal: Positive for back pain.  All other systems reviewed and are negative.  Physical Exam Updated Vital Signs BP 107/83 (BP Location: Left Arm)   Pulse 69   Temp 98.4 F (36.9 C) (Oral)   Resp 19   Ht 5\' 6"  (1.676 m)   Wt 152 lb (68.9 kg)   SpO2 98%   BMI 24.53 kg/m   Physical Exam  Constitutional: She is oriented to person, place, and time. She appears  well-developed and well-nourished. No distress.  HENT:  Head: Normocephalic and atraumatic.  Eyes: EOM are normal.  Neck: Normal range of motion.  Cardiovascular: Normal rate, regular rhythm and normal heart sounds.   Pulmonary/Chest: Effort normal and breath sounds normal.  Abdominal: Soft. She exhibits no distension. There is no tenderness.  Musculoskeletal: Normal range of motion.  Neurological: She is alert and oriented to person, place, and time.  Skin: Skin is warm and dry.  Psychiatric: She has a normal mood and affect. Judgment normal.  Nursing note and vitals reviewed.  ED Treatments / Results  DIAGNOSTIC STUDIES: Oxygen Saturation is 98% on RA, normal by my interpretation.   COORDINATION OF CARE: 1:51 AM-Discussed next steps with pt. Pt verbalized understanding and is  agreeable with the plan.   Labs (all labs ordered are listed, but only abnormal results are displayed) Labs Reviewed - No data to display  EKG  EKG Interpretation None       Radiology No results found.  Procedures Procedures (including critical care time)  Medications Ordered in ED Medications - No data to display   Initial Impression / Assessment and Plan / ED Course  I have reviewed the triage vital signs and the nursing notes.  Pertinent labs & imaging results that were available during my care of the patient were reviewed by me and considered in my medical decision making (see chart for details).  Patient is a 56 year old female who presents with complaints of back pain. This started abruptly while she was sleeping approximately one hour prior to presentation. She also complains of generalized malaise. Shortly after arriving here, her symptoms resolved, and she has no complaints at present. The patient is requesting to go home with no further testing. Her physical examination and vital signs are reassuring. We have agreed that she will go home, and return if she experiences any new or  bothersome symptoms.  Final Clinical Impressions(s) / ED Diagnoses   Final diagnoses:  None   New Prescriptions New Prescriptions   No medications on file   I personally performed the services described in this documentation, which was scribed in my presence. The recorded information has been reviewed and is accurate.     Geoffery Lyons, MD 03/07/16 [phone]

## 2016-05-06 ENCOUNTER — Other Ambulatory Visit: Payer: Self-pay | Admitting: Family Medicine

## 2016-05-06 DIAGNOSIS — E119 Type 2 diabetes mellitus without complications: Secondary | ICD-10-CM

## 2016-05-07 NOTE — Telephone Encounter (Signed)
Patient needs an appt. rx filled for 1 month.

## 2016-05-14 NOTE — Telephone Encounter (Signed)
LM for patient to call back and schedule appointment

## 2016-12-19 ENCOUNTER — Encounter (HOSPITAL_COMMUNITY): Payer: Self-pay | Admitting: Emergency Medicine

## 2016-12-19 ENCOUNTER — Emergency Department (HOSPITAL_COMMUNITY)
Admission: EM | Admit: 2016-12-19 | Discharge: 2016-12-19 | Disposition: A | Discharge status: Home / Self Care | Payer: BC Managed Care – PPO | Attending: Emergency Medicine | Admitting: Emergency Medicine

## 2016-12-19 DIAGNOSIS — F17210 Nicotine dependence, cigarettes, uncomplicated: Secondary | ICD-10-CM | POA: Diagnosis not present

## 2016-12-19 DIAGNOSIS — R358 Other polyuria: Secondary | ICD-10-CM | POA: Diagnosis present

## 2016-12-19 DIAGNOSIS — E119 Type 2 diabetes mellitus without complications: Secondary | ICD-10-CM

## 2016-12-19 DIAGNOSIS — Z79891 Long term (current) use of opiate analgesic: Secondary | ICD-10-CM | POA: Diagnosis not present

## 2016-12-19 DIAGNOSIS — Z7984 Long term (current) use of oral hypoglycemic drugs: Secondary | ICD-10-CM | POA: Insufficient documentation

## 2016-12-19 DIAGNOSIS — E1165 Type 2 diabetes mellitus with hyperglycemia: Secondary | ICD-10-CM | POA: Insufficient documentation

## 2016-12-19 DIAGNOSIS — R739 Hyperglycemia, unspecified: Secondary | ICD-10-CM

## 2016-12-19 LAB — CBC
HEMATOCRIT: 45.6 % (ref 36.0–46.0)
Hemoglobin: 15.4 g/dL — ABNORMAL HIGH (ref 12.0–15.0)
MCH: 30.7 pg (ref 26.0–34.0)
MCHC: 33.8 g/dL (ref 30.0–36.0)
MCV: 90.8 fL (ref 78.0–100.0)
Platelets: 409 10*3/uL — ABNORMAL HIGH (ref 150–400)
RBC: 5.02 MIL/uL (ref 3.87–5.11)
RDW: 12.9 % (ref 11.5–15.5)
WBC: 11.8 10*3/uL — ABNORMAL HIGH (ref 4.0–10.5)

## 2016-12-19 LAB — URINALYSIS, ROUTINE W REFLEX MICROSCOPIC
BILIRUBIN URINE: NEGATIVE
KETONES UR: NEGATIVE mg/dL
Nitrite: NEGATIVE
PH: 5 (ref 5.0–8.0)
PROTEIN: NEGATIVE mg/dL
Specific Gravity, Urine: 1.028 (ref 1.005–1.030)

## 2016-12-19 LAB — BASIC METABOLIC PANEL
Anion gap: 9 (ref 5–15)
BUN: 13 mg/dL (ref 6–20)
CALCIUM: 10.9 mg/dL — AB (ref 8.9–10.3)
CO2: 25 mmol/L (ref 22–32)
CREATININE: 1.14 mg/dL — AB (ref 0.44–1.00)
Chloride: 97 mmol/L — ABNORMAL LOW (ref 101–111)
GFR calc Af Amer: >60 mL/min (ref >60)
GFR, EST NON AFRICAN AMERICAN: 53 mL/min — AB (ref >60)
GLUCOSE: 688 mg/dL — AB (ref 65–99)
Potassium: 4.2 mmol/L (ref 3.5–5.1)
Sodium: 131 mmol/L — ABNORMAL LOW (ref 135–145)

## 2016-12-19 LAB — CBG MONITORING, ED
GLUCOSE-CAPILLARY: 444 mg/dL — AB (ref 65–99)
GLUCOSE-CAPILLARY: 347 mg/dL — AB (ref 65–99)
Glucose-Capillary: >600 mg/dL (ref 65–99)

## 2016-12-19 MED ORDER — SODIUM CHLORIDE 0.9 % IV SOLN
INTRAVENOUS | Status: DC
  Administered 2016-12-19: 19:00:00 via INTRAVENOUS

## 2016-12-19 MED ORDER — METFORMIN HCL ER 500 MG PO TB24: ORAL_TABLET | ORAL | 0 refills | Status: DC

## 2016-12-19 MED ORDER — SODIUM CHLORIDE 0.9 % IV BOLUS (SEPSIS)
2000.0000 mL | Freq: Once | INTRAVENOUS | Status: AC
  Administered 2016-12-19: 2000 mL via INTRAVENOUS

## 2016-12-19 MED ORDER — INSULIN ASPART 100 UNIT/ML ~~LOC~~ SOLN
10.0000 [IU] | Freq: Once | SUBCUTANEOUS | Status: AC
  Administered 2016-12-19: 10 [IU] via SUBCUTANEOUS
  Filled 2016-12-19: qty 1

## 2016-12-19 NOTE — ED Provider Notes (Signed)
[facility]-EMERGENCY DEPT Provider Note   CSN: [id number] Arrival date & time: 12/19/16  1621     History   Chief Complaint Chief Complaint  Patient presents with  . Hyperglycemia    HPI Elizabeth Andrade is a 56 y.o. female.  56 year old female with history of type 2 diabetes who is currently on no medications presents with polyuria and polydipsia as well as headache.  No fever, vomiting, diarrhea.  Does have some diffuse weakness as well as anorexia.  Patient has been noncompliant with her diabetic medications.  Was at home today and took her blood sugar and it was 600 and presents for further treatment.  No treatment used prior to arrival.  Nothing makes her symptoms better or worse.      Past Medical History:  Diagnosis Date  . ABNORMAL PAP SMEAR, LGSIL 02/11/2007   Qualifier: Diagnosis of  By: Sandi MealyAlm  MD, Judeth CornfieldStephanie    . Allergy   . Anemia   . Colitis   . Crohn's disease (HCC)   . Diabetes mellitus without complication (HCC)   . Diabetes mellitus, new onset (HCC) 02/28/2014  . Oropharyngeal candidiasis 02/28/2014    Patient Active Problem List   Diagnosis Date Noted  . Colonic stricture (HCC)   . Colitis 11/13/2015  . Colonic mass   . Abnormal CT scan, gastrointestinal tract   . Vomiting 11/12/2015  . Ileus (HCC) 11/12/2015  . Crohn's colitis, without complications (HCC) 11/10/2015  . Healthcare maintenance 03/29/2014  . Candida esophagitis (HCC) 03/01/2014  . Type 2 diabetes mellitus without complication, without long-term current use of insulin (HCC) 02/28/2014  . Depression with anxiety 02/28/2014  . History of alcohol abuse 02/28/2014  . Tobacco abuse 02/28/2014    Past Surgical History:  Procedure Laterality Date  . CHOLECYSTECTOMY    . COLONOSCOPY    . FLEXIBLE SIGMOIDOSCOPY N/A 11/14/2015   Procedure: FLEXIBLE SIGMOIDOSCOPY;  Surgeon: Ruffin FrederickSteven Paul Armbruster, MD;  [facility]: [facility];  Service: Gastroenterology;  Laterality: N/A;     OB History    No data available       Home Medications    Prior to Admission medications   Medication Sig Start Date End Date Taking? Authorizing Provider  acetaminophen (TYLENOL) 500 MG tablet Take 1,000 mg by mouth every 6 (six) hours as needed for moderate pain. For pain     [provider]  cholecalciferol (VITAMIN D) 1000 UNITS tablet Take 5,000 Units by mouth daily. Reported on 02/09/2015    [provider]  ciprofloxacin (CIPRO) 500 MG tablet Take 1 tablet (500 mg total) by mouth 2 (two) times daily. 01/16/16   Meryl DareStark, Malcolm T, MD  docusate sodium (COLACE) 100 MG capsule Take 1 capsule (100 mg total) by mouth 2 (two) times daily. Take twice daily until surgery 01/16/16   Meryl DareStark, Malcolm T, MD  famotidine (PEPCID) 20 MG tablet Take 1 tablet (20 mg total) by mouth daily. 11/18/15   Osvaldo ShipperKrishnan, Gokul, MD  ferrous sulfate 325 (65 FE) MG tablet Take 325 mg by mouth daily with breakfast. Reported on 02/09/2015    [provider]  gabapentin (NEURONTIN) 100 MG capsule Take 1 capsule (100 mg total) by mouth 2 (two) times daily as needed. 01/23/16   Trixie DredgeWest, Emily, PA-C  metFORMIN (GLUCOPHAGE-XR) 500 MG 24 hr tablet take 2 tablets by mouth once daily WITH BREAKFAST 05/07/16   McKeag, Janine OresIan D, MD  metroNIDAZOLE (FLAGYL) 500 MG tablet Take 1 tablet (500 mg total) by mouth 2 (  two) times daily. 01/16/16   Meryl Dare, MD  Multiple Vitamin (MULTIVITAMIN) tablet Take 1 tablet by mouth daily. Reported on 02/09/2015    [provider]    Family History Family History  Problem Relation Age of Onset  . Colon cancer Maternal Grandmother   . Breast cancer Unknown        aunt  . Depression Unknown   . Depression Brother        committed suicide in 31  . Diabetes Mother   . Hyperlipidemia Mother   . Hypertension Mother   . Diabetes Father   . Hyperlipidemia Father   . Hypertension Father   . Arthritis Maternal Aunt   . Esophageal cancer Neg Hx   . Rectal  cancer Neg Hx   . Stomach cancer Neg Hx     Social History Social History   Tobacco Use  . Smoking status: Current Every Day Smoker    Packs/day: 0.50    Types: Cigarettes  . Smokeless tobacco: Never Used  Substance Use Topics  . Alcohol use: No    Alcohol/week: 0.0 oz  . Drug use: No    Comment: Marijuana in the past     Allergies   Patient has no known allergies.   Review of Systems Review of Systems  All other systems reviewed and are negative.    Physical Exam Updated Vital Signs BP (!) 144/79 (BP Location: Left Arm)   Pulse 91   Temp 98.2 F (36.8 C) (Oral)   Resp 16   Ht 1.676 m (5\' 6" )   Wt 75.3 kg (166 lb)   SpO2 100%   BMI 26.79 kg/m   Physical Exam  Constitutional: She is oriented to person, place, and time. She appears well-developed and well-nourished.  Non-toxic appearance. No distress.  HENT:  Head: Normocephalic and atraumatic.  Eyes: Conjunctivae, EOM and lids are normal. Pupils are equal, round, and reactive to light.  Neck: Normal range of motion. Neck supple. No tracheal deviation present. No thyroid mass present.  Cardiovascular: Normal rate, regular rhythm and normal heart sounds. Exam reveals no gallop.  No murmur heard. Pulmonary/Chest: Effort normal and breath sounds normal. No stridor. No respiratory distress. She has no decreased breath sounds. She has no wheezes. She has no rhonchi. She has no rales.  Abdominal: Soft. Normal appearance and bowel sounds are normal. She exhibits no distension. There is no tenderness. There is no rebound and no CVA tenderness.  Musculoskeletal: Normal range of motion. She exhibits no edema or tenderness.  Neurological: She is alert and oriented to person, place, and time. She has normal strength. No cranial nerve deficit or sensory deficit. GCS eye subscore is 4. GCS verbal subscore is 5. GCS motor subscore is 6.  Skin: Skin is warm and dry. No abrasion and no rash noted.  Psychiatric: She has a normal  mood and affect. Her speech is normal and behavior is normal.  Nursing note and vitals reviewed.    ED Treatments / Results  Labs (all labs ordered are listed, but only abnormal results are displayed) Labs Reviewed  CBG MONITORING, ED - Abnormal; Notable for the following components:      Result Value   Glucose-Capillary >600 (*)    All other components within normal limits  BASIC METABOLIC PANEL  CBC  URINALYSIS, ROUTINE W REFLEX MICROSCOPIC    EKG  EKG Interpretation None       Radiology No results found.  Procedures Procedures (including critical care  time)  Medications Ordered in ED Medications  0.9 %  sodium chloride infusion (not administered)  sodium chloride 0.9 % bolus 2,000 mL (not administered)     Initial Impression / Assessment and Plan / ED Course  I have reviewed the triage vital signs and the nursing notes.  Pertinent labs & imaging results that were available during my care of the patient were reviewed by me and considered in my medical decision making (see chart for details).    Patient's blood sugar has improved after receiving IV fluids as well as insulin.  We will restart her metformin and she will follow-up with her doctor  Final Clinical Impressions(s) / ED Diagnoses   Final diagnoses:  None    ED Discharge Orders    None       Lorre Nick, MD 12/19/16 2012

## 2016-12-19 NOTE — ED Notes (Signed)
Pt ambulatory and independent at discharge.  Verbalized understanding of discharge instructions 

## 2016-12-19 NOTE — ED Triage Notes (Addendum)
Pt reports she has a hx of diabetes, but has not taken her DM medications in a year because she felt well. Has not been monitoring home CBG regularly. Recently she has felt unwell and has eaten more carbs today. Family member checked CBG and found it was 600. Pt also reports she has a yeast infection.

## 2017-03-30 ENCOUNTER — Emergency Department (HOSPITAL_COMMUNITY)
Admission: EM | Admit: 2017-03-30 | Discharge: 2017-03-30 | Disposition: A | Discharge status: Home / Self Care | Payer: BC Managed Care – PPO | Attending: Emergency Medicine | Admitting: Emergency Medicine

## 2017-03-30 ENCOUNTER — Encounter (HOSPITAL_COMMUNITY): Payer: Self-pay | Admitting: *Deleted

## 2017-03-30 DIAGNOSIS — R110 Nausea: Secondary | ICD-10-CM

## 2017-03-30 DIAGNOSIS — F17210 Nicotine dependence, cigarettes, uncomplicated: Secondary | ICD-10-CM | POA: Insufficient documentation

## 2017-03-30 DIAGNOSIS — E119 Type 2 diabetes mellitus without complications: Secondary | ICD-10-CM | POA: Insufficient documentation

## 2017-03-30 DIAGNOSIS — R197 Diarrhea, unspecified: Secondary | ICD-10-CM

## 2017-03-30 LAB — CBC WITH DIFFERENTIAL/PLATELET
BASOS ABS: 0 10*3/uL (ref 0.0–0.1)
BASOS PCT: 0 %
Eosinophils Absolute: 0.1 10*3/uL (ref 0.0–0.7)
Eosinophils Relative: 1 %
HEMATOCRIT: 43.8 % (ref 36.0–46.0)
Hemoglobin: 14.4 g/dL (ref 12.0–15.0)
LYMPHS PCT: 40 %
Lymphs Abs: 3.2 10*3/uL (ref 0.7–4.0)
MCH: 30.8 pg (ref 26.0–34.0)
MCHC: 32.9 g/dL (ref 30.0–36.0)
MCV: 93.8 fL (ref 78.0–100.0)
Monocytes Absolute: 0.7 10*3/uL (ref 0.1–1.0)
Monocytes Relative: 9 %
NEUTROS ABS: 4 10*3/uL (ref 1.7–7.7)
Neutrophils Relative %: 50 %
Platelets: 465 10*3/uL — ABNORMAL HIGH (ref 150–400)
RBC: 4.67 MIL/uL (ref 3.87–5.11)
RDW: 13.5 % (ref 11.5–15.5)
WBC: 8.1 10*3/uL (ref 4.0–10.5)

## 2017-03-30 LAB — COMPREHENSIVE METABOLIC PANEL
ALT: 17 U/L (ref 14–54)
ANION GAP: 9 (ref 5–15)
AST: 20 U/L (ref 15–41)
Albumin: 3.9 g/dL (ref 3.5–5.0)
Alkaline Phosphatase: 97 U/L (ref 38–126)
BUN: 17 mg/dL (ref 6–20)
CHLORIDE: 106 mmol/L (ref 101–111)
CO2: 23 mmol/L (ref 22–32)
Calcium: 10.4 mg/dL — ABNORMAL HIGH (ref 8.9–10.3)
Creatinine, Ser: 0.76 mg/dL (ref 0.44–1.00)
Glucose, Bld: 167 mg/dL — ABNORMAL HIGH (ref 65–99)
POTASSIUM: 3.5 mmol/L (ref 3.5–5.1)
Sodium: 138 mmol/L (ref 135–145)
TOTAL PROTEIN: 7.8 g/dL (ref 6.5–8.1)
Total Bilirubin: 0.8 mg/dL (ref 0.3–1.2)

## 2017-03-30 LAB — LIPASE, BLOOD: LIPASE: 23 U/L (ref 11–51)

## 2017-03-30 MED ORDER — SODIUM CHLORIDE 0.9 % IV BOLUS (SEPSIS)
1000.0000 mL | Freq: Once | INTRAVENOUS | Status: AC
  Administered 2017-03-30: 1000 mL via INTRAVENOUS

## 2017-03-30 MED ORDER — ONDANSETRON HCL 4 MG/2ML IJ SOLN
4.0000 mg | Freq: Once | INTRAMUSCULAR | Status: AC
  Administered 2017-03-30: 4 mg via INTRAVENOUS
  Filled 2017-03-30: qty 2

## 2017-03-30 MED ORDER — ONDANSETRON 4 MG PO TBDP: 4.0000 mg | ORAL_TABLET | Freq: Three times a day (TID) | ORAL | 0 refills | Status: DC | PRN

## 2017-03-30 MED ORDER — LOPERAMIDE HCL 2 MG PO CAPS
2.0000 mg | ORAL_CAPSULE | Freq: Once | ORAL | Status: AC
  Administered 2017-03-30: 2 mg via ORAL
  Filled 2017-03-30: qty 1

## 2017-03-30 MED ORDER — LOPERAMIDE HCL 2 MG PO CAPS: 2.0000 mg | ORAL_CAPSULE | Freq: Four times a day (QID) | ORAL | 0 refills | Status: DC | PRN

## 2017-03-30 NOTE — ED Triage Notes (Signed)
Pt complains of diarrhea and abdominal pain x 2 days. Pt states she has felt nauseas, denies emesis.

## 2017-03-30 NOTE — ED Provider Notes (Signed)
Emergency Department Provider Note   I have reviewed the triage vital signs and the nursing notes.   HISTORY  Chief Complaint Diarrhea and Abdominal Pain   HPI Elizabeth Andrade is a 57 y.o. female with PMH of Crohn's disease, DM, and colonic stricture presents to the ED with intractable diarrhea for the last 3 days.  The patient states she is had diarrhea approximately every 2-3 hours.  She feels like she may have a viral illness which she may have gotten it from work.  She is not aware of any other coworkers being sick.  Denies any fevers or shaking chills.  She denies any blood in the diarrhea.  She is having occasional mild abdominal cramping with diarrhea but no specific pain.  She states this does not feel anything like her Crohn's disease flares.  Denies any vomiting but has had some occasional nausea.  No chest pain or difficulty breathing.  She has not taken any medications at home to help control symptoms.  She is been drinking Sprite zero and Gatorade.   Past Medical History:  Diagnosis Date  . ABNORMAL PAP SMEAR, LGSIL 02/11/2007   Qualifier: Diagnosis of  By: Sandi Mealy  MD, Judeth Cornfield    . Allergy   . Anemia   . Colitis   . Crohn's disease (HCC)   . Diabetes mellitus without complication (HCC)   . Diabetes mellitus, new onset (HCC) 02/28/2014  . Oropharyngeal candidiasis 02/28/2014    Patient Active Problem List   Diagnosis Date Noted  . Colonic stricture (HCC)   . Colitis 11/13/2015  . Colonic mass   . Abnormal CT scan, gastrointestinal tract   . Vomiting 11/12/2015  . Ileus (HCC) 11/12/2015  . Crohn's colitis, without complications (HCC) 11/10/2015  . Healthcare maintenance 03/29/2014  . Candida esophagitis (HCC) 03/01/2014  . Type 2 diabetes mellitus without complication, without Demiana Crumbley-term current use of insulin (HCC) 02/28/2014  . Depression with anxiety 02/28/2014  . History of alcohol abuse 02/28/2014  . Tobacco abuse 02/28/2014    Past Surgical History:    Procedure Laterality Date  . CHOLECYSTECTOMY    . COLONOSCOPY    . FLEXIBLE SIGMOIDOSCOPY N/A 11/14/2015   Procedure: FLEXIBLE SIGMOIDOSCOPY;  Surgeon: Ruffin Frederick, MD;  Location: [facility];  Service: Gastroenterology;  Laterality: N/A;    Current Outpatient Rx  . Order #: [id number] Class: Historical Med  . Order #: [id number] Class: Print  . Order #: [id number] Class: Historical Med  . Order #: [id number] Class: Print  . Order #: [id number] Class: Print    Allergies Patient has no known allergies.  Family History  Problem Relation Age of Onset  . Colon cancer Maternal Grandmother   . Breast cancer Unknown        aunt  . Depression Unknown   . Depression Brother        committed suicide in 7  . Diabetes Mother   . Hyperlipidemia Mother   . Hypertension Mother   . Diabetes Father   . Hyperlipidemia Father   . Hypertension Father   . Arthritis Maternal Aunt   . Esophageal cancer Neg Hx   . Rectal cancer Neg Hx   . Stomach cancer Neg Hx     Social History Social History   Tobacco Use  . Smoking status: Current Every Day Smoker    Packs/day: 0.50    Types: Cigarettes  . Smokeless tobacco: Never Used  Substance Use Topics  . Alcohol use: No    Alcohol/week: 0.0 oz  .  Drug use: No    Comment: Marijuana in the past    Review of Systems  Constitutional: No fever/chills Eyes: No visual changes. ENT: No sore throat. Cardiovascular: Denies chest pain. Respiratory: Denies shortness of breath. Gastrointestinal: No abdominal pain. Positive nausea, no vomiting. Positive diarrhea.  No constipation. Genitourinary: Negative for dysuria. Musculoskeletal: Negative for back pain. Skin: Negative for rash. Neurological: Negative for headaches, focal weakness or numbness.  10-point ROS otherwise negative.  ____________________________________________   PHYSICAL EXAM:  VITAL SIGNS: ED Triage Vitals  Enc Vitals Group     BP 03/30/17 0851 134/80     Pulse  Rate 03/30/17 0851 89     Resp 03/30/17 0851 18     Temp 03/30/17 0851 98.6 F (37 C)     Temp Source 03/30/17 0851 Oral     SpO2 03/30/17 0851 100 %     Pain Score 03/30/17 0852 7   Constitutional: Alert and oriented. Well appearing and in no acute distress. Eyes: Conjunctivae are normal. Head: Atraumatic. Nose: No congestion/rhinnorhea. Mouth/Throat: Mucous membranes are dry.  Neck: No stridor.  Cardiovascular: Normal rate, regular rhythm. Good peripheral circulation. Grossly normal heart sounds.   Respiratory: Normal respiratory effort.  No retractions. Lungs CTAB. Gastrointestinal: Soft and non-tender to diffuse deep palpation. No distention.  Musculoskeletal: No lower extremity tenderness nor edema. No gross deformities of extremities. Neurologic:  Normal speech and language. No gross focal neurologic deficits are appreciated.  Skin:  Skin is warm, dry and intact. No rash noted.  ____________________________________________   LABS (all labs ordered are listed, but only abnormal results are displayed)  Labs Reviewed  COMPREHENSIVE METABOLIC PANEL - Abnormal; Notable for the following components:      Result Value   Glucose, Bld 167 (*)    Calcium 10.4 (*)    All other components within normal limits  CBC WITH DIFFERENTIAL/PLATELET - Abnormal; Notable for the following components:   Platelets 465 (*)    All other components within normal limits  LIPASE, BLOOD   ____________________________________________  RADIOLOGY  None ____________________________________________   PROCEDURES  Procedure(s) performed:   Procedures  None  ____________________________________________   INITIAL IMPRESSION / ASSESSMENT AND PLAN / ED COURSE  Pertinent labs & imaging results that were available during my care of the patient were reviewed by me and considered in my medical decision making (see chart for details).  Patient presents to the emergency department for evaluation of  diarrhea for the past several days.  She has no significant tenderness on exam.  She does not feel this is consistent with prior Crohn's flares that she has had.  She is afebrile with normal vital signs.  I do not see an indication for CT imaging at this time.  Plan for IV fluids, labs, treatment of symptoms and reassess.   11:49 AM Lab work reviewed without acute abnormality.  The patient continues to have no abdominal pain.  She is feeling better after IV fluids and Zofran along with Imodium.  Plan to continue oral hydration at home.  I will provide medications for symptom relief.  Discussed concerning symptoms and reasons to return to the emergency department in detail. Provided a work excuse note.   At this time, I do not feel there is any life-threatening condition present. I have reviewed and discussed all results (EKG, imaging, lab, urine as appropriate), exam findings with patient. I have reviewed nursing notes and appropriate previous records.  I feel the patient is safe to be discharged home  without further emergent workup. Discussed usual and customary return precautions. Patient and family (if present) verbalize understanding and are comfortable with this plan.  Patient will follow-up with their primary care provider. If they do not have a primary care provider, information for follow-up has been provided to them. All questions have been answered.  ____________________________________________  FINAL CLINICAL IMPRESSION(S) / ED DIAGNOSES  Final diagnoses:  Diarrhea, unspecified type  Nausea     MEDICATIONS GIVEN DURING THIS VISIT:  Medications  sodium chloride 0.9 % bolus 1,000 mL (0 mLs Intravenous Stopped 03/30/17 1058)  ondansetron (ZOFRAN) injection 4 mg (4 mg Intravenous Given 03/30/17 0934)  loperamide (IMODIUM) capsule 2 mg (2 mg Oral Given 03/30/17 0934)     NEW OUTPATIENT MEDICATIONS STARTED DURING THIS VISIT:  New Prescriptions   LOPERAMIDE (IMODIUM) 2 MG CAPSULE    Take 1  capsule (2 mg total) by mouth 4 (four) times daily as needed for diarrhea or loose stools.   ONDANSETRON (ZOFRAN ODT) 4 MG DISINTEGRATING TABLET    Take 1 tablet (4 mg total) by mouth every 8 (eight) hours as needed for nausea or vomiting.    Note:  This document was prepared using Dragon voice recognition software and may include unintentional dictation errors.  Alona Bene, MD Emergency Medicine    Brentlee Delage, Arlyss Repress, MD 03/30/17 1150

## 2017-03-30 NOTE — Discharge Instructions (Signed)
As we discussed, we believe her symptoms are caused today by mild volume depletion, or mild dehydration, without any evidence of damage to your body.  Please drink plenty of clear fluids such as water and/or Gatorade and follow up with your regular doctor or the doctors listed in his documentation at the next available opportunity.  Return to the emergency department with any new or worsening symptoms that concern you, including but not limited to fever, shortness of breath, chest pain, or other concerning symptoms. ° ° °Dehydration, Adult °Dehydration is when you lose more fluids from the body than you take in. Vital organs like the kidneys, brain, and heart cannot function without a proper amount of fluids and salt. Any loss of fluids from the body can cause dehydration.  °CAUSES  °Vomiting. °Diarrhea. °Excessive sweating. °Excessive urine output. °Fever. °SYMPTOMS  °Mild dehydration °Thirst. °Dry lips. °Slightly dry mouth. °Moderate dehydration °Very dry mouth. °Sunken eyes. °Skin does not bounce back quickly when lightly pinched and released. °Dark urine and decreased urine production. °Decreased tear production. °Headache. °Severe dehydration °Very dry mouth. °Extreme thirst. °Rapid, weak pulse (more than 100 beats per minute at rest). °Cold hands and feet. °Not able to sweat in spite of heat and temperature. °Rapid breathing. °Blue lips. °Confusion and lethargy. °Difficulty being awakened. °Minimal urine production. °No tears. °DIAGNOSIS  °Your caregiver will diagnose dehydration based on your symptoms and your exam. Blood and urine tests will help confirm the diagnosis. The diagnostic evaluation should also identify the cause of dehydration. °TREATMENT  °Treatment of mild or moderate dehydration can often be done at home by increasing the amount of fluids that you drink. It is best to drink small amounts of fluid more often. Drinking too much at one time can make vomiting worse. Refer to the home care  instructions below. °Severe dehydration needs to be treated at the hospital where you will probably be given intravenous (IV) fluids that contain water and electrolytes. °HOME CARE INSTRUCTIONS  °Ask your caregiver about specific rehydration instructions. °Drink enough fluids to keep your urine clear or pale yellow. °Drink small amounts frequently if you have nausea and vomiting. °Eat as you normally do. °Avoid: °Foods or drinks high in sugar. °Carbonated drinks. °Juice. °Extremely hot or cold fluids. °Drinks with caffeine. °Fatty, greasy foods. °Alcohol. °Tobacco. °Overeating. °Gelatin desserts. °Wash your hands well to avoid spreading bacteria and viruses. °Only take over-the-counter or prescription medicines for pain, discomfort, or fever as directed by your caregiver. °Ask your caregiver if you should continue all prescribed and over-the-counter medicines. °Keep all follow-up appointments with your caregiver. °SEEK MEDICAL CARE IF: °You have abdominal pain and it increases or stays in one area (localizes). °You have a rash, stiff neck, or severe headache. °You are irritable, sleepy, or difficult to awaken. °You are weak, dizzy, or extremely thirsty. °SEEK IMMEDIATE MEDICAL CARE IF:  °You are unable to keep fluids down or you get worse despite treatment. °You have frequent episodes of vomiting or diarrhea. °You have blood or green matter (bile) in your vomit. °You have blood in your stool or your stool looks black and tarry. °You have not urinated in 6 to 8 hours, or you have only urinated a small amount of very dark urine. °You have a fever. °You faint. °MAKE SURE YOU:  °Understand these instructions. °Will watch your condition. °Will get help right away if you are not doing well or get worse. °Document Released: 01/14/2005 Document Revised: 04/08/2011 Document Reviewed: 09/03/2010 °ExitCare® Patient Information ©2015   [employer]. This information is not intended to replace advice given to you by your health  care provider. Make sure you discuss any questions you have with your health care provider. ° °Rehydration, Adult °Rehydration is the replacement of body fluids lost during dehydration. Dehydration is an extreme loss of body fluids to the point of body function impairment. There are many ways extreme fluid loss can occur, including vomiting, diarrhea, or excess sweating. Recovering from dehydration requires replacing lost fluids, continuing to eat to maintain strength, and avoiding foods and beverages that may contribute to further fluid loss or may increase nausea. °HOW TO REHYDRATE °In most cases, rehydration involves the replacement of not only fluids but also carbohydrates and basic body salts. Rehydration with an oral rehydration solution is one way to replace essential nutrients lost through dehydration. °An oral rehydration solution can be purchased at pharmacies, retail stores, and online. Premixed packets of powder that you combine with water to make a solution are also sold. You can prepare an oral rehydration solution at home by mixing the following ingredients together:  ° - tsp table salt. °¾ tsp baking soda. ° tsp salt substitute containing potassium chloride. °1 tablespoons sugar. °1 L (34 oz) of water. °Be sure to use exact measurements. Including too much sugar can make diarrhea worse. °Drink ½-1 cup (120-240 mL) of oral rehydration solution each time you have diarrhea or vomit. If drinking this amount makes your vomiting worse, try drinking smaller amounts more often. For example, drink 1-3 tsp every 5-10 minutes.  °A general rule for staying hydrated is to drink 1½-2 L of fluid per day. Talk to your caregiver about the specific amount you should be drinking each day. Drink enough fluids to keep your urine clear or pale yellow. °EATING WHEN DEHYDRATED °Even if you have had severe sweating or you are having diarrhea, do not stop eating. Many healthy items in a normal diet are okay to continue eating  while recovering from dehydration. The following tips can help you to lessen nausea when you eat: °Ask someone else to prepare your food. Cooking smells may worsen nausea. °Eat in a well-ventilated room away from cooking smells. °Sit up when you eat. Avoid lying down until 1-2 hours after eating. °Eat small amounts when you eat. °Eat foods that are easy to digest. These include soft, well-cooked, or mashed foods. °FOODS AND BEVERAGES TO AVOID °Avoid eating or drinking the following foods and beverages that may increase nausea or further loss of fluid:  °Fruit juices with a high sugar content, such as concentrated juices. °Alcohol. °Beverages containing caffeine. °Carbonated drinks. They may cause a lot of gas. °Foods that may cause a lot of gas, such as cabbage, broccoli, and beans. °Fatty, greasy, and fried foods. °Spicy, very salty, and very sweet foods or drinks. °Foods or drinks that are very hot or very cold. Consume food or drinks at or near room temperature. °Foods that need a lot of chewing, such as raw vegetables. °Foods that are sticky or hard to swallow, such as peanut butter. °Document Released: 04/08/2011 Document Revised: 10/09/2011 Document Reviewed: 04/08/2011 °ExitCare® Patient Information ©2015 [employer]. This information is not intended to replace advice given to you by your health care provider. Make sure you discuss any questions you have with your health care provider. ° ° ° °

## 2017-06-17 ENCOUNTER — Other Ambulatory Visit: Payer: Self-pay | Admitting: Family Medicine

## 2017-06-17 DIAGNOSIS — E119 Type 2 diabetes mellitus without complications: Secondary | ICD-10-CM

## 2018-03-04 ENCOUNTER — Ambulatory Visit: Payer: BC Managed Care – PPO | Admitting: Family Medicine

## 2018-03-30 ENCOUNTER — Encounter: Payer: Self-pay | Admitting: Family Medicine

## 2018-03-30 ENCOUNTER — Other Ambulatory Visit: Payer: Self-pay

## 2018-03-30 ENCOUNTER — Other Ambulatory Visit (HOSPITAL_COMMUNITY)
Admission: RE | Admit: 2018-03-30 | Discharge: 2018-03-30 | Disposition: A | Discharge status: Home / Self Care | Payer: BC Managed Care – PPO | Source: Ambulatory Visit | Attending: Family Medicine | Admitting: Family Medicine

## 2018-03-30 ENCOUNTER — Ambulatory Visit (INDEPENDENT_AMBULATORY_CARE_PROVIDER_SITE_OTHER): Payer: BC Managed Care – PPO | Admitting: Family Medicine

## 2018-03-30 VITALS — BP 130/54 | HR 64 | Temp 98.6°F | Wt 141.2 lb

## 2018-03-30 DIAGNOSIS — E119 Type 2 diabetes mellitus without complications: Secondary | ICD-10-CM | POA: Diagnosis not present

## 2018-03-30 DIAGNOSIS — Z0000 Encounter for general adult medical examination without abnormal findings: Secondary | ICD-10-CM

## 2018-03-30 DIAGNOSIS — Z1159 Encounter for screening for other viral diseases: Secondary | ICD-10-CM | POA: Diagnosis not present

## 2018-03-30 DIAGNOSIS — N898 Other specified noninflammatory disorders of vagina: Secondary | ICD-10-CM | POA: Insufficient documentation

## 2018-03-30 DIAGNOSIS — L282 Other prurigo: Secondary | ICD-10-CM | POA: Insufficient documentation

## 2018-03-30 DIAGNOSIS — Z720 Tobacco use: Secondary | ICD-10-CM

## 2018-03-30 LAB — POCT WET PREP (WET MOUNT)
CLUE CELLS WET PREP WHIFF POC: NEGATIVE
Trichomonas Wet Prep HPF POC: ABSENT

## 2018-03-30 LAB — POCT GLYCOSYLATED HEMOGLOBIN (HGB A1C): HbA1c, POC (controlled diabetic range): 14 % — AB (ref 0.0–7.0)

## 2018-03-30 MED ORDER — FLUCONAZOLE 150 MG PO TABS: 150.0000 mg | ORAL_TABLET | Freq: Once | ORAL | 0 refills | Status: AC

## 2018-03-30 MED ORDER — METFORMIN HCL 1000 MG PO TABS: 1000.0000 mg | ORAL_TABLET | Freq: Two times a day (BID) | ORAL | 3 refills | Status: DC

## 2018-03-30 MED ORDER — PERMETHRIN 5 % EX CREA: 1.0000 "application " | TOPICAL_CREAM | CUTANEOUS | 0 refills | Status: AC

## 2018-03-30 MED ORDER — BAYER CONTOUR MONITOR DEVI: 1.0000 | Freq: Once | 0 refills | Status: AC

## 2018-03-30 MED ORDER — GLUCOSE BLOOD VI STRP: ORAL_STRIP | 12 refills | Status: DC

## 2018-03-30 NOTE — Progress Notes (Signed)
    Subjective:    Patient ID: Elizabeth Andrade, female    DOB: December 12, 1960, 58 y.o.   MRN: [id number]   CC: rash on body  HPI: itchy rash started 2 weeks ago, worst in groin. States this is "driving her crazy". It is on her trunk, abdomen, arms, legs. Spares head. Unclear if hands and feet are involved. No new medications or exposures. No one else at home has the rash. She feels there may be some vaginal involvement as her vaginal area is itchy as well and she has some new discharge. No recent illnesses.  She reports she is not on any medication for diabetes. She reports she drinks about half a gallon of sweet tea daily as well as sodas and eats a lot of carb heavy foods. She has some blurred vision. She has some tingling in hands and feet. Denies increased thirst, hunger, or urination.  She denies hx of high blood pressure and adamantly refuses to start BP medication at this time.   Health maintenance- due for flu, pneumonia vaccine. Hep C screening will be added to labs today. Pap will be done today. Due for eye exam and mammogram. Refuses flu and pneumonia vaccine  Smoking status reviewed- current every day smoker. Smokes about a pack per day.   Review of Systems- see HPI   Objective:  BP (!) 130/54   Pulse 64   Temp 98.6 F (37 C) (Oral)   Wt 141 lb 4 oz (64.1 kg)   SpO2 99%   BMI 22.80 kg/m  Vitals and nursing note reviewed  General: well nourished, in no acute distress HEENT: normocephalic, MMM Cardiac: RRR, clear S1 and S2, no murmurs, rubs, or gallops Respiratory: clear to auscultation bilaterally, no increased work of breathing Extremities: no edema or cyanosis. Warm, well perfused. 2+ radial and PT pulses bilaterally Skin: warm and dry. Diffuse maculopapular rash over extremities, abdomen, trunk, with evidence of excoriation Neuro: alert and oriented, no focal deficits   Assessment & Plan:    Type 2 diabetes mellitus without complication, without long-term current  use of insulin (HCC)  A1C today 14. She has not been on any medication for several years. Patient refused insulin although I recommended this. She states she will make dietary changes and cut out sweet tea, sodas, desserts, breads, etc. She is open to restarting metformin. rx for 1000 mg BID sent to pharmacy. Glucometer sent to pharmacy. Asked patient to keep track of fasting CBGs and bring to next appt. With elevated BP today advised starting ace or arb, patient refused. Will check CMP, lipids. Follow up 1 month.  Pruritic rash  Rash concerning for scabies but also considering yeast etiology. Will treat for both with diflucan 150 mg x 1 and permethrin cream. Discussed washing bedding and drying at high temp. Follow up if no improvement.   Healthcare maintenance  Pap done today, awaiting results Patient declined flu, tdap, pneumonia vaccine She states she plans to make eye appt and mammo appointment Hep C labs checked today  Tobacco abuse  Encouraged cessation.  Vaginal discharge  Wet prep negative.     Return in about 4 weeks (around 04/27/2018).   Dolores Patty, DO Family Medicine Resident PGY-3

## 2018-03-30 NOTE — Patient Instructions (Addendum)
  Please get your eye exam and mammogram done  [facility] check labs today  Please take metformin twice a day Please check your blood sugars every morning and write the number down, bring this to next appointment.  Take diflucan today once Apply permethrin cream from neck down tonight before bed Repeat this in 1 week Wash bedding in hot water and dry on high heat  We'll see you back in 1 month to follow up  If you have questions or concerns please do not hesitate to call at [phone].  Dolores Patty, DO PGY-3, [facility] 03/30/2018 9:37 AM

## 2018-03-31 LAB — COMPREHENSIVE METABOLIC PANEL
A/G RATIO: 1.6 (ref 1.2–2.2)
ALBUMIN: 4.4 g/dL (ref 3.8–4.9)
ALT: 8 IU/L (ref 0–32)
AST: 7 IU/L (ref 0–40)
Alkaline Phosphatase: 138 IU/L — ABNORMAL HIGH (ref 39–117)
BUN / CREAT RATIO: 12 (ref 9–23)
BUN: 9 mg/dL (ref 6–24)
Bilirubin Total: 0.6 mg/dL (ref 0.0–1.2)
CALCIUM: 10.9 mg/dL — AB (ref 8.7–10.2)
CO2: 23 mmol/L (ref 20–29)
Chloride: 101 mmol/L (ref 96–106)
Creatinine, Ser: 0.77 mg/dL (ref 0.57–1.00)
GFR, EST AFRICAN AMERICAN: 99 mL/min/{1.73_m2} (ref >59)
GFR, EST NON AFRICAN AMERICAN: 86 mL/min/{1.73_m2} (ref >59)
GLOBULIN, TOTAL: 2.8 g/dL (ref 1.5–4.5)
Glucose: 318 mg/dL — ABNORMAL HIGH (ref 65–99)
POTASSIUM: 4 mmol/L (ref 3.5–5.2)
SODIUM: 138 mmol/L (ref 134–144)
Total Protein: 7.2 g/dL (ref 6.0–8.5)

## 2018-03-31 LAB — LIPID PANEL
CHOL/HDL RATIO: 4.8 ratio — AB (ref 0.0–4.4)
Cholesterol, Total: 221 mg/dL — ABNORMAL HIGH (ref 100–199)
HDL: 46 mg/dL (ref >39)
LDL CALC: 149 mg/dL — AB (ref 0–99)
TRIGLYCERIDES: 132 mg/dL (ref 0–149)
VLDL Cholesterol Cal: 26 mg/dL (ref 5–40)

## 2018-03-31 LAB — HEPATITIS C ANTIBODY: HEP C VIRUS AB: 0.1 {s_co_ratio} (ref 0.0–0.9)

## 2018-04-01 ENCOUNTER — Telehealth: Payer: Self-pay | Admitting: Family Medicine

## 2018-04-01 NOTE — Telephone Encounter (Signed)
  Attempted to call Ms. Poland to discuss labs. Left voicemail for her to return call.   Lab results indicate elevated cholesterol. Given her diabetes I would strongly encourage starting a statin. She had strongly opposed medications at her visit and I will await her decision before sending in a prescription. Her hep C screening was negative.   Please relay this if she calls back.  I will see her for follow up in 4 weeks.  Dolores Patty, DO PGY-3, [facility] 04/01/2018 12:02 PM

## 2018-04-01 NOTE — Assessment & Plan Note (Signed)
  Pap done today, awaiting results Patient declined flu, tdap, pneumonia vaccine She states she plans to make eye appt and mammo appointment Hep C labs checked today

## 2018-04-01 NOTE — Assessment & Plan Note (Signed)
Wet prep negative.  

## 2018-04-01 NOTE — Assessment & Plan Note (Signed)
  A1C today 14. She has not been on any medication for several years. Patient refused insulin although I recommended this. She states she will make dietary changes and cut out sweet tea, sodas, desserts, breads, etc. She is open to restarting metformin. rx for 1000 mg BID sent to pharmacy. Glucometer sent to pharmacy. Asked patient to keep track of fasting CBGs and bring to next appt. With elevated BP today advised starting ace or arb, patient refused. Will check CMP, lipids. Follow up 1 month.

## 2018-04-01 NOTE — Assessment & Plan Note (Signed)
  Rash concerning for scabies but also considering yeast etiology. Will treat for both with diflucan 150 mg x 1 and permethrin cream. Discussed washing bedding and drying at high temp. Follow up if no improvement.

## 2018-04-01 NOTE — Assessment & Plan Note (Signed)
Encouraged cessation.

## 2018-04-02 LAB — CYTOLOGY - PAP
Diagnosis: NEGATIVE
HPV (WINDOPATH): NOT DETECTED

## 2018-04-03 NOTE — Telephone Encounter (Signed)
LVM on pt mobile number. Asking her to give [place] a call back. Will try again a later on today. Aquilla Solian, CMA

## 2018-04-06 NOTE — Telephone Encounter (Signed)
LVM on pts mobile number. Stating that her results showed elevated cholesterol, and Dr. Wonda Olds suggest she starts on a statin. Also informed her that her Hep C came back Negative. Asked pt to give [place] a call back. Aquilla Solian, CMA

## 2018-04-20 NOTE — Telephone Encounter (Signed)
Pt called nurse line requesting a letter for work. Pt stated she has diabetes and chrons and is considered a "high risk patient." Pt works for [employer] and she does not anticipate returning to work until April 20th. In order get "leave" the note needs to excuse her from 3/30-4/20. Pt plans to work the rest of this week, but that is all. Please advise.  I also asked her about starting a statin, at this time patient is going to try and control her cholesterol with her diet.

## 2018-04-21 MED ORDER — ROSUVASTATIN CALCIUM 20 MG PO TABS: 20.0000 mg | ORAL_TABLET | Freq: Every day | ORAL | 2 refills | Status: DC

## 2018-04-21 NOTE — Telephone Encounter (Addendum)
Please write requested letter for patient- I'm unable to for some reason?  Her 10 year risk for having a heart attack or stroke given her DM plus elevated cholesterol is 12.1%. Sending in a prescription for high intensity statin.  Dolores Patty, DO PGY-3,  Family Medicine 04/21/2018 8:23 PM

## 2018-04-21 NOTE — Addendum Note (Signed)
Addended by: Tillman Sers on: 04/21/2018 08:40 PM   Modules accepted: Orders

## 2018-04-23 ENCOUNTER — Telehealth: Payer: Self-pay | Admitting: Family Medicine

## 2018-04-23 NOTE — Telephone Encounter (Signed)
FMLA form dropped off for at front desk for completion.  Verified that patient section of form has been completed.  Last DOS/WCC with PCP was 03/30/18.  Placed form in team folder to be completed by clinical staff.  Chari Manning

## 2018-04-23 NOTE — Telephone Encounter (Signed)
Clinical info completed on *FMLA  form.  Place form in PCP's box for completion.  Jennings Corado, CMA   

## 2018-04-29 NOTE — Telephone Encounter (Signed)
Copy also made for batch scanning/

## 2018-04-29 NOTE — Telephone Encounter (Signed)
Form filled out and placed in RN box for pt to pick up

## 2018-04-29 NOTE — Telephone Encounter (Signed)
Faxed to appropriate number that was left on the form.

## 2018-05-05 ENCOUNTER — Ambulatory Visit: Payer: BC Managed Care – PPO | Admitting: Family Medicine

## 2018-05-13 NOTE — Telephone Encounter (Signed)
Yes please

## 2018-05-13 NOTE — Telephone Encounter (Signed)
Pt called nurse line wanting her letter extended until 5/15, due to school not starting back until then. Ok to extend?

## 2018-05-14 NOTE — Telephone Encounter (Signed)
Attempted to contact patient to inform her I will write letter for her to reflect new dates. No answer and VM was left.

## 2018-05-15 NOTE — Telephone Encounter (Signed)
Patient called nurse line stating she no longer needs a note.

## 2018-09-01 IMAGING — CT CT ABD-PELV W/ CM
2 of 5 series · 15 of 46 positions shown, 17 images · IV contrast (ISOVUE)
Comparison: Abdominal radiograph dated [DATE] and dated 02/07/2009

CLINICAL DATA: 55-year-old female with abdominal. Patient is unable
to tolerate food.

EXAM:
CT ABDOMEN AND PELVIS WITH CONTRAST
TECHNIQUE: Multidetector CT imaging of the abdomen and pelvis was performed
using the standard protocol following bolus administration of
intravenous contrast.
CONTRAST:  30mL IR9JPD-HRR IOPAMIDOL (IR9JPD-HRR) INJECTION 61%,
100mL IR9JPD-HRR IOPAMIDOL (IR9JPD-HRR) INJECTION 61%

[Series 2: abd/pel with · axial · 0.80mm/px · z∈[-288,+122]mm · 12 of 96 slices shown, 14 images]
[im 7/96  soft-tissue]
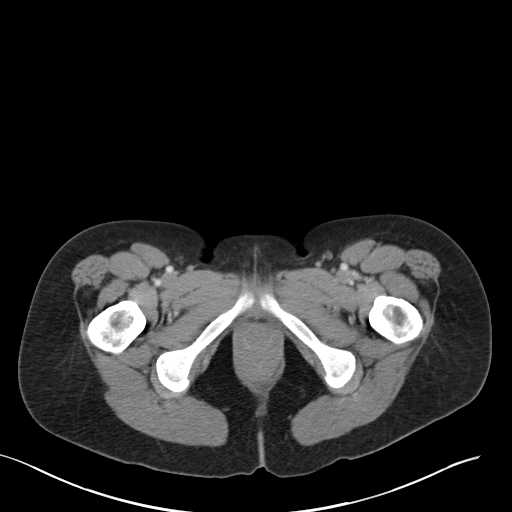
[im 7/96  bone]
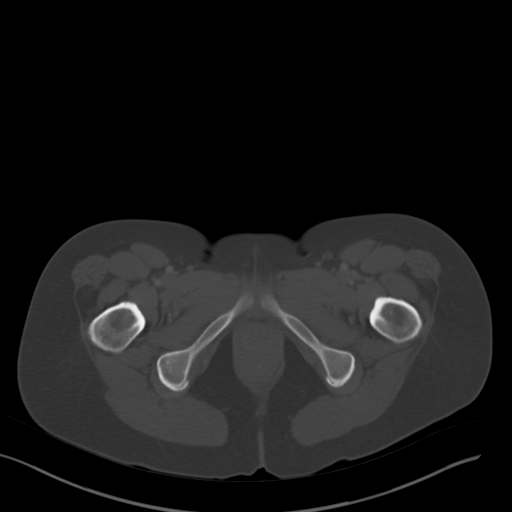
[im 14/96  soft-tissue]
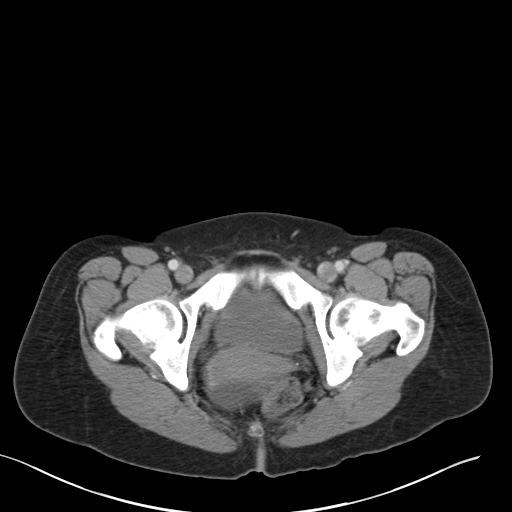
[im 21/96  soft-tissue]
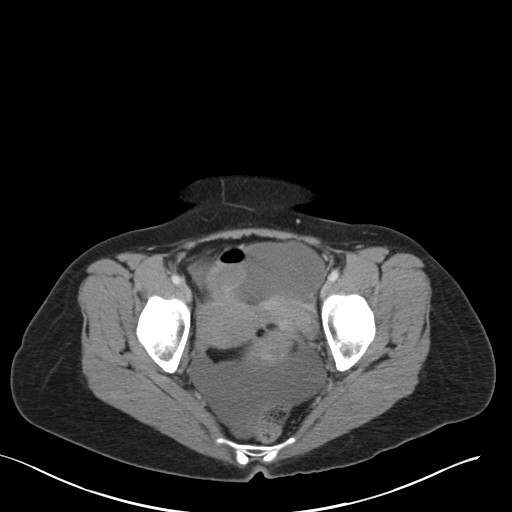
[im 28/96  soft-tissue]
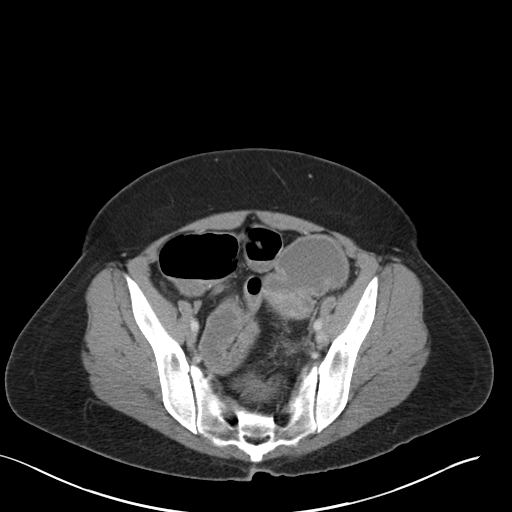
[im 34/96  soft-tissue]
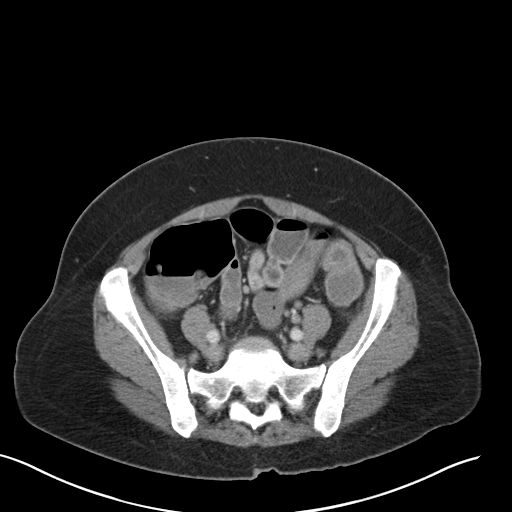
[im 41/96  soft-tissue]
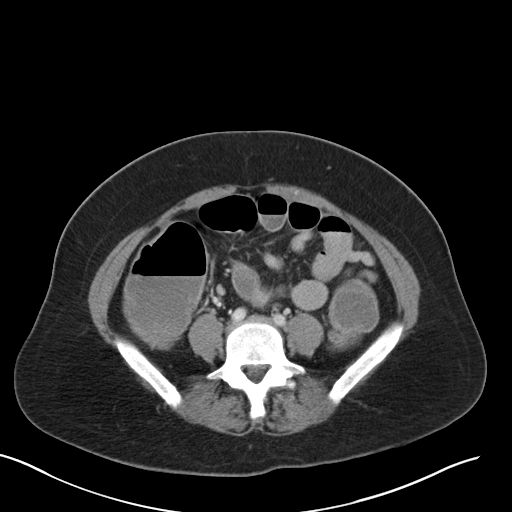
[im 55/96  soft-tissue]
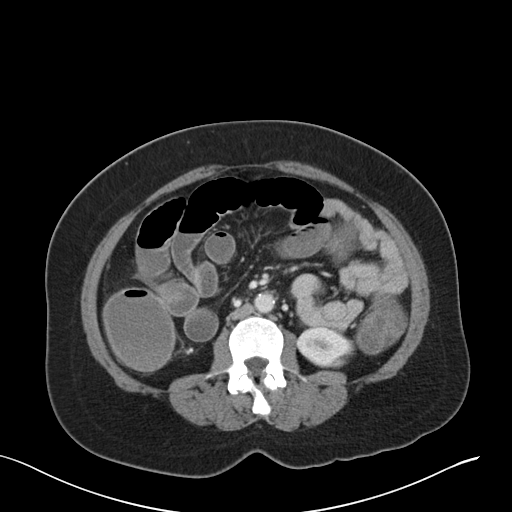
[im 62/96  soft-tissue]
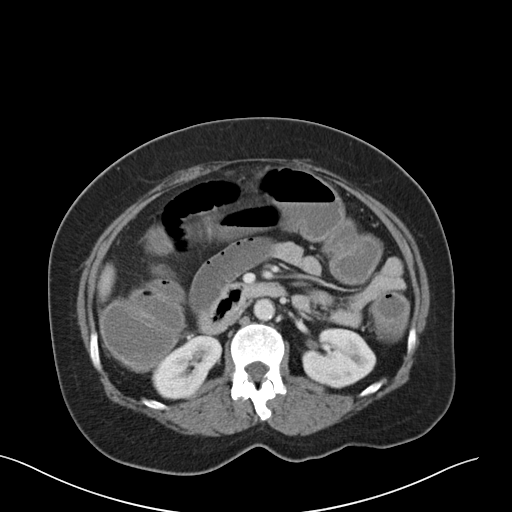
[im 68/96  soft-tissue]
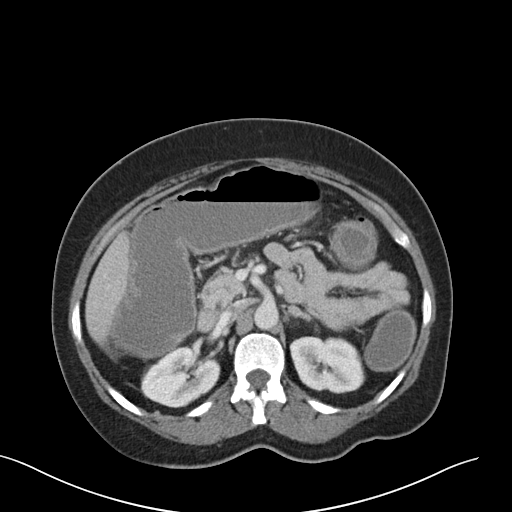
[im 68/96  bone]
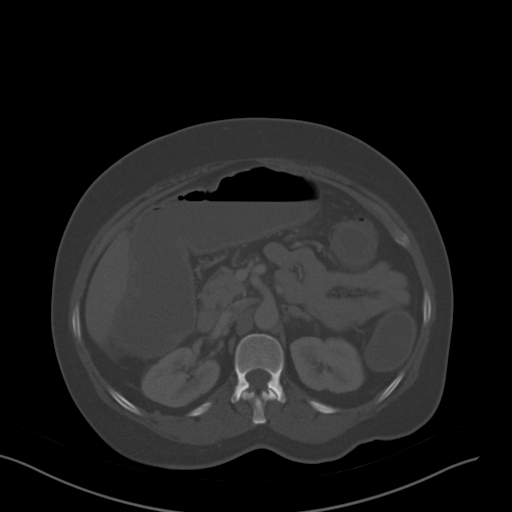
[im 75/96  soft-tissue]
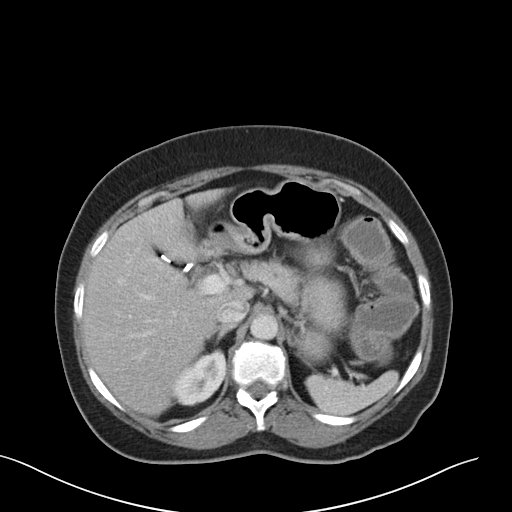
[im 82/96  soft-tissue]
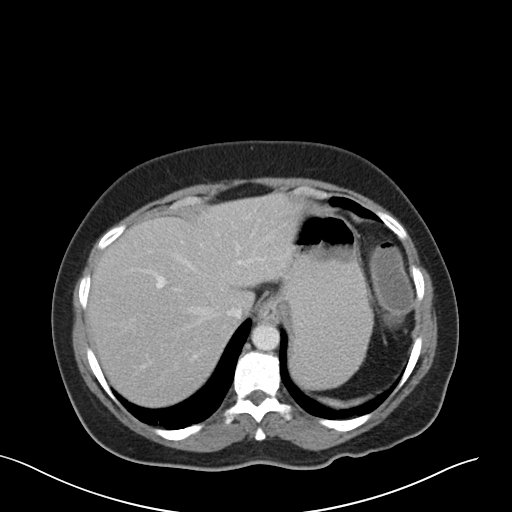
[im 89/96  soft-tissue]
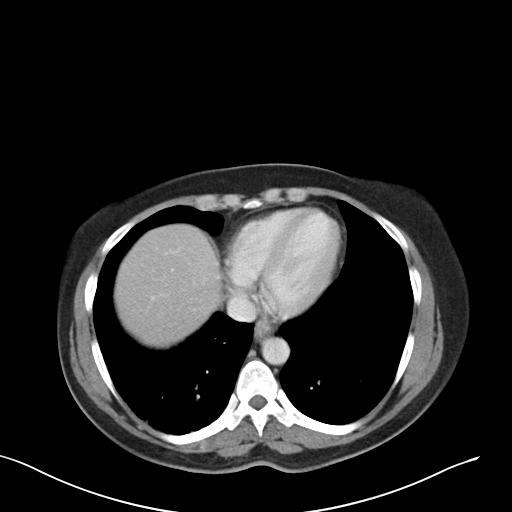

[Series 4: coronal a/|p · coronal · 0.74mm/px · 3 of 190 slices shown]
[im 64/190  soft-tissue]
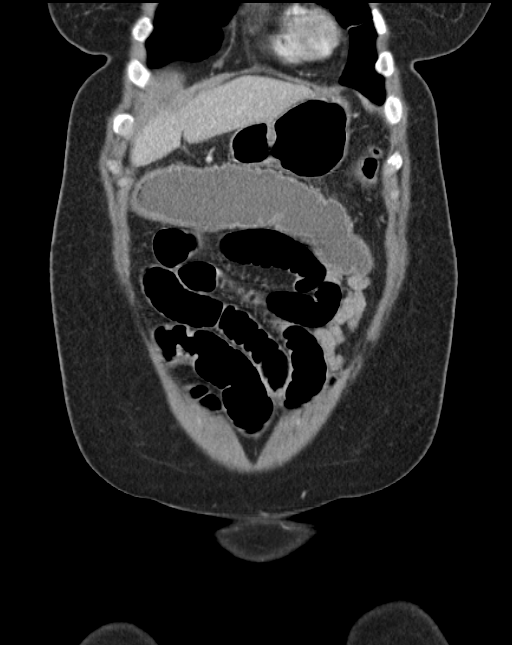
[im 85/190  soft-tissue]
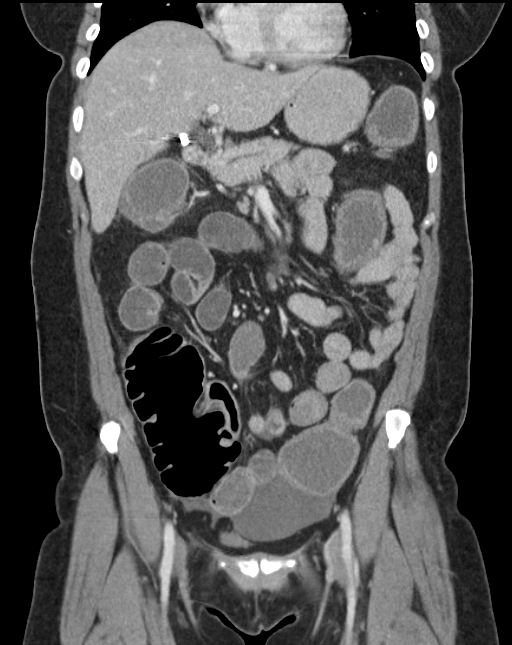
[im 106/190  soft-tissue]
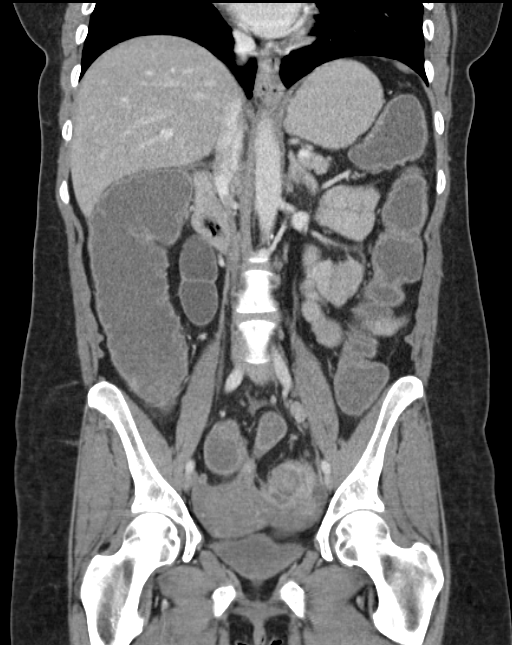

[15 of 46 positions shown; findings below may reference images not displayed]

FINDINGS: Lower chest: There is an 8 mm nodule at the right lung base (series
2, image 15) which similar the study dated 02/07/2009. The
visualized lung bases is otherwise

No intra-abdominal free air. Small pelvic ascites. Small amount of
perihepatic free fluid is also noted.

Hepatobiliary: Cholecystectomy. The liver appears unremarkable. No
intrahepatic biliary ductal dilatation.

Pancreas: Unremarkable. No pancreatic ductal dilatation or
surrounding inflammatory changes.

Spleen: Normal in size without focal abnormality.

Adrenals/Urinary Tract: Adrenal glands are unremarkable. Kidneys are
normal, without renal calculi, focal lesion, or hydronephrosis.
Bladder is unremarkable.

Stomach/Bowel: There is segmental thickening and irregularity of the
sigmoid colon. There is a 3.2 x 4.0 cm soft tissue mass involving
the sigmoid colon with associated narrowing of the lumen of the
colon. Although thickened segment of the sigmoid colon may partly
related to muscular hypertrophy related to recurrent diverticulitis,
this masslike density is highly concerning for neoplasm. Further
evaluation with colonoscopy recommended. No significant active
inflammatory changes identified. Minimal perisigmoid haziness noted
diffusely colonic fold represent mild diverticulitis. The colon is
filled with liquid content compatible with diarrheal state. There is
mild diffuse thickening of the colonic wall which may represent a
degree of colitis.

There is a small hiatal hernia. Multiple nondistended loops of small
bowel with air-fluid level likely representing a degree of ileus.
There is apparent segment of thickened loop of distal ileum in the
left hemi abdomen (series 2, image 60) which may represent layering
high attenuating content versus less likely segmental thickening of
the bowel wall. There is no definite evidence of bowel obstruction.
Normal appendix.

Vascular/Lymphatic: There is mild aortoiliac atherosclerotic
disease. The origins of the celiac axis, SMA, IMA as well as the
origins of the renal arteries are patent. No portal venous gas
identified. There is no adenopathy.

Reproductive: The uterus and ovaries are grossly unremarkable.

Other: None

Musculoskeletal: Small fat containing umbilical hernia. Abdominal
wall soft tissues appear unremarkable. The osseous structures are
intact.
IMPRESSION: Focal masslike lesion within the sigmoid colon suspicious for
neoplasm. Further evaluation with colonoscopy recommended.

There is segmental thickening of the colon, likely partly related to
chronic diverticulitis.

Diarrheal state with findings of possible mild colitis. Correlation
with clinical exam and stool cultures recommended.

Small bowel ileus. Layering bowel contents versus less likely
segmental thickening of the distal small bowel in the left
hemipelvis. No evidence of bowel obstruction. Normal appendix.

An 8 mm right lung base nodule similar to CT dated 02/07/2009

## 2019-03-11 ENCOUNTER — Telehealth: Payer: Self-pay

## 2019-03-11 NOTE — Telephone Encounter (Signed)
Patient calls nurse line regarding COVID quarantine questions. Per patient, her quarantine is finished on 2/14. However, her son is still supposed to be quarantining until 2/18. Patient also asking when she should get a repeat COVID test. Patient expresses concerns about returning to work, inquiring about work note. Informed patient that she would need to be evaluated by provider for work note.   Scheduled virtual visit with PCP tomorrow afternoon.   Veronda Prude, RN

## 2019-03-12 ENCOUNTER — Other Ambulatory Visit: Payer: Self-pay

## 2019-03-12 ENCOUNTER — Telehealth (INDEPENDENT_AMBULATORY_CARE_PROVIDER_SITE_OTHER): Payer: BC Managed Care – PPO | Admitting: Family Medicine

## 2019-03-12 DIAGNOSIS — Z7189 Other specified counseling: Secondary | ICD-10-CM | POA: Diagnosis not present

## 2019-03-12 NOTE — Assessment & Plan Note (Signed)
Per CDC guidelines patient is can discontinue quarantine if it has been 10 days since first symptoms appeared and 24 hours with no fever without the use of antipyretics and other symptoms of COVID-19 are improving, the loss of taste and smell may persist for weeks or months after recovery and need not delay the end of isolation.  Patient reports having tested positive for Covid 2 weeks ago and self quarantine will be completed on February 14.  No documented records of testing. -Advised patient that cannot medically recommend she stay off work if she is symptom-free. -Strict return precautions if symptoms worse

## 2019-03-12 NOTE — Progress Notes (Signed)
[facility] Telemedicine Visit  Patient consented to have virtual visit. Method of visit: Telephone  Encounter participants: Patient: Elizabeth Andrade - located at Home Provider: Dana Allan - located at office Others (if applicable): none  Chief Complaint: requesting information about COVID  HPI: Patient reports having tested positive for Covid 2 weeks ago.  She states  that her quarantine will be up on February 14 and her sons quarantine on the off on February to 18.  She currently is having slight runny nose and continues to have loss of smell and taste that is improving.  She is wanting to know she should be retested if she can go back to work.  She denies any shortness of breath, fevers and not taking any Tylenol.  She would like a note to stay off for another 2 weeks.  ROS: per HPI  Pertinent PMHx:   Exam:  Respiratory: Able to speak in full sentences during telephone interview.  No increased work of breathing or shortness of breath.  Assessment/Plan:  Advice given about 2019 novel coronavirus by telephone Per CDC guidelines patient is can discontinue quarantine if it has been 10 days since first symptoms appeared and 24 hours with no fever without the use of antipyretics and other symptoms of COVID-19 are improving, the loss of taste and smell may persist for weeks or months after recovery and need not delay the end of isolation.  Patient reports having tested positive for Covid 2 weeks ago and self quarantine will be completed on February 14.  No documented records of testing. -Advised patient that cannot medically recommend she stay off work if she is symptom-free. -Strict return precautions if symptoms worse    Time spent during visit with patient: 20 minutes

## 2019-03-19 ENCOUNTER — Telehealth: Payer: Self-pay

## 2019-03-19 NOTE — Telephone Encounter (Signed)
Patient calls nurse line stating she needs a note to return to work post covid. Patient stated the note needs to say, "The patients taste and smell may take months to come back, however she is no longer contagious." Patient denies any other sxs, besides loss of taste and smell. Patients quarantine was up as of February 14th. Please advise.

## 2019-03-19 NOTE — Telephone Encounter (Signed)
As long as she has been 24 hours symptom free, no fevers without having to take tylenol then she can go back to work.  Thank you Kenney Houseman

## 2019-03-22 NOTE — Telephone Encounter (Signed)
CDC Covid 19 return to work note provided to patient.

## 2019-03-27 ENCOUNTER — Ambulatory Visit: Payer: BC Managed Care – PPO

## 2019-04-12 ENCOUNTER — Other Ambulatory Visit: Payer: Self-pay | Admitting: *Deleted

## 2019-04-12 MED ORDER — METFORMIN HCL 1000 MG PO TABS: 1000.0000 mg | ORAL_TABLET | Freq: Two times a day (BID) | ORAL | 3 refills | Status: DC

## 2019-04-12 MED ORDER — GLUCOSE BLOOD VI STRP: ORAL_STRIP | 12 refills | Status: DC

## 2019-05-12 ENCOUNTER — Other Ambulatory Visit: Payer: Self-pay

## 2019-05-12 MED ORDER — GLUCOSE BLOOD VI STRP: ORAL_STRIP | 12 refills | Status: AC

## 2019-05-12 NOTE — Telephone Encounter (Signed)
Second request for Contour Next Test Strips 50S.  Will need detailed instruction as to how many times a day pt should test sugars. Sunday Spillers, CMA

## 2019-05-12 NOTE — Telephone Encounter (Signed)
Sent in under PCP name with directions of use and diagnoses code, so insurance will cover.

## 2019-05-15 NOTE — Telephone Encounter (Signed)
Thank you :)

## 2019-09-14 ENCOUNTER — Encounter (HOSPITAL_COMMUNITY): Payer: Self-pay

## 2019-09-14 ENCOUNTER — Other Ambulatory Visit: Payer: Self-pay

## 2019-09-14 ENCOUNTER — Ambulatory Visit (HOSPITAL_COMMUNITY)
Admission: EM | Admit: 2019-09-14 | Discharge: 2019-09-14 | Disposition: A | Discharge status: Home / Self Care | Payer: BC Managed Care – PPO | Attending: Urgent Care | Admitting: Urgent Care

## 2019-09-14 DIAGNOSIS — Z20822 Contact with and (suspected) exposure to covid-19: Secondary | ICD-10-CM | POA: Diagnosis not present

## 2019-09-14 DIAGNOSIS — Z7984 Long term (current) use of oral hypoglycemic drugs: Secondary | ICD-10-CM | POA: Insufficient documentation

## 2019-09-14 DIAGNOSIS — K5090 Crohn's disease, unspecified, without complications: Secondary | ICD-10-CM | POA: Diagnosis not present

## 2019-09-14 DIAGNOSIS — E119 Type 2 diabetes mellitus without complications: Secondary | ICD-10-CM | POA: Diagnosis not present

## 2019-09-14 DIAGNOSIS — R197 Diarrhea, unspecified: Secondary | ICD-10-CM | POA: Diagnosis not present

## 2019-09-14 DIAGNOSIS — F17210 Nicotine dependence, cigarettes, uncomplicated: Secondary | ICD-10-CM | POA: Diagnosis not present

## 2019-09-14 DIAGNOSIS — Z9049 Acquired absence of other specified parts of digestive tract: Secondary | ICD-10-CM | POA: Insufficient documentation

## 2019-09-14 DIAGNOSIS — Z79899 Other long term (current) drug therapy: Secondary | ICD-10-CM | POA: Diagnosis not present

## 2019-09-14 DIAGNOSIS — R110 Nausea: Secondary | ICD-10-CM | POA: Diagnosis present

## 2019-09-14 DIAGNOSIS — R519 Headache, unspecified: Secondary | ICD-10-CM | POA: Insufficient documentation

## 2019-09-14 DIAGNOSIS — R52 Pain, unspecified: Secondary | ICD-10-CM

## 2019-09-14 LAB — SARS CORONAVIRUS 2 (TAT 6-24 HRS): SARS Coronavirus 2: NEGATIVE

## 2019-09-14 MED ORDER — ONDANSETRON 4 MG PO TBDP
4.0000 mg | ORAL_TABLET | Freq: Once | ORAL | Status: AC
  Administered 2019-09-14: 4 mg via ORAL

## 2019-09-14 MED ORDER — ONDANSETRON 4 MG PO TBDP
ORAL_TABLET | ORAL | Status: AC
  Filled 2019-09-14: qty 1

## 2019-09-14 MED ORDER — ACETAMINOPHEN 325 MG PO TABS
650.0000 mg | ORAL_TABLET | Freq: Once | ORAL | Status: AC
  Administered 2019-09-14: 650 mg via ORAL

## 2019-09-14 MED ORDER — ONDANSETRON 8 MG PO TBDP: 8.0000 mg | ORAL_TABLET | Freq: Three times a day (TID) | ORAL | 0 refills | Status: DC | PRN

## 2019-09-14 MED ORDER — ACETAMINOPHEN 325 MG PO TABS
ORAL_TABLET | ORAL | Status: AC
  Filled 2019-09-14: qty 2

## 2019-09-14 MED ORDER — TRAMADOL HCL 50 MG PO TABS: 50.0000 mg | ORAL_TABLET | Freq: Four times a day (QID) | ORAL | 0 refills | Status: DC | PRN

## 2019-09-14 NOTE — Discharge Instructions (Signed)
Please schedule Tylenol at 500 mg - 650 mg once every 6 hours as needed for aches and pains.  If you still have pain despite taking Tylenol regularly, this is breakthrough pain.  You can use tramadol once every 6 hours for this.  Once your pain is better controlled, switch back to just Tylenol.  

## 2019-09-14 NOTE — ED Triage Notes (Signed)
Pt c/o N/D, body aches, and HAx5 days. Pt had neg COVID yesterday.

## 2019-09-14 NOTE — ED Provider Notes (Addendum)
[facility]   MRN: [id number] DOB: 04/01/1960  Subjective:   Elizabeth Andrade is a 60 y.o. female presenting for 2 day hx of acute onset nausea without vomiting, diarrhea, body aches, chills, decreased appetite.  Now she has had moderate to severe persistent headache.  Denies vision change, weakness, chest pain, shortness of breath, coughing, throat pain.  Patient has had COVID vaccination, last dose was in April.  Patient has a history of well-controlled diabetes, Crohn's disease.  States that her GI doctor has told her not to use any kind of NSAID.  Has used DayQuil and NyQuil without relief for her symptoms.  She does not want any injection medications.  Would like a note for her job as well.  Had a rapid COVID-19 test that was negative yesterday.   Current Facility-Administered Medications:  .  0.9 %  sodium chloride infusion, 500 mL, Intravenous, Continuous, Danis, Starr Lake III, MD  Current Outpatient Medications:  .  acetaminophen (TYLENOL) 500 MG tablet, Take 1,000 mg by mouth every 6 (six) hours as needed for mild pain., Disp: , Rfl:  .  glucose blood test strip, Use to test blood sugars 3x daily. Dx Code: E11.9, Disp: 100 each, Rfl: 12 .  loperamide (IMODIUM) 2 MG capsule, Take 1 capsule (2 mg total) by mouth 4 (four) times daily as needed for diarrhea or loose stools., Disp: 12 capsule, Rfl: 0 .  metFORMIN (GLUCOPHAGE) 1000 MG tablet, Take 1 tablet (1,000 mg total) by mouth 2 (two) times daily with a meal., Disp: 180 tablet, Rfl: 3 .  Multiple Vitamin (MULTIVITAMIN WITH MINERALS) TABS tablet, Take 1 tablet by mouth daily., Disp: , Rfl:  .  ondansetron (ZOFRAN ODT) 4 MG disintegrating tablet, Take 1 tablet (4 mg total) by mouth every 8 (eight) hours as needed for nausea or vomiting., Disp: 20 tablet, Rfl: 0 .  rosuvastatin (CRESTOR) 20 MG tablet, Take 1 tablet (20 mg total) by mouth daily., Disp: 90 tablet, Rfl: 2   No Known Allergies  Past Medical History:  Diagnosis Date    . ABNORMAL PAP SMEAR, LGSIL 02/11/2007   Qualifier: Diagnosis of  By: Sandi Mealy  MD, Judeth Cornfield    . Allergy   . Anemia   . Colitis   . Crohn's disease (HCC)   . Diabetes mellitus without complication (HCC)   . Diabetes mellitus, new onset (HCC) 02/28/2014  . Oropharyngeal candidiasis 02/28/2014     Past Surgical History:  Procedure Laterality Date  . CHOLECYSTECTOMY    . COLONOSCOPY    . FLEXIBLE SIGMOIDOSCOPY N/A 11/14/2015   Procedure: FLEXIBLE SIGMOIDOSCOPY;  Surgeon: Ruffin Frederick, MD;  Location: [facility];  Service: Gastroenterology;  Laterality: N/A;    Family History  Problem Relation Age of Onset  . Colon cancer Maternal Grandmother   . Breast cancer Other        aunt  . Depression Other   . Depression Brother        committed suicide in 9  . Diabetes Mother   . Hyperlipidemia Mother   . Hypertension Mother   . Diabetes Father   . Hyperlipidemia Father   . Hypertension Father   . Arthritis Maternal Aunt   . Esophageal cancer Neg Hx   . Rectal cancer Neg Hx   . Stomach cancer Neg Hx     Social History   Tobacco Use  . Smoking status: Current Every Day Smoker    Packs/day: 0.50    Types: Cigarettes  . Smokeless  tobacco: Never Used  Substance Use Topics  . Alcohol use: No    Alcohol/week: 0.0 standard drinks  . Drug use: No    Comment: Marijuana in the past    ROS   Objective:   Vitals: BP 136/70   Pulse 98   Temp 100.1 F (37.8 C) (Oral)   Resp 16   Ht 5\' 6"  (1.676 m)   Wt 160 lb (72.6 kg)   SpO2 100%   BMI 25.82 kg/m   Physical Exam Constitutional:      General: She is not in acute distress.    Appearance: Normal appearance. She is well-developed and normal weight. She is not ill-appearing, toxic-appearing or diaphoretic.  HENT:     Head: Normocephalic and atraumatic.     Right Ear: Tympanic membrane, ear canal and external ear normal. No drainage or tenderness. No middle ear effusion. Tympanic membrane is not erythematous.      Left Ear: Tympanic membrane, ear canal and external ear normal. No drainage or tenderness.  No middle ear effusion. Tympanic membrane is not erythematous.     Nose: Nose normal. No congestion or rhinorrhea.     Mouth/Throat:     Mouth: Mucous membranes are moist. No oral lesions.     Pharynx: Oropharynx is clear. No pharyngeal swelling, oropharyngeal exudate, posterior oropharyngeal erythema or uvula swelling.     Tonsils: No tonsillar exudate or tonsillar abscesses.  Eyes:     General: No scleral icterus.       Right eye: No discharge.        Left eye: No discharge.     Extraocular Movements: Extraocular movements intact.     Right eye: Normal extraocular motion.     Left eye: Normal extraocular motion.     Conjunctiva/sclera: Conjunctivae normal.     Pupils: Pupils are equal, round, and reactive to light.  Cardiovascular:     Rate and Rhythm: Normal rate and regular rhythm.     Pulses: Normal pulses.     Heart sounds: Normal heart sounds. No murmur heard.  No friction rub. No gallop.   Pulmonary:     Effort: Pulmonary effort is normal. No respiratory distress.     Breath sounds: Normal breath sounds. No stridor. No wheezing, rhonchi or rales.  Abdominal:     General: Bowel sounds are normal. There is no distension.     Palpations: Abdomen is soft. There is no mass.     Tenderness: There is no abdominal tenderness. There is no right CVA tenderness, left CVA tenderness, guarding or rebound.  Musculoskeletal:     Cervical back: Normal range of motion and neck supple.  Lymphadenopathy:     Cervical: No cervical adenopathy.  Skin:    General: Skin is warm and dry.     Coloration: Skin is not pale.     Findings: No rash.  Neurological:     General: No focal deficit present.     Mental Status: She is alert and oriented to person, place, and time.     Cranial Nerves: No cranial nerve deficit.     Motor: No weakness.     Coordination: Coordination normal.     Gait: Gait normal.      Deep Tendon Reflexes: Reflexes normal.  Psychiatric:        Mood and Affect: Mood normal.        Behavior: Behavior normal.        Thought Content: Thought content normal.  Judgment: Judgment normal.     Assessment and Plan :   PDMP not reviewed this encounter.  1. Severe headache   2. Body aches   3. Nausea without vomiting   4. Diarrhea, unspecified type   5. Well controlled diabetes mellitus (HCC)     Suspect viral syndrome, COVID-19, possible Crohn's flare given her GI symptoms.  Discussed management in clinic with Tylenol for her borderline fever, Zofran for nausea and vomiting.  Recommended more supportive care at home, scheduling Tylenol and using tramadol for breakthrough pain.  Push fluids.  COVID-19 testing pending. Counseled patient on potential for adverse effects with medications prescribed/recommended today, ER and return-to-clinic precautions discussed, patient verbalized understanding.     Wallis Bamberg, PA-C 09/14/19 1458

## 2020-10-16 ENCOUNTER — Other Ambulatory Visit: Payer: Self-pay | Admitting: Family Medicine

## 2021-07-03 ENCOUNTER — Encounter: Payer: Self-pay | Admitting: *Deleted

## 2021-12-03 ENCOUNTER — Ambulatory Visit (INDEPENDENT_AMBULATORY_CARE_PROVIDER_SITE_OTHER): Payer: BC Managed Care – PPO | Admitting: Student

## 2021-12-03 ENCOUNTER — Encounter: Payer: Self-pay | Admitting: Student

## 2021-12-03 VITALS — BP 128/64 | HR 88 | Wt 147.0 lb

## 2021-12-03 DIAGNOSIS — F418 Other specified anxiety disorders: Secondary | ICD-10-CM

## 2021-12-03 DIAGNOSIS — E119 Type 2 diabetes mellitus without complications: Secondary | ICD-10-CM

## 2021-12-03 LAB — POCT GLYCOSYLATED HEMOGLOBIN (HGB A1C): HbA1c, POC (controlled diabetic range): 12.1 % — AB (ref 0.0–7.0)

## 2021-12-03 MED ORDER — SERTRALINE HCL 50 MG PO TABS: 50.0000 mg | ORAL_TABLET | Freq: Every day | ORAL | 3 refills | Status: DC

## 2021-12-03 NOTE — Progress Notes (Signed)
  SUBJECTIVE:   CHIEF COMPLAINT / HPI:   Depression  Has thoughts that maybe others family members may not want her around. Has been feeling sad and depressed for years but new stressors with the family have set things off and make it so she can't handle the stress. No signs of agitation, impulsive behavior, or lack of sleep for extended periods. Feels good support with current husband and is encouraged by her grand child. Never had a plan for SI and denies any SI today. Husband of patient reports increased irritababillity and decreased appetitete. Patient reports she's sleeping a lot and often tired.  PERTINENT  PMH / PSH:    OBJECTIVE:  BP 128/64   Pulse 88   Wt 147 lb (66.7 kg)   SpO2 97%   BMI 23.73 kg/m  Physical Exam Psychiatric:        Attention and Perception: Attention and perception normal. She does not perceive auditory or visual hallucinations.        Mood and Affect: Mood is depressed. Affect is labile and tearful.        Speech: Speech normal. Speech is not rapid and pressured or delayed.        Behavior: Behavior is not agitated, slowed, aggressive, withdrawn or combative. Behavior is cooperative.        Thought Content: Thought content normal. Thought content does not include homicidal or suicidal plan.      ASSESSMENT/PLAN:  Type 2 diabetes mellitus without complication, without long-term current use of insulin ([place]) Assessment & Plan: Patient reports she has been taking her metformin intermittently and that her sugars are around 200's daily. Patient reports she has not been taking the metformin regularly, because it gives her GI issues. Patient should be started on insulin, but notes that she does not want to start insulin. Patient needing time/education to discuss daibetic care, and could not be covered at this appointment, due to more pertinent problem. Patient to f/u w/ Dr. Valentina Lucks in [place] clinic for DM management/education. A1c 12.1 today.  -f/u w/ Dr.  Valentina Lucks  Orders: -     POCT glycosylated hemoglobin (Hb A1C)  Depression with anxiety Assessment & Plan: Patient presents with depressed mood and tearful affect today, noting she has been struggling with many social stressors that are overwhelming her. She note's she's been depressed for years, but cannot manage it now. She denies and HI/SI, but reports sleep, appetite, mood disturbances. Patient open to trying medication and therapy.  -Sertraline 50 mg -F/u 2 wk -Therapy resources given -Suicide hotline given    Other orders -     Sertraline HCl; Take 1 tablet (50 mg total) by mouth daily.  Dispense: 30 tablet; Refill: 3   No follow-ups on file. Holley Bouche, MD 12/06/2021, 6:05 PM PGY-2, [place]

## 2021-12-03 NOTE — Patient Instructions (Addendum)
It was great to see you! Thank you for allowing me to participate in your care!  Our plans for today:  - We are starting you on Zoloft, take daily in the am - Make follow up appointment to be seen for depression in 2 wk - Make appointment with Dr. Janeann Forehand, Pharmacist, to discuss diabetes management - Make appointment for Check up (needing Pap smear, mammogram on f/u on diabetes) - Develop support community around you  Have people you can call or turn to, when your thoughts turn negative   Consider prayer, listening to music, talking to loved one, journaling  Suicide Hotline: 988 Call anytime  Therapy and Counseling Resources Most providers on this list will take Medicaid. Patients with commercial insurance or Medicare should contact their insurance company to get a list of in network providers.  [employer] (takes children) Location 1: [address], [place], [place] [address] Location 2: [place], [place] [address] [place] ([place] speaking therapist available)(habla espanol)(take medicare and medicaid)  [place], [place], [place] [address], [place] [email or web address] [phone]  [facility] Counseling [address] [place], [place] [address] [place], [place], [place] [address]      [phone]  [place] (spanish available) [place], [place] [address] [place] (take [facility] and medicare) [address], [place], [place] [address]       [phone]     [place] (virtual only) [phone]  [facility] [address] [place], [place]  [facility]:  [place] [phone]  [facility]:  [place] [address] [place] [phone]  [facility] (under & uninsured) [address], [place] [phone]    [email or web address]    [place] [address]  [place]    [phone]  [facility]     Phone:  [phone]     [place] [facility] [place]  [place] [address]      [place], [place] ext [place]: [place], [place], [place]   [place] ([place] therapist) [email or web address]  [place] [address]   [place] [address]    [phone]    [facility]   [address],  [place], [place]   [place]  [place]  [facility]  [address] [place], [place]        [phone]  Open Access/Walk In Clinic under & uninsured  [facility]  [address] [place], [place] Crisis [phone]  [facility] [place],  ([place])   [place], [place]: [phone]) 8:30 - 12; 1 - 2:30  [facility] the [place],  [place], [place]    ([phone]):8:30 - 12; 2 - 3PM  [facility],  [address],  [place]; [phone]):   Mon - Fri 8 AM - 5 PM  [facility] [place]  MWF 12:30 to 3:00  or call to schedule an appointment  [phone]  Specific Provider options Psychology Today  [email or web address] click on find a therapist  enter your zip code left side and select or tailor a therapist for your specific need.   [facility] Provider Directory [email or web address]  (Medicaid)   Follow all drop down to find a provider  Social Support program [facility] [place] [phone] or  Photo[employer] [address], [place], [place] Recovery support and educational   24- Hour Availability:   [facility]  [address] [place], [place] [phone] Crisis [phone]  [facility] [phone]  [place] [facility]  [phone]   [facility]  [phone] (after hours)  Therapeutic Alternative/Mobile Crisis   [phone]  [place] National Suicide Hotline  [phone] [place])  Call 911 or go to emergency room  [facility]  [phone]);  [place] and [employer]  [phone]); [place], [place], [place], [place], [place], [place], [place]   Take care and seek immediate care sooner if you develop any concerns.   Dr. Bess Kinds, MD [facility]

## 2021-12-06 NOTE — Assessment & Plan Note (Addendum)
Patient reports she has been taking her metformin intermittently and that her sugars are around 200's daily. Patient reports she has not been taking the metformin regularly, because it gives her GI issues. Patient should be started on insulin, but notes that she does not want to start insulin. Patient needing time/education to discuss daibetic care, and could not be covered at this appointment, due to more pertinent problem. Patient to f/u w/ Dr. Raymondo Band in Pharm clinic for DM management/education. A1c 12.1 today.  -f/u w/ Dr. Raymondo Band

## 2021-12-06 NOTE — Assessment & Plan Note (Signed)
Patient presents with depressed mood and tearful affect today, noting she has been struggling with many social stressors that are overwhelming her. She note's she's been depressed for years, but cannot manage it now. She denies and HI/SI, but reports sleep, appetite, mood disturbances. Patient open to trying medication and therapy.  -Sertraline 50 mg -F/u 2 wk -Therapy resources given -Suicide hotline given

## 2021-12-13 ENCOUNTER — Encounter: Payer: Self-pay | Admitting: Pharmacist

## 2021-12-13 ENCOUNTER — Ambulatory Visit (INDEPENDENT_AMBULATORY_CARE_PROVIDER_SITE_OTHER): Payer: BC Managed Care – PPO | Admitting: Pharmacist

## 2021-12-13 VITALS — Wt 144.0 lb

## 2021-12-13 DIAGNOSIS — E119 Type 2 diabetes mellitus without complications: Secondary | ICD-10-CM

## 2021-12-13 MED ORDER — INSULIN GLARGINE 100 UNIT/ML SOLOSTAR PEN: 6.0000 [IU] | PEN_INJECTOR | Freq: Every day | SUBCUTANEOUS | 0 refills | Status: DC

## 2021-12-13 NOTE — Patient Instructions (Addendum)
It was great to see you today!  Start taking Lantus (insulin glargine) 6 units once daily in the morning  Stop taking metformin  If you develop symptoms of a low blood sugar (sweaty, shaky, dizzy), check your blood sugar. If it is less than 70, eat 15 grams of fast acting carbohydrates like a half cup of regular soda or juice. Recheck your blood sugar after 15 minutes to make sure it is greater than 70. If it is still low, repeat your sugary snack.  Start checking your fasting blood sugar once daily before eating. If your fasting blood sugar is consistently in the 300s-400s by next Wednesday (11/22), call family medicine and ask to speak with Dr. Raymondo Band so we can make an insulin adjustment.

## 2021-12-13 NOTE — Assessment & Plan Note (Signed)
Diabetes longstanding currently uncontrolled based on A1c above goal >7%, with long history of A1c >12%. Pt exhibiting signs of catabolism (low body weight), making basal insulin the preferred intervention vs GLP-1 which could worsen low dietary intake and weight loss. Patient is able to verbalize appropriate hypoglycemia management plan. Medication adherence appears appropriate given side effects. Control is suboptimal due to inadequate diabetes regimen and suspected insulin insufficiency.  -Started basal insulin Lantus (insulin glargine) 6 units once daily in the AM. Provided with sample pen, and administered first dose in clinic.  -Discontinued metformin 1000 mg BID due to GI side effects. - Could consider GLP-1 or SGLT2 for additional BG control in the future pending normalization of diet and A1c lowering respectively -Instructed pt to check FBG and post-prandial BG daily and bring meter to next visit.  -Patient educated on purpose, proper use, and potential adverse effects of Lantus (insulin glargine).  -Extensively discussed pathophysiology of diabetes, recommended lifestyle interventions, dietary effects on blood sugar control.  -Counseled on s/sx of and management of hypoglycemia.

## 2021-12-13 NOTE — Progress Notes (Signed)
S:    Chief Complaint  Patient presents with   Medication Management    Diabetes   61 y.o. female who presents for diabetes evaluation, education, and management.  PMH is significant for T2DM, depression, anxiety, and Chron's colitis.  Patient was referred and last seen by Primary Care Provider, Dr. Barbaraann Faster, on 12/03/2021.  At last visit, A1c was elevated at 12.1%.   Today, patient arrives in poor spirits and presents without any assistance. She is tearful when talking about her son. She has continued to have diarrhea on metformin, even after reducing to 1 tab daily. She stopped checking her BG because it was stressful to see such high numbers. Since learning about her A1c >12%, she has severely restricted her diet and is not eating full meals during the day (choosing low carb snacks instead). Pt is hesitant to start insulin, but did take it before around the time of bowel surgery.   Current diabetes medications include: metformin 1000 mg BID (not taking d/t GI side effects) Current hypertension medications include: none Current hyperlipidemia medications include: none  Patient reports adherence to taking all medications as prescribed.   Insurance coverage: [employer]  Family/social history: Retired from ~25 years working in [employer]. Currently experiencing stress over her son who is struggling with substance use.   Patient denies hypoglycemic events.  Reported home fasting blood sugars: 200s, but recently stopped checking because she did not want to see elevated numbers.    Patient reports nocturia (nighttime urination). 1-2x night  Patient denies neuropathy (nerve pain). Patient reports visual changes. Blurry vision, needs to get eye exam  Patient reports self foot exams.   Patient reported dietary habits: Eating 0-1 meals/day. Started to use diabetic portion control (I.e. snacking on 16 pistachios and a half of an apple). Severely restricted her diet    Patient-reported exercise habits: walks up and down her stairs. Admits she could improve. Sets goal to walk 4x/week for 30 mins.   O:  Review of Systems  Eyes:  Positive for blurred vision.  All other systems reviewed and are negative.   Physical Exam Pulmonary:     Effort: Pulmonary effort is normal.  Neurological:     Mental Status: She is alert.    7 day average blood glucose: did not bring meter in   Lab Results  Component Value Date   HGBA1C 12.1 (A) 12/03/2021   There were no vitals filed for this visit.  Lipid Panel     Component Value Date/Time   CHOL 221 (H) 03/30/2018 1112   TRIG 132 03/30/2018 1112   HDL 46 03/30/2018 1112   CHOLHDL 4.8 (H) 03/30/2018 1112   LDLCALC 149 (H) 03/30/2018 1112    Clinical Atherosclerotic Cardiovascular Disease (ASCVD): No  The ASCVD Risk score (Arnett DK, et al., 2019) failed to calculate for the following reasons:   Cannot find a previous HDL lab   Cannot find a previous total cholesterol lab   A/P: Diabetes longstanding currently uncontrolled based on A1c above goal >7%, with long history of A1c >12%. Pt exhibiting signs of catabolism (low body weight), making basal insulin the preferred intervention vs GLP-1 which could worsen low dietary intake and weight loss. Patient is able to verbalize appropriate hypoglycemia management plan. Medication adherence appears appropriate given side effects. Control is suboptimal due to inadequate diabetes regimen and suspected insulin insufficiency.  -Started basal insulin Lantus (insulin glargine) 6 units once daily in the AM. Provided with  sample pen, and administered first dose in clinic.  -Discontinued metformin 1000 mg BID due to GI side effects. - Could consider GLP-1 or SGLT2 for additional BG control in the future pending normalization of diet and A1c lowering respectively -Instructed pt to check FBG and post-prandial BG daily and bring meter to next visit.  -Patient educated on  purpose, proper use, and potential adverse effects of Lantus (insulin glargine).  -Extensively discussed pathophysiology of diabetes, recommended lifestyle interventions, dietary effects on blood sugar control.  -Counseled on s/sx of and management of hypoglycemia.  -Next A1c anticipated February 2024.   Medication Samples have been provided to the patient.  Drug name: Lantus       Strength: 100u/mL        Qty: 21mL LOT: [id number]  Exp.Date: 01/28/1923  Dosing instructions: Inject Lantus 6 units once daily  The patient has been instructed regarding the correct time, dose, and frequency of taking this medication, including desired effects and most common side effects.   Elizabeth Andrade 4:34 PM 12/13/2021   ASCVD risk - primary prevention in patient with diabetes. Last LDL in 2020, not at goal of <70 mg/dL. ASCVD risk factors include T2DM. High intensity statin indicated, but pt hesitant to start additional medications today.  - Obtain lipid panel at next PCP - Consider starting rosuvastatin 20 mg daily at future visit  Written patient instructions provided. Patient verbalized understanding of treatment plan.  Total time in face to face counseling 35 minutes.    Follow-up:  Pharmacist on 12/27/21. PCP clinic visit on 12/14/21.  Patient seen with Ilene Qua, PharmD Candidate, Nils Pyle,  PharmD Candidate.and Valeda Malm, PharmD, PGY2 Pharmacy Resident.  Marland Kitchen

## 2021-12-14 ENCOUNTER — Ambulatory Visit (INDEPENDENT_AMBULATORY_CARE_PROVIDER_SITE_OTHER): Payer: BC Managed Care – PPO | Admitting: Student

## 2021-12-14 ENCOUNTER — Encounter: Payer: Self-pay | Admitting: Student

## 2021-12-14 VITALS — BP 120/52 | HR 63 | Ht 66.0 in | Wt 145.1 lb

## 2021-12-14 DIAGNOSIS — E119 Type 2 diabetes mellitus without complications: Secondary | ICD-10-CM

## 2021-12-14 DIAGNOSIS — Z0000 Encounter for general adult medical examination without abnormal findings: Secondary | ICD-10-CM

## 2021-12-14 DIAGNOSIS — Z1231 Encounter for screening mammogram for malignant neoplasm of breast: Secondary | ICD-10-CM | POA: Diagnosis not present

## 2021-12-14 DIAGNOSIS — F418 Other specified anxiety disorders: Secondary | ICD-10-CM

## 2021-12-14 NOTE — Progress Notes (Signed)
  SUBJECTIVE:   CHIEF COMPLAINT / HPI:   F/u Depression Meds: zoloft 50 mg Feels like things are getting better. Is enjoying more rest for herself.  Is also working on setting boundaries with her kids. Reports that she is sleeping better. Denies any SI/HI or Hallucinations   DM Meds: Insulin 6 units Has started the insulin and reports her vision is getting better.  CBGs: Not recording Foot Exam: Sensation allover foot Eye Exam: Looking to get one after the new year.   HLD: Lab Results  Component Value Date   CHOL 221 (H) 03/30/2018   HDL 46 03/30/2018   LDLCALC 149 (H) 03/30/2018   TRIG 132 03/30/2018   CHOLHDL 4.8 (H) 03/30/2018  Meds: none   Health Mait: Pap Smear Mammogram Flu Vaccine Covid Vaccine Colon - UTD - Next Due 12/25/25    PERTINENT  PMH / PSH: DM    OBJECTIVE:  BP (!) 120/52   Pulse 63   Ht 5\' 6"  (1.676 m)   Wt 145 lb 2 oz (65.8 kg)   SpO2 98%   BMI 23.42 kg/m  Physical Exam Constitutional:      General: She is not in acute distress.    Appearance: Normal appearance. She is not ill-appearing.  Cardiovascular:     Rate and Rhythm: Normal rate and regular rhythm.     Pulses: Normal pulses.     Heart sounds: Normal heart sounds. No murmur heard.    No friction rub. No gallop.  Pulmonary:     Effort: Pulmonary effort is normal. No respiratory distress.     Breath sounds: Normal breath sounds. No stridor. No wheezing, rhonchi or rales.  Feet:     Right foot:     Protective Sensation: 5 sites tested.  5 sites sensed.     Left foot:     Protective Sensation: 5 sites tested.  5 sites sensed.  Skin:    Capillary Refill: Capillary refill takes less than 2 seconds.  Neurological:     Mental Status: She is alert.      ASSESSMENT/PLAN:  Type 2 diabetes mellitus without complication, without long-term current use of insulin (HCC) Assessment & Plan: Patient recently saw Dr. yesterday, and is taking her insulin as prescribed. She is not  taking her sugars but was encouraged to do so. Patient to f/u in 1-2 months to see CBG log.  -F/u 1-2 months -Continue Insulin 6 units daily -Foot exam normal -Urine microalbumin ordered -Patient planning to get eye exam after christmas   Orders: -     Ambulatory referral to Ophthalmology -     Microalbumin / creatinine urine ratio; Future -     Lipid panel  Encounter for screening mammogram for malignant neoplasm of breast -     Digital Screening Mammogram, Left and Right; Future  Depression with anxiety Assessment & Plan: Reports she is feeling better, and sleeping more. Also appreciates she's setting boundaries with her kids, that used to cause a lot of stress. Denies any SI/HI or hallucinations. Will f/u 1 month -Continue Zoloft 50 mg daily -F/u 1 month -Find Therapist w/ insurance   Healthcare maintenance Assessment & Plan: Patient needing: Pap Smear, Flu, and Covid Vaccine F/u on Crohns    No follow-ups on file. Raymondo Band, MD 12/14/2021, 4:48 PM PGY-2, [facility]

## 2021-12-14 NOTE — Assessment & Plan Note (Addendum)
Reports she is feeling better, and sleeping more. Also appreciates she's setting boundaries with her kids, that used to cause a lot of stress. Denies any SI/HI or hallucinations. Will f/u 1 month -Continue Zoloft 50 mg daily -F/u 1 month -Find Therapist w/ insurance

## 2021-12-14 NOTE — Assessment & Plan Note (Signed)
Patient recently saw Dr. Raymondo Band yesterday, and is taking her insulin as prescribed. She is not taking her sugars but was encouraged to do so. Patient to f/u in 1-2 months to see CBG log.  -F/u 1-2 months -Continue Insulin 6 units daily -Foot exam normal -Urine microalbumin ordered -Patient planning to get eye exam after christmas

## 2021-12-14 NOTE — Assessment & Plan Note (Signed)
Patient needing: Pap Smear, Flu, and Covid Vaccine F/u on Crohns

## 2021-12-14 NOTE — Progress Notes (Signed)
Reveiwed: I agree with Dr. Koval's documentation and management. 

## 2021-12-14 NOTE — Patient Instructions (Addendum)
It was great to see you! Thank you for allowing me to participate in your care!  I recommend that you always bring your medications to each appointment as this makes it easy to ensure we are on the correct medications and helps [place] not miss when refills are needed.  Our plans for today:   Health Maintenance - Make an appointment for your Pap Smear, Whenever you would like. Can overlap with appointment about depression - Someone will call you about scheduling a Mammogram  - You are also due for a Flu and Covid vaccine  Depression You are doing well!  - Continue to take Zoloft, IF symptoms worsen or having issues, make appointment sooner to be seen! - Find a therapist, Call insurance to see who is in network. This will help with your depression  - Make appointment to follow up your depression in 1 month - Come to clinic when available for lab appointment to leave a sample of urine    Diabetes - Make appointment to follow up your diabetes in 2 months, will want to check your sugar logs  Log sugar daily.  Take your Fasting Sugar, first thing in the morning, before you've eaten or had any insulin Take your sugar after final meal of the day, 1-2 hours later Cholesterol - Checking your cholesterol today.    We are checking some labs today, I will call you if they are abnormal will send you a MyChart message or a letter if they are normal.  If you do not hear about your labs in the next 2 weeks please let us know.  Take care and seek immediate care sooner if you develop any concerns.   Dr. Bess Kinds, MD [facility]     For information on therapists, please go to [email or web address]. You can also contact your insurance company to find an in-network therapist.    Therapy and Counseling Resources Most providers on this list will take Medicaid. Patients with commercial insurance or Medicare should contact their insurance company to get a list of in network  providers.  [facility] (takes children) Location 1: [address] [place], [place] [address] Location 2: [address] [place], [place] [address] [phone]   [facility] (spanish speaking therapist available)(habla espanol)(take medicare and medicaid)  [address], [place], [place] [address], [email or web address] [email or web address] [phone]  [facility] [address] [facility] [address] [place] [place], [place] [address] [phone]  [facility]   [address] Suite [place], [place] [address]      [phone]  [facility] (spanish available) [address]  [place], [place] [address] [phone]  [facility] (take [facility] and medicare) [address], [address]  [place], [place] [address]       [phone]     [facility] (virtual only) [phone]  [facility] [address] [place], [place] [phone]  [facility]:  [address] [place] [phone]  [facility]:  [address] [place] [phone]  [facility] (under & uninsured) [address]   [place] [phone]    [email or web address]    [facility] [address]  [place]    [phone]  [facility] -[address] [place] [address]     Phone:  [phone]     [facility]-  [address]  [phone]  [facility] [address] [place], [place] [phone]  [facility]: [address] [place], [place], [place]  [phone]   [facility] (Spanish therapist) [email or web address]  [address] [place]  [address]   [place] [address]    [phone]    [facility]   [address],  [place], [place]  [phone]   [facility]  [address] [place]   [phone]  [facility]  [address] [place], [place]        [phone]  Open Access/Walk In Clinic under & uninsured  [facility]  [address] [place], [place] [phone] Crisis [phone]  [facility] [place],  (Spanish)   [address], [place]: [phone]) 8:30 - 12; 1 - 2:30  [facility] [employer],  [address] [place], [place]    [phone]):8:30 - 12; 2 - 3PM  [facility],  [address]  [place]; [phone]):   Mon - Fri 8 AM - 5 PM  [facility] [address] [place]  MWF 12:30 to 3:00 or call to schedule an appointment  [phone]  Specific Provider options Psychology Today  [email or web address] click on find a therapist  enter your zip code left side and select or tailor a therapist for your specific need.   [facility] Provider Directory [email or web address]  (Medicaid)   Follow all drop down to find a provider  Social Support program [facility] [place] [phone] or PhotoSolver.pl [address], [place], [place] Recovery support and educational   24- Hour Availability:   [facility]  [address] [place], [place] [phone] Crisis [phone]  [facility] [phone]  [place] [facility]  [phone]   [facility]  [phone] (after hours)  Therapeutic Alternative/Mobile Crisis   [phone]  [place] National Suicide Hotline  [phone] Len Childs)  Call 911 or go to emergency room  [facility]  [phone]);  [place] and [employer]  [phone]); [place], [place], [place], [place], [place], [place], [place]

## 2021-12-16 LAB — LIPID PANEL
Chol/HDL Ratio: 5.5 ratio — ABNORMAL HIGH (ref 0.0–4.4)
Cholesterol, Total: 160 mg/dL (ref 100–199)
HDL: 29 mg/dL — ABNORMAL LOW (ref >39)
LDL Chol Calc (NIH): 100 mg/dL — ABNORMAL HIGH (ref 0–99)
Triglycerides: 174 mg/dL — ABNORMAL HIGH (ref 0–149)
VLDL Cholesterol Cal: 31 mg/dL (ref 5–40)

## 2021-12-16 LAB — MICROALBUMIN / CREATININE URINE RATIO
Creatinine, Urine: 348 mg/dL
Microalb/Creat Ratio: 12 mg/g creat (ref 0–29)
Microalbumin, Urine: 40.6 ug/mL

## 2021-12-26 ENCOUNTER — Telehealth: Payer: Self-pay | Admitting: Student

## 2021-12-26 ENCOUNTER — Other Ambulatory Visit: Payer: Self-pay | Admitting: Student

## 2021-12-26 DIAGNOSIS — E119 Type 2 diabetes mellitus without complications: Secondary | ICD-10-CM

## 2021-12-26 MED ORDER — INSULIN PEN NEEDLE 29G X 12MM MISC: 2 refills | Status: DC

## 2021-12-26 MED ORDER — ROSUVASTATIN CALCIUM 5 MG PO TABS: 5.0000 mg | ORAL_TABLET | Freq: Every day | ORAL | 1 refills | Status: DC

## 2021-12-26 MED ORDER — INSULIN GLARGINE 100 UNIT/ML SOLOSTAR PEN: 6.0000 [IU] | PEN_INJECTOR | Freq: Every day | SUBCUTANEOUS | 2 refills | Status: DC

## 2021-12-26 NOTE — Telephone Encounter (Signed)
Called to inform patient about cholesterol panel and that we will start cholesterol medicine. Patient to be started on Crestor 5 mg daily. Patient also requested refill of insulin and needles.

## 2021-12-27 ENCOUNTER — Ambulatory Visit (INDEPENDENT_AMBULATORY_CARE_PROVIDER_SITE_OTHER): Payer: BC Managed Care – PPO | Admitting: Pharmacist

## 2021-12-27 ENCOUNTER — Encounter: Payer: Self-pay | Admitting: Pharmacist

## 2021-12-27 VITALS — BP 118/51 | HR 64 | Wt 146.4 lb

## 2021-12-27 DIAGNOSIS — E119 Type 2 diabetes mellitus without complications: Secondary | ICD-10-CM

## 2021-12-27 MED ORDER — INSULIN GLARGINE 100 UNIT/ML SOLOSTAR PEN: 8.0000 [IU] | PEN_INJECTOR | Freq: Every day | SUBCUTANEOUS | 2 refills | Status: DC

## 2021-12-27 NOTE — Patient Instructions (Addendum)
It was great to see you today!  Increase your Lantus (insulin glargine) to 8 units daily. If your fasting blood sugars are still higher than 140 mg/dL in one week, increase your Lantus (insulin glargine) to 10 units daily.  Look out for symptoms of hypoglycemia. If your blood sugar is less than 70 mg/dL, have 15 grams of fast acting carbohydrates like a half cup of regular soda or jucie.  Pick up rosuvastatin 5 mg from the pharmacy and start taking once daily.  Continue sertraline 50 mg daily.   Please reach out if you have any questions or concerns.

## 2021-12-27 NOTE — Progress Notes (Signed)
S:    Chief Complaint  Patient presents with   Medication Management    DM f/u   61 y.o. female who presents for diabetes evaluation, education, and management.  PMH is significant for T2DM, depression, anxiety, and Chron's colitis.  Patient was referred and last seen by Primary Care Provider, Dr. Marcina Millard, on 12/14/2021. A1c on 11/6 was elevated at 12.1%. Last seen in Rx clinic on 12/13/21 where basal insulin glargine (Lantus) was initiated at 6 units once daily and metformin was discontinued.    Today, patient arrives in much improved spirits without any assistance. She reports no issues starting insulin, denies hypoglycemia. Checking FBG daily. Had 4 days in the 100s- 169, 178. Mostly FBG in the 200s, highest she remembers is 240. She reports that her mood and energy levels continue to improve daily. Does not feel that she needs to increase her sertraline dose at this time. She has begun eating more frequently, but is still paying attention to carbohydrate count (ex: purchased mini bags of pretzels, each serving is 12 g carbohydrates). She wants to continue to try to increase her vegetable intake, along with physical activity.   Dr. Marcina Millard called her yesterday to review results of her lipid panel. LDL-C was elevated to 100 mg/dL. Prescribed rosuvastatin 5 mg daily, though pt has not yet picked up. She is weary of the fact that she has gone from taking 0 medications to 3 prescription medications.  Current diabetes medications include: insulin glargine (Lantus) 6 units daily Current hypertension medications include: none Current hyperlipidemia medications include: none  Patient reports adherence to taking all medications as prescribed.   Insurance coverage: [place]  Family/social history: Retired from ~25 years working in [employer] Currently experiencing stress over her son who is struggling with substance use.   Patient denies hypoglycemic events.  Reported  home fasting blood sugars: Mostly 200s. Remembers 4 readings in the upper 100s (>170 mg/dL) in the past two weeks. Did not bring meter or log today.  Patient reports nocturia (nighttime urination), improved to 1x/night Patient denies neuropathy (nerve pain). Patient reports visual changes. Blurry vision, needs to get eye exam. Same as previous. Patient reports self foot exams.   Patient reported dietary habits: Eating 2 meals/day (previous 0-1). Trying to utilize diabetic portion control. Has diet handout from last visit.   Patient-reported exercise habits: walks up and down her stairs a few times/day. Admits she could improve. Sets goal to walk 4x/week for 30 mins.   O:  Review of Systems  Eyes:  Positive for blurred vision.  All other systems reviewed and are negative.   Physical Exam Pulmonary:     Effort: Pulmonary effort is normal.  Neurological:     Mental Status: She is alert.    7 day average blood glucose: did not bring meter in   Lab Results  Component Value Date   HGBA1C 12.1 (A) 12/03/2021   Vitals:   12/27/21 1043  BP: (!) 118/51  Pulse: 64  SpO2: 100%    Lipid Panel     Component Value Date/Time   CHOL 160 12/14/2021 1646   TRIG 174 (H) 12/14/2021 1646   HDL 29 (L) 12/14/2021 1646   CHOLHDL 5.5 (H) 12/14/2021 1646   LDLCALC 100 (H) 12/14/2021 1646    Clinical Atherosclerotic Cardiovascular Disease (ASCVD): No  The 10-year ASCVD risk score (Arnett DK, et al., 2019) is: 22.2%   Values used to calculate the score:  Age: 42 years     Sex: Female     Is Non-Hispanic African American: Yes     Diabetic: Yes     Tobacco smoker: Yes     Systolic Blood Pressure: 118 mmHg     Is BP treated: No     HDL Cholesterol: 29 mg/dL     Total Cholesterol: 160 mg/dL   A/P: Diabetes longstanding currently uncontrolled based on A1c above goal >7%, with long history of A1c >12%. Per pt report, BG and symptoms are improved since starting basal insulin. Pt has  gained a few pounds and would like to maintain her current weight. Favor continued titration of insulin vs GLP-1 which could worsen low dietary intake at this time. Patient is able to verbalize appropriate hypoglycemia management plan. Medication adherence appears appropriate given side effects. Control remains suboptimal due to inadequate insulin dose - Increased basal insulin Lantus (insulin glargine) to 8 units once daily in the AM. If FBG remains >140 mg/dL in one week, increase to 10 units once daily. - Could consider GLP-1 or SGLT2 for additional BG control in the future pending normalization of diet and A1c lowering respectively -Instructed pt to check FBG and post-prandial BG daily and bring meter to next visit.  -Patient educated on purpose, proper use, and potential adverse effects of increasing Lantus (insulin glargine).  -Extensively discussed pathophysiology of diabetes, recommended lifestyle interventions, dietary effects on blood sugar control.  -Counseled on s/sx of and management of hypoglycemia.  -Next A1c anticipated February 2024.   ASCVD risk - primary prevention in patient with diabetes. LDL-C of 100 mg/dL uncontrolled above goal <70 mg/dL. High intensity statin indicated, but pt hesitant to start medications and low dose rosuvastatin was prescribed yesterday. Will revisit statin intensification in the future. Discussed benefits of statin therapy, especially in T2DM, and encouraged pt to pick up medication. - Continue rosuvastatin 5 mg daily  Written patient instructions provided. Patient verbalized understanding of treatment plan.  Total time in face to face counseling 25 minutes.    Follow-up:  PCP clinic visit on 02/12/2022.  Rx clinic f/u: determine at next PCP visit  Patient seen with Nils Pyle,  PharmD Candidate and Valeda Malm, PharmD, PGY2 Pharmacy Resident.

## 2021-12-27 NOTE — Assessment & Plan Note (Signed)
Diabetes longstanding currently uncontrolled based on A1c above goal >7%, with long history of A1c >12%. Per pt report, BG and symptoms are improved since starting basal insulin. Pt has gained a few pounds and would like to maintain her current weight. Favor continued titration of insulin vs GLP-1 which could worsen low dietary intake at this time. Patient is able to verbalize appropriate hypoglycemia management plan. Medication adherence appears appropriate given side effects. Control remains suboptimal due to inadequate insulin dose - Increased basal insulin Lantus (insulin glargine) to 8 units once daily in the AM. If FBG remains >140 mg/dL in one week, increase to 10 units once daily. - Could consider GLP-1 or SGLT2 for additional BG control in the future pending normalization of diet and A1c lowering respectively -Instructed pt to check FBG and post-prandial BG daily and bring meter to next visit.  -Patient educated on purpose, proper use, and potential adverse effects of increasing Lantus (insulin glargine).  -Extensively discussed pathophysiology of diabetes, recommended lifestyle interventions, dietary effects on blood sugar control.  -Counseled on s/sx of and management of hypoglycemia.

## 2021-12-27 NOTE — Progress Notes (Signed)
Reviewed: I agree with Dr. Koval's documentation and management. 

## 2021-12-28 ENCOUNTER — Telehealth: Payer: Self-pay

## 2021-12-28 MED ORDER — BASAGLAR KWIKPEN 100 UNIT/ML ~~LOC~~ SOPN: 8.0000 [IU] | PEN_INJECTOR | SUBCUTANEOUS | 11 refills | Status: DC

## 2021-12-28 NOTE — Telephone Encounter (Signed)
Patient calls nurse line in regards to Lantus.  She reports her insurance does not cover this.  I spoke with the pharmacy and suggested alternatives include Levemir or Basaglar.   Will forward to ordering provider.

## 2021-12-28 NOTE — Telephone Encounter (Signed)
Basaglar in place of Lantus per formulary request from pharmacy.   New prescription provided.

## 2021-12-31 NOTE — Telephone Encounter (Signed)
Noted and agree. 

## 2022-02-12 ENCOUNTER — Ambulatory Visit: Payer: BC Managed Care – PPO | Admitting: Student

## 2022-02-18 ENCOUNTER — Telehealth: Payer: Self-pay | Admitting: Student

## 2022-02-18 ENCOUNTER — Telehealth: Payer: Self-pay

## 2022-02-18 NOTE — Telephone Encounter (Signed)
Patient calls nurse line stating that Elizabeth Andrade is no longer covered by insurance. Insurance preferred medications are Lantus or [place].   If appropriate, please place new order for insurance preferred medication.   Talbot Grumbling, RN

## 2022-02-18 NOTE — Telephone Encounter (Signed)
Patient walked in to request refill of:  Name of Medication(s):  Lantus or Tyler Aas  Last date of OV:  12/14/21 Pharmacy:  same  Patient stated her insurance will not cover the previous prescription. So she need one of the listed prescription.  Totally out of medication.   Will route refill request to Clinic RN.  Discussed with patient policy to call pharmacy for future refills.  Also, discussed refills may take up to 48 hours to approve or deny.  Creig Hines

## 2022-02-18 NOTE — Telephone Encounter (Signed)
Patient walked in to request refill of:  Name of Medication(s):  Lantus or T[place]iba  Last date of OV:  12/14/21 Pharmacy:  same  Patient stated her insurance will not cover the previous prescription. So she need one of the listed prescription.  Totally out of medication.   Will route refill request to Clinic RN.  Discussed with patient policy to call pharmacy for future refills.  Also, discussed refills may take up to 48 hours to approve or deny.  Elizabeth Andrade  

## 2022-02-20 ENCOUNTER — Other Ambulatory Visit: Payer: Self-pay | Admitting: Student

## 2022-02-20 MED ORDER — INSULIN GLARGINE 100 UNIT/ML ~~LOC~~ SOLN: 8.0000 [IU] | SUBCUTANEOUS | 1 refills | Status: DC

## 2022-02-20 NOTE — Telephone Encounter (Signed)
Called to inform patient about med switch.  Patient needing to switch to lantus from glargine given insurance no longer accepting glargine. Patient switched to lantus w/ starting dose of 8 units.   Patient to make an appointment to be seen in 1-2 wks to be sure new insulin is working.   Patient CBG goal 100-130, patient not to take insulin if CBG less than 100.

## 2022-02-20 NOTE — Progress Notes (Signed)
Patient needing to switch to lantus from glargine given insurance no longer accepting glargine. Patient switched to starting dose of 8 units.   Patient to make an appointment to be seen in 1-2 wks to be sure new insulin is working.   Patient CBG goal 100-130, patient not to take insulin if CBG less than 100.

## 2022-02-26 ENCOUNTER — Encounter: Payer: Self-pay | Admitting: Student

## 2022-02-26 ENCOUNTER — Ambulatory Visit (INDEPENDENT_AMBULATORY_CARE_PROVIDER_SITE_OTHER): Payer: BC Managed Care – PPO | Admitting: Student

## 2022-02-26 VITALS — BP 132/60 | HR 60 | Ht 66.0 in | Wt 157.4 lb

## 2022-02-26 DIAGNOSIS — H9312 Tinnitus, left ear: Secondary | ICD-10-CM | POA: Diagnosis not present

## 2022-02-26 DIAGNOSIS — Z23 Encounter for immunization: Secondary | ICD-10-CM | POA: Diagnosis not present

## 2022-02-26 DIAGNOSIS — E119 Type 2 diabetes mellitus without complications: Secondary | ICD-10-CM

## 2022-02-26 DIAGNOSIS — F418 Other specified anxiety disorders: Secondary | ICD-10-CM | POA: Diagnosis not present

## 2022-02-26 NOTE — Progress Notes (Unsigned)
  SUBJECTIVE:   CHIEF COMPLAINT / HPI:   Papsmear and DM  DM FBG: range mostly 150-180's Meds: 10 units glargine Foot exam: done previously Urine: Can't give sample today   Depression: Dong better, still taking meds. Setting boundaries w/ family. Has therapist with [facility]. Sleeping a little better and has better appetite.    Hearing noises: Has been hearing a noise in ear that sounds like static on a TV. Has been going on for about a year. Sometimes she doesn't hear it when she is busy and doing things. No dizziness or LOC w/ symptoms. Nothing makes it better or worse. Sometimes it gets louder when she get's upset, but is usually present in the background. Otherwise feeling healthy and well, no balance issues/vision issues  Vacc: Covid, DTap   PERTINENT  PMH / PSH: ***  Past Medical History:  Diagnosis Date   ABNORMAL PAP SMEAR, LGSIL 02/11/2007   Qualifier: Diagnosis of  By: Carlena Sax  MD, Colletta Maryland     Allergy    Anemia    Colitis    Crohn's disease ([place])    Diabetes mellitus without complication ([place])    Diabetes mellitus, new onset ([place]) 02/28/2014   Oropharyngeal candidiasis 02/28/2014    OBJECTIVE:  There were no vitals taken for this visit. Physical Exam   ASSESSMENT/PLAN:  There are no diagnoses linked to this encounter. No follow-ups on file. Holley Bouche, MD 02/26/2022, 7:35 AM PGY-***, [facility] {    This will disappear when note is signed, click to select method of visit    :1}

## 2022-02-26 NOTE — Patient Instructions (Addendum)
It was great to see you! Thank you for allowing me to participate in your care!  I recommend that you always bring your medications to each appointment as this makes it easy to ensure we are on the correct medications and helps [place] not miss when refills are needed.  Our plans for today:  - Pap smear  Schedule an appointment to have this done at your convenience  -Tinnitus  Referral to audiology for hearing testing Can use white noise machine on phone to listen to other sounds to help deal with the noise (rain, white noise, brown noise, airplane sounds, thunder clouds, ect...)  - Diabetes  Increase insulin to 12 units daily  Make appointment to be seen at end of February for DM check  -Depression  Continue to take meds, see therapist when you have appointment     Take care and seek immediate care sooner if you develop any concerns.   Dr. Holley Bouche, MD [place]

## 2022-02-27 DIAGNOSIS — H9312 Tinnitus, left ear: Secondary | ICD-10-CM | POA: Insufficient documentation

## 2022-02-27 NOTE — Assessment & Plan Note (Signed)
Patient notes static sound in left ear for going on a year. She denies any changes in hearing, and usually hears it in the background. She doesn't notice it when she's active/busy, but does notice it when she's quite or her heart beat is up. She denies any LOC, HA, changes in vision or hearing. Neuro exam was normal. Patient appears to suffering from tinnitus, in one ear. As this is a concerning feature, will send to audiology for testing. Concern for sensineural hearing loss/possible acoustic neuroma.  -Ref to Audiology

## 2022-02-27 NOTE — Assessment & Plan Note (Signed)
Patient DM is better managed w/ Fasting CBGs ranging from 150-180's. Patient could use a little change in insulin dose and is due for A1c in 1 month. Will increase insulin to 12 units.  -12 units glargine -F/u 1 month for A1c

## 2022-02-27 NOTE — Assessment & Plan Note (Signed)
Patient report depression is improving. Sleep is a little better and she has a better appetite. She's been setting boundaries w/ her family and plans to get a therapist w/ [facility]. She is still taking her zoloft.  -Continue zoloft 50 mg -See therapist -F/u 1 month

## 2022-02-28 ENCOUNTER — Other Ambulatory Visit (HOSPITAL_COMMUNITY): Payer: Self-pay

## 2022-03-05 ENCOUNTER — Ambulatory Visit
Admission: RE | Admit: 2022-03-05 | Discharge: 2022-03-05 | Disposition: A | Discharge status: Home / Self Care | Payer: BC Managed Care – PPO | Source: Ambulatory Visit | Attending: Family Medicine | Admitting: Family Medicine

## 2022-03-05 DIAGNOSIS — Z1231 Encounter for screening mammogram for malignant neoplasm of breast: Secondary | ICD-10-CM

## 2022-03-07 ENCOUNTER — Ambulatory Visit: Payer: BC Managed Care – PPO | Attending: Audiologist | Admitting: Audiologist

## 2022-03-07 DIAGNOSIS — H9312 Tinnitus, left ear: Secondary | ICD-10-CM | POA: Diagnosis present

## 2022-03-07 DIAGNOSIS — H903 Sensorineural hearing loss, bilateral: Secondary | ICD-10-CM | POA: Insufficient documentation

## 2022-03-07 NOTE — Procedures (Signed)
  Outpatient Audiology and [place], [place]  [id number] [phone]  AUDIOLOGICAL  EVALUATION  NAME: JANESIA JOSWICK     DOB:   July 17, 1960      MRN: [id number]                                                                                     DATE: 03/07/2022     REFERENT: Holley Bouche, MD STATUS: Outpatient DIAGNOSIS: Tinnitus, Sensorineural Hearing Loss Bilateral    History: Bibiana was seen for an audiological evaluation.  Clairessa is receiving a hearing evaluation due to concerns for static sounds. The static sound feels like its in her head, closer to the left. She can cope with in during the day by staying busy, but struggles at night. The static gets much louder when she is upset. Aayliah has no difficulty hearing. This tinnitus began gradually and has been getting worse over the last year. No pain or pressure reported in either ear.  Nyia has no noise exposure history.  Medical history positive for diabetes which is a risk factor for hearing loss and tinnitus. No other relevant case history reported.   Evaluation:  Otoscopy showed a clear view of the tympanic membranes, bilaterally Tympanometry results were consistent with normal middle ear function, bilaterally   Audiometric testing was completed using conventional audiometry with supraural and insert transducer. Speech Recognition Thresholds were 35dB in the right ear and 30dB in the left ear. Word Recognition was performed 40dB SL, scored 100% in the right ear and 100% in the left ear. Pure tone thresholds show mild to moderate sloping sensorineural hearing loss in each ear. Slight asymmetry present at 3-4kHz with the left ear 10-15dB worse. Tinnitus pitch and loudness matched to a 3kHz NBN at 55-60dB.    Results:  The test results were reviewed with [facility]. Shaddai has normal hearing sloping to a moderate sensorineural hearing loss bilaterally. The tinnitus is matched to a high pitched tone.  Masking strategies and possible benefit of hearing aids discussed at length. Jelicia would rather use masking and wait to use hearing aids.   Recommendations: Recommend annual audiology testing due to slight asymmetry and tinnitus. Recommend referral to Otolaryngology due to asymmetric tinnitus and slight asymmetry in hearing.  Patient given handout on masking options to improve sleep and lessen tinnitus awareness. Patient does not want to wear anything in the ears. She was given a list of Sound Pillows that emit masking noise.  Reduce stress and be aware that tinnitus will increase in volume as fight or flight response is triggered. Use deep breathing and calming music or sound to lessen awareness.  Try hearing aids to stimulate area of hearing that is currently experiencing mild hearing loss. See handout for explanation or role of hearing loss in tinnitus.    43 minutes spent testing and counseling on results.   Alfonse Alpers  Audiologist, Au.D., CCC-A 03/07/2022  4:21 PM  Cc: Holley Bouche, MD

## 2022-03-11 ENCOUNTER — Ambulatory Visit: Payer: BC Managed Care – PPO | Admitting: Behavioral Health

## 2022-03-11 NOTE — Progress Notes (Unsigned)
                Quenisha Lovins L Rihanna Marseille, LMFT 

## 2022-03-25 ENCOUNTER — Ambulatory Visit: Payer: BC Managed Care – PPO | Admitting: Behavioral Health

## 2022-03-26 ENCOUNTER — Encounter: Payer: Self-pay | Admitting: Student

## 2022-03-26 ENCOUNTER — Ambulatory Visit (INDEPENDENT_AMBULATORY_CARE_PROVIDER_SITE_OTHER): Payer: BC Managed Care – PPO | Admitting: Student

## 2022-03-26 VITALS — BP 136/63 | HR 68 | Ht 66.0 in | Wt 160.0 lb

## 2022-03-26 DIAGNOSIS — F418 Other specified anxiety disorders: Secondary | ICD-10-CM

## 2022-03-26 DIAGNOSIS — E119 Type 2 diabetes mellitus without complications: Secondary | ICD-10-CM | POA: Diagnosis not present

## 2022-03-26 DIAGNOSIS — H9312 Tinnitus, left ear: Secondary | ICD-10-CM

## 2022-03-26 LAB — POCT GLYCOSYLATED HEMOGLOBIN (HGB A1C): HbA1c, POC (controlled diabetic range): 7.2 % — AB (ref 0.0–7.0)

## 2022-03-26 MED ORDER — ROSUVASTATIN CALCIUM 20 MG PO TABS: 20.0000 mg | ORAL_TABLET | Freq: Every day | ORAL | 2 refills | Status: DC

## 2022-03-26 MED ORDER — SERTRALINE HCL 100 MG PO TABS: 100.0000 mg | ORAL_TABLET | Freq: Every day | ORAL | 2 refills | Status: DC

## 2022-03-26 MED ORDER — DAPAGLIFLOZIN PROPANEDIOL 5 MG PO TABS: 5.0000 mg | ORAL_TABLET | Freq: Every day | ORAL | 3 refills | Status: DC

## 2022-03-26 NOTE — Patient Instructions (Addendum)
It was great to see you! Thank you for allowing me to participate in your care!  I recommend that you always bring your medications to each appointment as this makes it easy to ensure we are on the correct medications and helps [place] not miss when refills are needed.  Our plans for today:  - Tinnitus  Referral to Ear Nose and Throat doctors  -Depression Increasing dose of Zoloft to 100 mg daily Start seeing therapist, virtually if only time available and plan to see in person as well Follow up in 1 month  -Diabetes A1c in 7.2, your nearly at goal and this is a great number. We ideally want you under 7. Will start Farxiga 5 mg daily: Stop taking if you become sick and restart after illness resolves Continue Insulin at 12 units daily Will collect urine studies at next appointment Foot Exam was completely normal BMP today to check kidney function  Cholesterol  Changing dose of cholesterol medicine from 5 mg to 20 mg. You will take 20 mg daily  Can take 4 of the 5 mg pills to equal a dose of 20 mg daily, until you complete the 5 mg pills  Next Appointment:  Schedule a follow up appointment for a PAP Smear and urine studies to happen in 1 month or so     Take care and seek immediate care sooner if you develop any concerns.   Dr. Holley Bouche, MD [place]

## 2022-03-26 NOTE — Progress Notes (Signed)
SUBJECTIVE:   CHIEF COMPLAINT / HPI:   Depression Last seen 1/30 noting depression improving (sleep, appetite, mood). Also was planning to get therapist with .  Today: Feels good most of the day, but some days are worse than others and she notices that she needs more help at night. Report's when she get's home or early in the morning that her thoughts are a little overwhelming. Mood overall improved but is sometimes low in the evening. Sleep is improved and appetite is a little higher causing her to eat more than she likes. Patient denies any SI/HI at this time.   Has a therapist but she canceled her appt and she missed the first. But she is willing to see therapist virtually, and hopefully 2-4x a month.  Meds: zoloft 50 mg  Tinnnits Seen by audiology on 2/8 shown to have moderate sensineural hearing loss bilaterally and w/ asymmetric tinnitus (left ear). Recommendation for referral to ENT. Today: Report's it's doing about the same. Notices it when it get's qiuet, but is otherwise un bothered by it. Open to seeing ENT.   DM Last seen 02/26/22, reported CBG range 140-180.  Meds: 12 units glargine, Crestor 5 mg Today: CBG range 140's-150's.  Foot exam:   *Change statin* Foot exam due Urine studies  Health Mait: Pap Smear  PERTINENT  PMH / PSH: Depression, HlD, HTN  Past Medical History:  Diagnosis Date   ABNORMAL PAP SMEAR, LGSIL 02/11/2007   Qualifier: Diagnosis of  By: Carlena Sax  MD, Colletta [place]     Allergy    Anemia    Colitis    Crohn's disease ([place])    Diabetes mellitus without complication ([place])    Diabetes mellitus, new onset ([place]) 02/28/2014   Oropharyngeal candidiasis 02/28/2014    OBJECTIVE:  BP 136/63   Pulse 68   Ht '5\' 6"'$  (1.676 m)   Wt 160 lb (72.6 kg)   SpO2 97%   BMI 25.82 kg/m  Physical Exam Constitutional:      General: She is not in acute distress.    Appearance: Normal appearance. She is normal weight. She is not ill-appearing.  Cardiovascular:      Rate and Rhythm: Normal rate and regular rhythm.     Pulses: Normal pulses.     Heart sounds: Normal heart sounds. No murmur heard.    No friction rub. No gallop.  Pulmonary:     Effort: Pulmonary effort is normal. No respiratory distress.     Breath sounds: Normal breath sounds. No wheezing or rhonchi.  Abdominal:     General: Bowel sounds are normal. There is no distension.     Palpations: Abdomen is soft.     Tenderness: There is no abdominal tenderness.  Neurological:     Mental Status: She is alert.  Psychiatric:        Mood and Affect: Mood normal.        Behavior: Behavior normal.        Thought Content: Thought content normal.      ASSESSMENT/PLAN:  Tinnitus of left ear -     Ambulatory referral to ENT  Type 2 diabetes mellitus without complication, without long-term current use of insulin (HCC) Assessment & Plan: Patient reports good compliance w/ insulin, fasting sugars around 140's-150's daily. Currently taking 12 units of glargine. A1c today 7.2, nearly at goal. Patient would benefit from glycemic control w/ SGLT2, will start [place] today and follow up in 3 months. Foot exam normal today. Patient on statin but low  dose. Will increase dose to high intensity statin.  -Farxiga 5 mg daily -Insulin 12 units daily -Urine/Cr at next visit -Crestor 20 mg -f/u 3 months  Orders: -     POCT glycosylated hemoglobin (Hb A1C) -     Basic metabolic panel; Future  Depression with anxiety Assessment & Plan: Depression stable to slightly worse. Patient appreciates that she get's down at end of day, and is also noticing that she is a little down before taking her medicine in the morning. She notes that her sleep is improved and she's eating well. But is looking for more support. She has a therapist but has not seen them yet, 2/2 scheduling conflicts (patient not wanting to be seen virtually). Patient denies any SI/HI.  -Increases Zoloft to 100 mg daily -See therapist weekly to  bi-weekly, virtual if that's what's available -Follow up in 2-4 wks.   Tinnitus aurium, left Assessment & Plan: Patient seen by audiology for tinnitus, noted to also have some sensineural hearing loss bilaterally, with asymmetric tinnitus in left ear. Audiology recommending ENT referral for evaluation of asymmetric/unilateral tinnitus.  -Referral to ENT   Other orders -     Rosuvastatin Calcium; Take 1 tablet (20 mg total) by mouth daily.  Dispense: 30 tablet; Refill: 2 -     Dapagliflozin Propanediol; Take 1 tablet (5 mg total) by mouth daily.  Dispense: 90 tablet; Refill: 3 -     Sertraline HCl; Take 1 tablet (100 mg total) by mouth daily.  Dispense: 30 tablet; Refill: 2   No follow-ups on file. Holley Bouche, MD 03/29/2022, 12:57 PM PGY-2, [facility]

## 2022-03-29 ENCOUNTER — Telehealth: Payer: Self-pay | Admitting: Student

## 2022-03-29 MED ORDER — DAPAGLIFLOZIN PROPANEDIOL 5 MG PO TABS: 5.0000 mg | ORAL_TABLET | Freq: Every day | ORAL | 3 refills | Status: DC

## 2022-03-29 MED ORDER — SERTRALINE HCL 100 MG PO TABS: 100.0000 mg | ORAL_TABLET | Freq: Every day | ORAL | 2 refills | Status: DC

## 2022-03-29 MED ORDER — ROSUVASTATIN CALCIUM 20 MG PO TABS: 20.0000 mg | ORAL_TABLET | Freq: Every day | ORAL | 2 refills | Status: DC

## 2022-03-29 NOTE — Assessment & Plan Note (Addendum)
Patient reports good compliance w/ insulin, fasting sugars around 140's-150's daily. Currently taking 12 units of glargine. A1c today 7.2, nearly at goal. Patient would benefit from glycemic control w/ SGLT2, will start [place] today and follow up in 3 months. Foot exam normal today. Patient on statin but low dose. Will increase dose to high intensity statin.  -Farxiga 5 mg daily -Insulin 12 units daily -Urine/Cr at next visit -Crestor 20 mg -f/u 3 months

## 2022-03-29 NOTE — Assessment & Plan Note (Signed)
Patient seen by audiology for tinnitus, noted to also have some sensineural hearing loss bilaterally, with asymmetric tinnitus in left ear. Audiology recommending ENT referral for evaluation of asymmetric/unilateral tinnitus.  -Referral to ENT

## 2022-03-29 NOTE — Telephone Encounter (Signed)
Meds resent

## 2022-03-29 NOTE — Telephone Encounter (Signed)
Patient came in stating that she needs the prescriptions she was just prescribed on 03/26/22 to be resent to the pharmacy through her insurance.

## 2022-03-29 NOTE — Addendum Note (Signed)
Addended by: Holley Bouche T on: 03/29/2022 07:10 PM   Modules accepted: Orders

## 2022-03-29 NOTE — Assessment & Plan Note (Signed)
Depression stable to slightly worse. Patient appreciates that she get's down at end of day, and is also noticing that she is a little down before taking her medicine in the morning. She notes that her sleep is improved and she's eating well. But is looking for more support. She has a therapist but has not seen them yet, 2/2 scheduling conflicts (patient not wanting to be seen virtually). Patient denies any SI/HI.  -Increases Zoloft to 100 mg daily -See therapist weekly to bi-weekly, virtual if that's what's available -Follow up in 2-4 wks.

## 2022-04-12 ENCOUNTER — Encounter: Payer: Self-pay | Admitting: Student

## 2022-04-12 ENCOUNTER — Ambulatory Visit (INDEPENDENT_AMBULATORY_CARE_PROVIDER_SITE_OTHER): Payer: BC Managed Care – PPO | Admitting: Student

## 2022-04-12 ENCOUNTER — Other Ambulatory Visit (HOSPITAL_COMMUNITY)
Admission: RE | Admit: 2022-04-12 | Discharge: 2022-04-12 | Disposition: A | Discharge status: Home / Self Care | Payer: BC Managed Care – PPO | Source: Ambulatory Visit | Attending: Family Medicine | Admitting: Family Medicine

## 2022-04-12 ENCOUNTER — Other Ambulatory Visit: Payer: Self-pay

## 2022-04-12 VITALS — BP 135/59 | HR 62 | Ht 66.0 in | Wt 163.6 lb

## 2022-04-12 DIAGNOSIS — Z124 Encounter for screening for malignant neoplasm of cervix: Secondary | ICD-10-CM | POA: Insufficient documentation

## 2022-04-12 DIAGNOSIS — E119 Type 2 diabetes mellitus without complications: Secondary | ICD-10-CM

## 2022-04-12 DIAGNOSIS — Z0000 Encounter for general adult medical examination without abnormal findings: Secondary | ICD-10-CM | POA: Diagnosis not present

## 2022-04-12 NOTE — Patient Instructions (Signed)
It was great to see you! Thank you for allowing me to participate in your care!  I recommend that you always bring your medications to each appointment as this makes it easy to ensure we are on the correct medications and helps [place] not miss when refills are needed.  Our plans for today:  - Today we performed a Pap Smear. I will call you with the results if abnormal, or send a letter if normal. - Checking a BMP for your electrolytes and kidney function  We are checking some labs today, I will call you if they are abnormal will send you a MyChart message or a letter if they are normal.  If you do not hear about your labs in the next 2 weeks please let us know.  Take care and seek immediate care sooner if you develop any concerns.   Dr. Holley Bouche, MD [place]

## 2022-04-12 NOTE — Progress Notes (Signed)
  SUBJECTIVE:   CHIEF COMPLAINT / HPI:   Pap Smear Patient come's in for Pap smear. Last 03/30/18 - nml Pap due today.  Collect BMP To evaluate kidney function, was not able to collect at last visit.   Crohn's Was told that she was misdiagnosed after her doctor retired and a new one saw her. Patient does not report any bowel issues. Repeat colonoscopy due in 2025.  PERTINENT  PMH / PSH: DM, MDD    Patient Care Team: Bess Kinds, MD as PCP - General (Family Medicine) OBJECTIVE:  BP (!) 135/59   Pulse 62   Ht 5\' 6"  (1.676 m)   Wt 163 lb 9.6 oz (74.2 kg)   SpO2 97%   BMI 26.41 kg/m  Physical Exam Exam conducted with a chaperone present.  Genitourinary:    Pubic Area: No rash.      Labia:        Right: No rash, tenderness or injury.        Left: No rash, tenderness or injury.      Vagina: No signs of injury. Vaginal discharge present. No erythema, tenderness, bleeding or lesions.     Cervix: No cervical motion tenderness, friability, lesion, erythema or cervical bleeding.      ASSESSMENT/PLAN:  Encounter for Papanicolaou smear for cervical cancer screening -     Cytology - PAP  Healthcare maintenance Assessment & Plan: Patient comes in for Pap Smear, last PAP was in 2020 and was normal. Patient pelvic exam benign w/ some discharge noted.  -Pap Smear  Orders: -     Basic metabolic panel  Type 2 diabetes mellitus without complication, without long-term current use of insulin (HCC) -     Microalbumin / creatinine urine ratio  Pap smear for cervical cancer screening Assessment & Plan: Patient comes in for Pap Smear, last PAP was in 2020 and was normal. Patient pelvic exam benign w/ some discharge noted.  -Pap Smear    No follow-ups on file. Bess Kinds, MD 04/14/2022, 4:09 PM PGY-2, [facility]

## 2022-04-13 LAB — BASIC METABOLIC PANEL
BUN/Creatinine Ratio: 27 (ref 12–28)
BUN: 21 mg/dL (ref 8–27)
CO2: 23 mmol/L (ref 20–29)
Calcium: 11.4 mg/dL — ABNORMAL HIGH (ref 8.7–10.3)
Chloride: 107 mmol/L — ABNORMAL HIGH (ref 96–106)
Creatinine, Ser: 0.77 mg/dL (ref 0.57–1.00)
Glucose: 88 mg/dL (ref 70–99)
Potassium: 4.2 mmol/L (ref 3.5–5.2)
Sodium: 144 mmol/L (ref 134–144)
eGFR: 88 mL/min/{1.73_m2} (ref >59)

## 2022-04-14 NOTE — Assessment & Plan Note (Signed)
Patient comes in for Pap Smear, last PAP was in 2020 and was normal. Patient pelvic exam benign w/ some discharge noted.  -Pap Smear

## 2022-04-15 LAB — MICROALBUMIN / CREATININE URINE RATIO
Creatinine, Urine: 96.9 mg/dL
Microalb/Creat Ratio: 9 mg/g creat (ref 0–29)
Microalbumin, Urine: 9.2 ug/mL

## 2022-04-18 LAB — CYTOLOGY - PAP
Comment: NEGATIVE
Diagnosis: NEGATIVE
High risk HPV: NEGATIVE

## 2022-04-22 ENCOUNTER — Ambulatory Visit: Payer: BC Managed Care – PPO | Admitting: Behavioral Health

## 2022-04-29 ENCOUNTER — Ambulatory Visit (INDEPENDENT_AMBULATORY_CARE_PROVIDER_SITE_OTHER): Payer: BC Managed Care – PPO | Admitting: Behavioral Health

## 2022-04-29 DIAGNOSIS — F418 Other specified anxiety disorders: Secondary | ICD-10-CM | POA: Diagnosis not present
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
## 2022-04-29 NOTE — Progress Notes (Signed)
                Elizabeth Andrade Elizabeth Maple, LMFT 

## 2022-05-02 ENCOUNTER — Encounter: Payer: Self-pay | Admitting: Student

## 2022-05-02 ENCOUNTER — Ambulatory Visit (INDEPENDENT_AMBULATORY_CARE_PROVIDER_SITE_OTHER): Payer: BC Managed Care – PPO | Admitting: Student

## 2022-05-02 DIAGNOSIS — E119 Type 2 diabetes mellitus without complications: Secondary | ICD-10-CM | POA: Diagnosis not present

## 2022-05-02 DIAGNOSIS — F418 Other specified anxiety disorders: Secondary | ICD-10-CM
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
## 2022-05-07 ENCOUNTER — Encounter: Payer: Self-pay | Admitting: Student

## 2022-05-07 NOTE — Patient Instructions (Signed)
It was great to see you! Thank you for allowing me to participate in your care!  I recommend that you always bring your medications to each appointment as this makes it easy to ensure we are on the correct medications and helps [place] not miss when refills are needed.  Our plans for today:  - Continue Zoloft - F/u 3 months  We are checking some labs today, I will call you if they are abnormal will send you a MyChart message or a letter if they are normal.  If you do not hear about your labs in the next 2 weeks please let us know.  Take care and seek immediate care sooner if you develop any concerns.   Dr. Bess Kinds, MD [facility]

## 2022-05-07 NOTE — Assessment & Plan Note (Signed)
Patient w/ mild hypercalcemia of 11.4 on 04/12/22 on BMP. Reports she takes 5,000 units of vitamin D usually, but has been off for the last 2 weeks. Likely this is source of her hyper calcemia, but will evaluate.  -CMP -Vit D -PTH

## 2022-05-07 NOTE — Assessment & Plan Note (Signed)
Patient reports depression has greatly improved and that her mood is good. Reports good compliance with medication. Is sleeping and eating better, has been speaking to a therapist and feels that it helps. She appears happy. She denies any SI/HI -Continue Zoloft 100 mg -F/u 3 months

## 2022-05-07 NOTE — Assessment & Plan Note (Signed)
Patient reports good compliance with meds. Sugar range 120-140. -Farxiga 5 mg daily -Insulin 12 units daily -F/u 3 months

## 2022-05-17 ENCOUNTER — Other Ambulatory Visit: Payer: Self-pay | Admitting: Student

## 2022-05-17 ENCOUNTER — Telehealth: Payer: Self-pay | Admitting: Student

## 2022-05-17 NOTE — Telephone Encounter (Signed)
Called because I let patient leave from last appointment, w'o having her go to be seen in lab for a blood draw. Her last labs showed an elevated Calcium. This may be coming from her taking Vitamin D, but want to be sure nothing else is going on.   I've ordered labs to be drawn in the future. Patient will get CMP, PTH, Vitamin D levels to look at her calcium levels/it's management.   Once patient get's this message, I'd like patient to come to clinic for a lab appointment. Patient can schedule or just show up.   Orders already placed

## 2022-05-21 ENCOUNTER — Ambulatory Visit: Payer: BC Managed Care – PPO | Admitting: Behavioral Health

## 2022-05-23 ENCOUNTER — Other Ambulatory Visit: Payer: BC Managed Care – PPO

## 2022-05-31 LAB — COMPREHENSIVE METABOLIC PANEL
ALT: 22 IU/L (ref 0–32)
AST: 22 IU/L (ref 0–40)
Albumin/Globulin Ratio: 1.7 (ref 1.2–2.2)
Albumin: 4.8 g/dL (ref 3.9–4.9)
Alkaline Phosphatase: 98 IU/L (ref 44–121)
BUN/Creatinine Ratio: 23 (ref 12–28)
BUN: 21 mg/dL (ref 8–27)
Bilirubin Total: 0.5 mg/dL (ref 0.0–1.2)
CO2: 23 mmol/L (ref 20–29)
Calcium: 11.1 mg/dL — ABNORMAL HIGH (ref 8.7–10.3)
Chloride: 106 mmol/L (ref 96–106)
Creatinine, Ser: 0.92 mg/dL (ref 0.57–1.00)
Globulin, Total: 2.8 g/dL (ref 1.5–4.5)
Glucose: 93 mg/dL (ref 70–99)
Potassium: 4.3 mmol/L (ref 3.5–5.2)
Sodium: 143 mmol/L (ref 134–144)
Total Protein: 7.6 g/dL (ref 6.0–8.5)
eGFR: 71 mL/min/{1.73_m2} (ref >59)

## 2022-05-31 LAB — VITAMIN D, 25-HYDROXY, TOTAL: Vitamin D, 25-Hydroxy, Serum: 33 ng/mL

## 2022-05-31 LAB — PTH, INTACT AND CALCIUM: PTH: 57 pg/mL (ref 15–65)

## 2022-06-04 ENCOUNTER — Other Ambulatory Visit: Payer: Self-pay | Admitting: Student

## 2022-06-04 ENCOUNTER — Telehealth: Payer: Self-pay | Admitting: Student

## 2022-06-04 NOTE — Telephone Encounter (Signed)
Called to inform patient about elevated calcium. We'd talked about this in a previous visit, but her Ca was elevated on her labwork. We checked a couple other labs related to managing calcium in the body (PTH and Vitamin D), and both were in normal range. (PTH was in the high normal range but should be low, if calcium is high-which her's is. This is an abnormal finding).   I'm going to be sending her to the Endocrinologist for work up/management. Just wanted to let patient know, before she got a call about scheduling.

## 2022-06-04 NOTE — Telephone Encounter (Signed)
Patient returns call to nurse line.   Patient advised of continued elevated calcium. Patient advised of referral to Endocrinology for further workout.  All questions answered.

## 2022-06-04 NOTE — Progress Notes (Unsigned)
Will be sending patient to endocrine, for Hypercalcemia. Patient has had elevated Ca going back years, w/ normal Vit D and normal PTH. Spoke w/ nephrologist Dr. Valentino Nose for curbside about patient hypercalcemia, and he recommended patient be seen by endocrine. Will place referral for endocrine specialist.

## 2022-06-06 ENCOUNTER — Encounter: Payer: Self-pay | Admitting: Student

## 2022-06-12 ENCOUNTER — Ambulatory Visit: Payer: BC Managed Care – PPO | Admitting: Behavioral Health

## 2022-06-28 ENCOUNTER — Other Ambulatory Visit: Payer: Self-pay | Admitting: Student

## 2022-07-08 NOTE — Progress Notes (Unsigned)
  SUBJECTIVE:   CHIEF COMPLAINT / HPI:   DM Last seen 05/02/22 Meds: -Farxiga 5 mg daily -Insulin 12 units daily Foot exam  Depression Last seen 05/02/22, improved and patient was feeling good and seeing therapist Meds: Zoloft 100 mg   PERTINENT  PMH / PSH: ***  Past Medical History:  Diagnosis Date   ABNORMAL PAP SMEAR, LGSIL 02/11/2007   Qualifier: Diagnosis of  By: Sandi Mealy  MD, Stephanie     Allergy    Anemia    Colitis    Crohn's colitis, without complications ([facility]) 11/10/2015   Crohn's disease ([facility])    Diabetes mellitus without complication ([facility])    Diabetes mellitus, new onset ([facility]) 02/28/2014   Oropharyngeal candidiasis 02/28/2014    Patient Care Team: Bess Kinds, MD as PCP - General (Family Medicine) OBJECTIVE:  There were no vitals taken for this visit. Physical Exam   ASSESSMENT/PLAN:  There are no diagnoses linked to this encounter. No follow-ups on file. Bess Kinds, MD 07/08/2022, 6:43 PM PGY-***, [facility] {    This will disappear when note is signed, click to select method of visit    :1}

## 2022-07-08 NOTE — Patient Instructions (Incomplete)
It was great to see you! Thank you for allowing me to participate in your care!  I recommend that you always bring your medications to each appointment as this makes it easy to ensure we are on the correct medications and helps [place] not miss when refills are needed.  Our plans for today:  - Diabetes Your diabetes is really improved and doing well. Your A1c today was 6.8, our goal is to keep it under 7! Continue the same regimen  Farxiag 5 mg daily  Insuline 12 units daily Ozempic We will start this medicine. It's a weekly injection. Schedule an appointment with our Pharmacist, Dr. Madelon Lips, to review how to use the medicine. (Start at lowest dose, take for 4 weeks, then increase dose to next level) Follow up appointment in 3 months  - Depression  Your depression is doing well! You've done great work with it!  Continue Zoloft 100 mg  Make follow up appointment in 6 months  Take care and seek immediate care sooner if you develop any concerns.   Dr. Bess Kinds, MD [facility]

## 2022-07-09 ENCOUNTER — Encounter: Payer: Self-pay | Admitting: Student

## 2022-07-09 ENCOUNTER — Ambulatory Visit: Payer: BC Managed Care – PPO | Admitting: Student

## 2022-07-09 VITALS — BP 123/102 | HR 56 | Ht 66.0 in | Wt 174.0 lb

## 2022-07-09 DIAGNOSIS — F418 Other specified anxiety disorders: Secondary | ICD-10-CM | POA: Diagnosis not present

## 2022-07-09 DIAGNOSIS — E119 Type 2 diabetes mellitus without complications: Secondary | ICD-10-CM

## 2022-07-09 LAB — POCT GLYCOSYLATED HEMOGLOBIN (HGB A1C): HbA1c, POC (controlled diabetic range): 6.8 % (ref 0.0–7.0)

## 2022-07-09 MED ORDER — SEMAGLUTIDE(0.25 OR 0.5MG/DOS) 2 MG/3ML ~~LOC~~ SOPN
0.2500 mg | PEN_INJECTOR | SUBCUTANEOUS | 1 refills | Status: DC
Start: 2022-07-09 — End: 2022-10-11

## 2022-07-11 NOTE — Assessment & Plan Note (Signed)
Patient notes depression is doing well.  Denies any low mood sad thoughts.  Denies any SI/HI.  Has not seen her therapist secondary to cost but would like to go back.  Will continue Zoloft. - Zoloft 100 mg - Follow-up 6 months - Consider follow-up with therapist

## 2022-07-11 NOTE — Assessment & Plan Note (Signed)
Patient presents for follow-up on diabetes.  Patient notes good compliance with medications.  Patient also notes CBGs at home ranging in the 140s.  A1c today is 6.8, and at goal.  Patient noting challenges with weight gain, will initiate Ozempic for diabetes control, and weight control.  Discussed Ozempic with patient, patient open and willing to try. - Farxiga 5 mg daily - Insulin 12 units daily - Ozempic 0.25 mg weekly - Schedule appointment with Dr. Richardson Dopp for for instructions on Ozempic use - Follow-up in 3 months

## 2022-07-11 NOTE — Assessment & Plan Note (Signed)
Patient notes she forgot to schedule appointment with endocrinologist.  Contact information given to patient.  Patient plans to schedule appointment. - Follow-up with endocrine

## 2022-07-22 ENCOUNTER — Encounter: Payer: Self-pay | Admitting: Pharmacist

## 2022-07-22 ENCOUNTER — Ambulatory Visit: Payer: BC Managed Care – PPO | Admitting: Pharmacist

## 2022-07-22 VITALS — BP 131/59 | HR 65 | Ht 66.0 in | Wt 174.2 lb

## 2022-07-22 DIAGNOSIS — Z87891 Personal history of nicotine dependence: Secondary | ICD-10-CM

## 2022-07-22 DIAGNOSIS — E119 Type 2 diabetes mellitus without complications: Secondary | ICD-10-CM | POA: Diagnosis not present

## 2022-07-22 MED ORDER — INSULIN PEN NEEDLE 30G X 5 MM MISC
11 refills | Status: AC
Start: 2022-07-22 — End: ?

## 2022-07-22 MED ORDER — INSULIN GLARGINE 100 UNIT/ML ~~LOC~~ SOLN: 6.0000 [IU] | SUBCUTANEOUS | 1 refills | Status: DC

## 2022-07-22 NOTE — Patient Instructions (Signed)
It was nice to see you today!  Your goal blood sugar is 80-130 before eating and less than 180 after eating.  Medication Changes: Begin Ozempic (Semglutide) 0.25mg  once weekly on TUESDAY.   Decrease lantus (insulin glargine) to 6 units every morning.   HOLD (do not start yet) Farxitga (dapagliflozin) - we will discuss that more at your next visti.   Monitor blood sugars at home and keep a log (glucometer or piece of paper) to bring with you to your next visit.  Keep up the good work with diet and exercise. Aim for a diet full of vegetables, fruit and lean meats (chicken, [place], fish). Try to limit salt intake by eating fresh or frozen vegetables (instead of canned), rinse canned vegetables prior to cooking and do not add any additional salt to meals.

## 2022-07-22 NOTE — Progress Notes (Signed)
S:     Chief Complaint  Patient presents with   Medication Management    Diabetes    62 y.o. female who presents for diabetes evaluation, education, and management.  PMH is significant for Diabetes as well as history or both alcohol and tobacco use disorder.  Patient was referred and last seen by Primary Care Provider, Dr. Barbaraann Faster, on 07/09/2022.  At last visit, patient was prescribed Ozempic (semaglutide).     Today, patient arrives in good spirits and presents without any assistance. She comes to the visit for instruction/review of Ozempic and diabetes treatment plan adjustment.   Patient reports Diabetes was diagnosed many years ago.    Current diabetes medications include: Lantus (insulin glargine) 12 units daily.  Patient is NOT taking Farxiga (dapagliflozin) and has not yet started Ozempic (semaglutide).   Current hyperlipidemia medications include: Rosuvastatin 20mg   Patient reports adherence to taking all medications as prescribed with exception of Farxiga.     Do you feel that your medications are working for you? yes Have you been experiencing any side effects to the medications prescribed? no  Patient denies hypoglycemic events.  Reported home fasting blood sugars: 140-168 with lowest recent reading of mid 130s.     O:   Review of Systems  All other systems reviewed and are negative.   Physical Exam Constitutional:      Appearance: Normal appearance.  Pulmonary:     Effort: Pulmonary effort is normal.  Neurological:     Mental Status: She is alert.  Psychiatric:        Mood and Affect: Mood normal.        Behavior: Behavior normal.    Lab Results  Component Value Date   HGBA1C 6.8 07/09/2022   Vitals:   07/22/22 1017  BP: (!) 131/59  Pulse: 65  SpO2: 99%    Lipid Panel     Component Value Date/Time   CHOL 160 12/14/2021 1646   TRIG 174 (H) 12/14/2021 1646   HDL 29 (L) 12/14/2021 1646   CHOLHDL 5.5 (H) 12/14/2021 1646   LDLCALC 100 (H)  12/14/2021 1646    Clinical Atherosclerotic Cardiovascular Disease (ASCVD): No  The 10-year ASCVD risk score (Arnett DK, et al., 2019) is: 15.5%   Values used to calculate the score:     Age: 50 years     Sex: Female     Is Non-Hispanic African American: Yes     Diabetic: Yes     Tobacco smoker: No     Systolic Blood Pressure: 131 mmHg     Is BP treated: No     HDL Cholesterol: 29 mg/dL     Total Cholesterol: 160 mg/dL    A/P: Diabetes longstanding and currently well controlled. Patient is able to verbalize appropriate hypoglycemia management plan. Medication adherence appears good with insulin however admits to never taking any Farxiga (dapagliflozin).  She is willing to start Ozempic.  Potential patient for insulin taper down and possibly off of insulin in the future.  -Decreased dose of basal insulin Lantus (insulin glargine) from 12 to 6 units once daily - starting tomorrow.    -Increased dose of GLP-1 Ozempic (semaglutide) at 0.25mg  weekly dose - starting tomorrow Tuesday 07/23/2022.   - Continue to HOLD  SGLT2-I Farxiga (dapagliflozin) due to patient reluctance to use and plan to start alternate therapy (Semaglutide) tomorrow.  -Patient educated on purpose, proper use, and potential adverse effects of both GLP and SGLT-2 therapy.   -Extensively discussed  pathophysiology of diabetes, recommended lifestyle interventions, dietary effects on blood sugar control.  -Counseled on s/sx of and management of hypoglycemia.    ASCVD risk - primary prevention in patient with diabetes. Last LDL is 100 not at goal of <78 mg/dL. ASCVD risk factors include Diabetes and 10-year ASCVD risk score of 15.5% high intensity statin indicated.  -Continued Rosuvastatin 20 mg  History of smoking.  Reports complete cessation from smoking since 10/2021 Rates confidence in her ability to refrain from smoking long-term as 10/10 Shared that husband also quit at the same time and this has provided high level of  support.  Encouraged continued abstinence, congratulated on successful quit.  Adjusted problem list to "history of tobacco use disorder".   Written patient instructions provided. Patient verbalized understanding of treatment plan.  Total time in face to face counseling 28 minutes.    Follow-up:  Pharmacist 2 weeks. PCP clinic visit in ~4 weeks.

## 2022-07-22 NOTE — Assessment & Plan Note (Signed)
History of smoking.  Reports complete cessation from smoking since 10/2021 Rates confidence in her ability to refrain from smoking long-term as 10/10 Shared that husband also quit at the same time and this has provided high level of support.  Encouraged continued abstinence, congratulated on successful quit.  Adjusted problem list to "history of tobacco use disorder".

## 2022-07-22 NOTE — Assessment & Plan Note (Signed)
Diabetes longstanding and currently well controlled. Patient is able to verbalize appropriate hypoglycemia management plan. Medication adherence appears good with insulin however admits to never taking any Farxiga (dapagliflozin).  She is willing to start Ozempic.  Potential patient for insulin taper down and possibly off of insulin in the future.  -Decreased dose of basal insulin Lantus (insulin glargine) from 12 to 6 units once daily - starting tomorrow.    -Increased dose of GLP-1 Ozempic (semaglutide) at 0.25mg  weekly dose - starting tomorrow Tuesday 07/23/2022.   -Continue to HOLD SGLT2-I Farxiga (dapagliflozin) due to patient reluctance to use and plan to start alternate therapy (Semaglutide) tomorrow.  -Patient educated on purpose, proper use, and potential adverse effects of both GLP and SGLT-2 therapy.   -Extensively discussed pathophysiology of diabetes, recommended lifestyle interventions, dietary effects on blood sugar control.  -Counseled on s/sx of and management of hypoglycemia.   ASCVD risk - primary prevention in patient with diabetes. Last LDL is 100 not at goal of <16 mg/dL. ASCVD risk factors include Diabetes and 10-year ASCVD risk score of 15.5% high intensity statin indicated.  -Continued Rosuvastatin 20 mg

## 2022-07-22 NOTE — Progress Notes (Signed)
Reviewed and agree with Dr Koval's plan.   

## 2022-08-05 ENCOUNTER — Encounter: Payer: Self-pay | Admitting: Pharmacist

## 2022-08-05 ENCOUNTER — Ambulatory Visit (INDEPENDENT_AMBULATORY_CARE_PROVIDER_SITE_OTHER): Payer: BC Managed Care – PPO | Admitting: Pharmacist

## 2022-08-05 VITALS — BP 118/58 | HR 63 | Ht 66.0 in | Wt 172.4 lb

## 2022-08-05 DIAGNOSIS — E119 Type 2 diabetes mellitus without complications: Secondary | ICD-10-CM | POA: Diagnosis not present

## 2022-08-05 DIAGNOSIS — Z87891 Personal history of nicotine dependence: Secondary | ICD-10-CM | POA: Diagnosis not present

## 2022-08-05 NOTE — Assessment & Plan Note (Signed)
Congratulated on continued abstinence from smoking.  Quit for ~ 9 months at this time.

## 2022-08-05 NOTE — Assessment & Plan Note (Signed)
Diabetes since 2016, currently well controlled based on home glucose readings. Patient is able to verbalize appropriate hypoglycemia management plan. Medication adherence appears good with insulin and has taken two doses of Ozempic (semaglutide) with only minor episode of nausea with a larger meal.  Potential patient for insulin taper off of insulin in the future.  -Continues basal insulin Lantus (insulin glargine) at 6 units once daily.    -Continued GLP-1 Ozempic (semaglutide) at 0.25mg  weekly dose.   -Continue to HOLD  SGLT2-I Farxiga (dapagliflozin) due to patient reluctance to use.  -Discussed kidney and heart benefits. Patient educated on purpose, proper use, and potential adverse effects of both GLP and SGLT-2 therapy.   -Extensively discussed pathophysiology of diabetes, recommended lifestyle interventions, dietary effects on blood sugar control.  -Counseled on s/sx of and management of hypoglycemia.

## 2022-08-05 NOTE — Patient Instructions (Signed)
Great to see you today.   Your goal blood sugar is 80-130 before eating and less than 180 after eating.  Medication Changes: Continue all medications the same today.  No change until next appointment.    Keep up the good work with diet and exercise. Aim for a diet full of vegetables, fruit and lean meats (chicken, [place], fish). Try to limit salt intake by eating fresh or frozen vegetables (instead of canned), rinse canned vegetables prior to cooking and do not add any additional salt to meals.

## 2022-08-05 NOTE — Progress Notes (Signed)
S:     Chief Complaint  Patient presents with   Medication Management    diabetes   62 y.o. female who presents for diabetes evaluation, education, and management.  PMH is significant for Diabetes as well as history or both alcohol and tobacco use disorder.  Patient was referred and last seen by Primary Care Provider, Dr. Barbaraann Faster, on 07/09/2022.  At last visit, patient was initiated on Ozempic (semaglutide).      Today, patient arrives in good spirits and presents without any assistance. She comes to the visit for for follow-up of Ozempic (semaglutide) initiation and diabetes management.    Patient reports Diabetes was diagnosed ~ 2016 but unsure if it was earlier.   Current diabetes medications include:  Lantus (insulin glargine) 6 units daily   Ozempic (semaglutide) Patient is NOT taking Farxiga (dapagliflozin) but has a supply at home.    Patient reports adherence to taking all medications as prescribed with exception of Farxiga.       Do you feel that your medications are working for you? yes Have you been experiencing any side effects to the medications prescribed? Mild nausea with a few episodes of eating more than usual.    Patient denies hypoglycemic events.   Reported home fasting blood sugars: 120-130 with lowest recent reading of 110.    Patient denies nocturia (nighttime urination).   Patient-reported exercise habits: ADLs - plans to begin short exercise on-line classes.  Committed to try and do at least 4X daily.    O:   Review of Systems  All other systems reviewed and are negative.   Physical Exam Pulmonary:     Effort: Pulmonary effort is normal.     Breath sounds: Normal breath sounds.  Neurological:     Mental Status: She is alert.  Psychiatric:        Mood and Affect: Mood normal.        Behavior: Behavior normal.        Thought Content: Thought content normal.        Judgment: Judgment normal.    7 day average fasting blood glucose on  home monitor: 135mg /dl  Lab Results  Component Value Date   HGBA1C 6.8 07/09/2022   Vitals:   08/05/22 1006  BP: (!) 118/58  Pulse: 63  SpO2: 99%    Lipid Panel     Component Value Date/Time   CHOL 160 12/14/2021 1646   TRIG 174 (H) 12/14/2021 1646   HDL 29 (L) 12/14/2021 1646   CHOLHDL 5.5 (H) 12/14/2021 1646   LDLCALC 100 (H) 12/14/2021 1646    Clinical Atherosclerotic Cardiovascular Disease (ASCVD): No  The 10-year ASCVD risk score (Arnett DK, et al., 2019) is: 11.8%   Values used to calculate the score:     Age: 52 years     Sex: Female     Is Non-Hispanic African American: Yes     Diabetic: Yes     Tobacco smoker: No     Systolic Blood Pressure: 118 mmHg     Is BP treated: No     HDL Cholesterol: 29 mg/dL     Total Cholesterol: 160 mg/dL    A/P: Diabetes since 2016, currently well controlled based on home glucose readings. Patient is able to verbalize appropriate hypoglycemia management plan. Medication adherence appears good with insulin and has taken two doses of Ozempic (semaglutide) with only minor episode of nausea with a larger meal.  Potential patient for insulin taper off of  insulin in the future.  -Continues basal insulin Lantus (insulin glargine) at 6 units once daily.    -Continued GLP-1 Ozempic (semaglutide) at 0.25mg  weekly dose.   -Continue to HOLD  SGLT2-I Farxiga (dapagliflozin) due to patient reluctance to use.  -Discussed kidney and heart benefits. Patient educated on purpose, proper use, and potential adverse effects of both GLP and SGLT-2 therapy.   -Extensively discussed pathophysiology of diabetes, recommended lifestyle interventions, dietary effects on blood sugar control.  -Counseled on s/sx of and management of hypoglycemia.   Written patient instructions provided. Patient verbalized understanding of treatment plan.  Total time in face to face counseling 18 minutes.    Follow-up:  Pharmacist 4 weeks. PCP clinic visit in 8 weeks.

## 2022-08-05 NOTE — Progress Notes (Signed)
Reviewed and agree with Dr Koval's plan.   

## 2022-09-05 ENCOUNTER — Ambulatory Visit: Payer: BC Managed Care – PPO | Admitting: Pharmacist

## 2022-09-09 ENCOUNTER — Encounter: Payer: Self-pay | Admitting: Pharmacist

## 2022-09-09 ENCOUNTER — Ambulatory Visit (INDEPENDENT_AMBULATORY_CARE_PROVIDER_SITE_OTHER): Payer: BC Managed Care – PPO | Admitting: Pharmacist

## 2022-09-09 VITALS — BP 125/70 | HR 63 | Ht 67.0 in | Wt 169.8 lb

## 2022-09-09 DIAGNOSIS — E119 Type 2 diabetes mellitus without complications: Secondary | ICD-10-CM

## 2022-09-09 MED ORDER — ROSUVASTATIN CALCIUM 20 MG PO TABS: 20.0000 mg | ORAL_TABLET | Freq: Every day | ORAL | 2 refills | Status: DC

## 2022-09-09 NOTE — Patient Instructions (Signed)
It was nice to see you today!  Your goal blood sugar is 80-130 before eating and less than 180 after eating.  Medication Changes: Increase Ozempic (semaglutide) dose to 0.5mg  once weekly  Discontinue Lantus (insulin glargine)  Continue all other medication the same.   Monitor blood sugars at home and keep a log (piece of paper) to bring with you to your next visit.  Keep up the good work with diet and exercise. Aim for a diet full of vegetables, fruit and lean meats (chicken, [place], fish). Try to limit salt intake by eating fresh or frozen vegetables (instead of canned), rinse canned vegetables prior to cooking and do not add any additional salt to meals.

## 2022-09-09 NOTE — Assessment & Plan Note (Signed)
Diabetes longstanding currently controlled with occasional hypoglycemia (which is resolved with a piece of candy). Patient is able to verbalize appropriate hypoglycemia management plan. Medication adherence appears optimal. -Discontinued basal insulin Lantus (insulin glargine). Patient asked to contact us if glucose readings are "concerning" (over 200) after stopping insulin. -Increased dose of GLP-1 Ozempic (semaglutide) from 0.25 mg to 0.5 mg  -Patient educated on purpose, proper use, and potential adverse effects.  -Extensively discussed pathophysiology of diabetes, recommended lifestyle interventions, dietary effects on blood sugar control.  -Counseled on s/sx of and management of hypoglycemia.  -Next A1c anticipated November to December.

## 2022-09-09 NOTE — Progress Notes (Signed)
S:     Chief Complaint  Patient presents with   Medication Management    Diabetes   62 y.o. female who presents for diabetes evaluation, education, and management. Patient arrives in good spirits and presents without any assistance.   PMH is significant for Diabetes as well as history or both alcohol and tobacco use disorder.  Patient was referred and last seen by Primary Care Provider, Dr. Barbaraann Faster, on 07/09/2022. Patient was last seen in pharmacy clinic on 08/05/22.  PMH is significant for diabetes, alcohol use disorder, tobacco use.  At last visit, continued basal insulin Lantus (insulin glargine) at 6 units once daily, GLP-1 Ozempic (semaglutide) at 0.25mg  weekly dose, continued to HOLD  SGLT2-I Farxiga (dapagliflozin) due to patient reluctance to use.   Patient reports Diabetes was diagnosed ~ 2016 but unsure if it was earlier.   Current diabetes medications include: Lantus (insulin glargine) 6 units once daily, Ozempic (semaglutide) 0.25mg  once weekly Current hyperlipidemia medications include: rosuvastatin 20mg  once daily  Patient reports adherence to taking all medications as prescribed.   Patient reports checking blood sugars once daily every morning, denies checking blood pressures at home Fasting blood sugar was 111 this morning. Do you feel that your medications are working for you? yes Have you been experiencing any side effects to the medications prescribed? no Insurance coverage: [employer]  Patient says she is unsure whether she has had any hypoglycemic events recently. When she feels shakey and is unable to check her blood sugar, she takes a piece of candy and she feels better.  Patient neuropathy (nerve pain) two to three times per week in the hands and feet. Patient denies visual changes. Patient denies self foot exams.   O:   Review of Systems  Neurological:  Positive for headaches.  All other systems reviewed and are negative.   Physical  Exam Constitutional:      Appearance: Normal appearance. She is normal weight.  Pulmonary:     Effort: Pulmonary effort is normal.  Neurological:     Mental Status: She is alert.  Psychiatric:        Mood and Affect: Mood normal.        Behavior: Behavior normal.        Thought Content: Thought content normal.        Judgment: Judgment normal.    Lab Results  Component Value Date   HGBA1C 6.8 07/09/2022   Vitals:   09/09/22 1039 09/09/22 1045  BP: 135/71 125/70  Pulse:  63  SpO2: 100%     Lipid Panel     Component Value Date/Time   CHOL 160 12/14/2021 1646   TRIG 174 (H) 12/14/2021 1646   HDL 29 (L) 12/14/2021 1646   CHOLHDL 5.5 (H) 12/14/2021 1646   LDLCALC 100 (H) 12/14/2021 1646    Clinical Atherosclerotic Cardiovascular Disease (ASCVD): No  The 10-year ASCVD risk score (Arnett DK, et al., 2019) is: 14.3%   Values used to calculate the score:     Age: 47 years     Sex: Female     Is Non-Hispanic African American: Yes     Diabetic: Yes     Tobacco smoker: No     Systolic Blood Pressure: 125 mmHg     Is BP treated: No     HDL Cholesterol: 29 mg/dL     Total Cholesterol: 160 mg/dL    A/P: Diabetes longstanding currently controlled with occasional hypoglycemia (which is resolved with a  piece of candy). Patient is able to verbalize appropriate hypoglycemia management plan. Medication adherence appears optimal. -Discontinued basal insulin Lantus (insulin glargine). Patient asked to contact us if glucose readings are "concerning" (over 200) after stopping insulin. -Increased dose of GLP-1 Ozempic (semaglutide) from 0.25 mg to 0.5 mg  -Patient educated on purpose, proper use, and potential adverse effects.  -Extensively discussed pathophysiology of diabetes, recommended lifestyle interventions, dietary effects on blood sugar control.  -Counseled on s/sx of and management of hypoglycemia.  -Next A1c anticipated November to December.   ASCVD risk - primary  prevention in patient with diabetes. Last LDL was 100 on 12/14/21 not at goal of <70 mg/dL. high intensity statin indicated.   -Continued rosuvastatin 20 mg - Repeat Cholesterol evaluation post statin initiation at next lab draw.  If after 11/17 suggest panel.  If lab draw prior to 11/17 then direct LDL.    Written patient instructions provided. Patient verbalized understanding of treatment plan.  Total time in face to face counseling 26 minutes.    Follow-up:  Pharmacist 6 weeks on 10/22/22 PCP clinic visit in to be determined at next pharmacy follow up Patient seen with Rickey Primus, PharmD Candidate and Andee Poles, PharmD Candidate.

## 2022-09-10 NOTE — Progress Notes (Signed)
Reviewed and agree with Dr Koval's plan.   

## 2022-09-28 ENCOUNTER — Other Ambulatory Visit: Payer: Self-pay | Admitting: Student

## 2022-10-08 ENCOUNTER — Other Ambulatory Visit: Payer: Self-pay | Admitting: Student

## 2022-10-11 ENCOUNTER — Telehealth: Payer: Self-pay | Admitting: Student

## 2022-10-11 NOTE — Telephone Encounter (Signed)
Called to discuss endocrinology referral and Ozempic refilled with patient.  Patient notes that she attempted to follow-up with endocrinologist but was told that she had missed a previous appointment on June 6.  Thus she was not allowed to follow with the endocrinologist because she missed an appointment previously.  She was unsure about how to follow-up/find a new endocrinologist.  Discussed with patient that I would discuss with our team about placing a new referral.  Also discussed Ozempic refilled.  Patient reports is going well and that Dr. Richardson Dopp less increased dose.  Discussed there is a risk of her hypercalcemia being related to cancer, and that is why we are sending her to an endocrinologist.  If she is found to have cancer we will have to stop her Ozempic, depending on the type of cancer.  Discussed this with patient and she was in agreement.  Provider planning to place second referral for endocrinologist and refill Ozempic.

## 2022-10-17 ENCOUNTER — Telehealth: Payer: Self-pay

## 2022-10-17 ENCOUNTER — Other Ambulatory Visit (HOSPITAL_COMMUNITY): Payer: Self-pay

## 2022-10-17 NOTE — Telephone Encounter (Signed)
Pharmacy Patient Advocate Encounter   Received notification from Fax that prior authorization for [facility] is required/requested.   Insurance verification completed.   The patient is insured through [employer] [facility] .   Per test claim: PA required; PA submitted to [employer] [facility] via CoverMyMeds Key/confirmation #/EOC [id number]. Status is pending .

## 2022-10-21 NOTE — Telephone Encounter (Signed)
Pharmacy Patient Advocate Encounter  Received notification from [facility] that Prior Authorization for [facility] has been APPROVED from 10/17/22 to 10/26/25

## 2022-10-22 ENCOUNTER — Ambulatory Visit: Payer: BC Managed Care – PPO | Admitting: Pharmacist

## 2022-10-22 ENCOUNTER — Encounter: Payer: Self-pay | Admitting: Pharmacist

## 2022-10-22 VITALS — BP 121/60 | HR 63 | Ht 66.0 in | Wt 167.0 lb

## 2022-10-22 DIAGNOSIS — E119 Type 2 diabetes mellitus without complications: Secondary | ICD-10-CM

## 2022-10-22 DIAGNOSIS — Z7984 Long term (current) use of oral hypoglycemic drugs: Secondary | ICD-10-CM | POA: Diagnosis not present

## 2022-10-22 NOTE — Progress Notes (Signed)
S:     Chief Complaint  Patient presents with   Medication Management    Diabetes   62 y.o. female who presents for diabetes evaluation, education, and management. Patient arrives in good spirits and presents without any assistance.  She quickly admits she did not pick up her Ozempic (semaglutide) as she was concerned with her calcium (lab from April).  Patient reports that her mother has an elevated calcium and her provider has monitored it for years without any significant clinical manifestation.     Patient was referred and last seen by Primary Care Provider, Dr. Barbaraann Faster, on 07/09/2022. Patient was last seen in pharmacy clinic on 09/09/22.   PMH is significant for diabetes, alcohol use disorder, tobacco use.  At last visit, stopped  basal insulin Lantus (insulin glargine), GLP-1 Ozempic (semaglutide) was increased from 0.25mg  to 0.5mg  weekly dose, we also continued to HOLD  SGLT2-I Farxiga (dapagliflozin) due to patient reluctance to use.     Current diabetes medications include: Ozempic (semaglutide) 0.5mg  once weekly for 2 weeks then took remaining dose (likely 0.25mg  partial dose in pen, last week.   Current hyperlipidemia medications include: rosuvastatin 20mg  once daily   Patient reports she was concerned to take any more Ozempic (semaglutide) following call from PCP RE elevated Calcium and relationship of Ozempic to this lab result.    Patient reports checking blood sugars once daily every morning, denies checking blood pressures at home Fasting blood sugar was 117, 123, 112, 102 in the morning. Do you feel that your medications are working for you? yes Have you been experiencing any side effects to the medications prescribed? no Insurance coverage: [employer]   Patient says she is unsure whether she has had any hypoglycemic events recently. When she feels shakey and is unable to check her blood sugar, she takes a piece of candy and she feels better.     O:    Review of Systems  All other systems reviewed and are negative.   Physical Exam Constitutional:      Appearance: Normal appearance. She is normal weight.  Pulmonary:     Effort: Pulmonary effort is normal.  Neurological:     Mental Status: She is alert.  Psychiatric:        Mood and Affect: Mood normal.        Behavior: Behavior normal.        Thought Content: Thought content normal.        Judgment: Judgment normal.    Lab Results  Component Value Date   HGBA1C 6.8 07/09/2022   Vitals:   10/22/22 1538  BP: 121/60  Pulse: 63  SpO2: 99%    Lipid Panel     Component Value Date/Time   CHOL 160 12/14/2021 1646   TRIG 174 (H) 12/14/2021 1646   HDL 29 (L) 12/14/2021 1646   CHOLHDL 5.5 (H) 12/14/2021 1646   LDLCALC 100 (H) 12/14/2021 1646    Clinical Atherosclerotic Cardiovascular Disease (ASCVD):  The 10-year ASCVD risk score (Arnett DK, et al., 2019) is: 13.2%   Values used to calculate the score:     Age: 31 years     Sex: Female     Is Non-Hispanic African American: Yes     Diabetic: Yes     Tobacco smoker: No     Systolic Blood Pressure: 121 mmHg     Is BP treated: No     HDL Cholesterol: 29 mg/dL     Total  Cholesterol: 160 mg/dL   A/P: Diabetes longstanding currently well controlled without any hypoglycemia since stopping insulin therapy.  She has tolerated higher dose of Ozempic (semaglutide). Patient is able to verbalize appropriate hypoglycemia management plan. Medication adherence was  concerning as patient expressed concern over lab relationship with Ozempic (semaglutide).  Concern for hypercalcemia was discussed with attending physician of the day, Dr. Miquel Dunn.  -Following discussion of referral to endocrinology - provided contact to referral office in [facility], patient was comfortable with the following plan.  -Instructed to continue with GLP-1 Ozempic (semaglutide) 0.5 mg weekly -No plan to start SGLT2 at this time due to current work-up of lab abnormality.   -Patient educated on purpose, proper use, and potential adverse effects verbalized understanding that her elevated calcium has been a longer-term abnormality and recent calcium lab elevation (April) is likely unrelated to Ozempic (semaglutide) (it was started in June).  -Extensively discussed pathophysiology of diabetes, recommended lifestyle interventions, dietary effects on blood sugar control.  -Counseled on s/sx of and management of hypoglycemia.  -Next A1c anticipated November or December.     Written patient instructions provided. Patient verbalized understanding of treatment plan.  Total time in face to face counseling 26 minutes.   Follow-up:  Pharmacist PRN PCP clinic visit in following visit to endocrinology

## 2022-10-22 NOTE — Assessment & Plan Note (Signed)
Diabetes longstanding currently well controlled without any hypoglycemia since stopping insulin therapy.  She has tolerated higher dose of Ozempic (semaglutide). Patient is able to verbalize appropriate hypoglycemia management plan. Medication adherence was  concerning as patient expressed concern over lab relationship with Ozempic (semaglutide).  Concern for hypercalcemia was discussed with attending physician of the day, Dr. Miquel Dunn.  -Following discussion of referral to endocrinology - provided contact to referral office in [facility], patient was comfortable with the following plan.  -Instructed to continue with GLP-1 Ozempic (semaglutide) 0.5 mg weekly -No plan to start SGLT2 at this time due to current work-up of lab abnormality.  -Patient educated on purpose, proper use, and potential adverse effects verbalized understanding that her elevated calcium has been a longer-term abnormality and recent calcium lab elevation (April) is likely unrelated to Ozempic (semaglutide) (started in June).  -Extensively discussed pathophysiology of diabetes, recommended lifestyle interventions, dietary effects on blood sugar control.

## 2022-10-22 NOTE — Patient Instructions (Addendum)
It was nice to see you today!  Your goal blood sugar is 80-130 before eating and less than 180 after eating.  Medication Changes:  Continue all other medication the same. Ozempic (semaglutide) 0.5mg  once weekly.  Remain OFF of your insulin at this time.   Monitor blood sugars at home and keep a log (glucometer or piece of paper) to bring with you to your next visit.  Keep up the good work with diet and exercise. Aim for a diet full of vegetables, fruit and lean meats (chicken, [place], fish). Try to limit salt intake by eating fresh or frozen vegetables (instead of canned), rinse canned vegetables prior to cooking and do not add any additional salt to meals.   We referred you to :  [facility] [address] [place],  [place]  [address] Get Driving Directions Main: [phone]  You can call them to check on the status of your referral for your elevated calcium.

## 2022-10-23 NOTE — Progress Notes (Signed)
Reviewed and agree with Dr Koval's plan.   

## 2022-10-26 ENCOUNTER — Other Ambulatory Visit: Payer: Self-pay | Admitting: Student

## 2022-12-13 ENCOUNTER — Other Ambulatory Visit: Payer: Self-pay

## 2022-12-13 MED ORDER — OZEMPIC (0.25 OR 0.5 MG/DOSE) 2 MG/3ML ~~LOC~~ SOPN: 0.2500 mg | PEN_INJECTOR | SUBCUTANEOUS | 1 refills | Status: DC

## 2022-12-25 ENCOUNTER — Other Ambulatory Visit: Payer: Self-pay | Admitting: Student

## 2023-01-11 ENCOUNTER — Ambulatory Visit
Admission: RE | Admit: 2023-01-11 | Discharge: 2023-01-11 | Disposition: A | Discharge status: Home / Self Care | Payer: Self-pay | Source: Ambulatory Visit | Attending: Family Medicine | Admitting: Family Medicine

## 2023-01-11 VITALS — BP 134/61 | HR 61 | Temp 98.2°F | Resp 16

## 2023-01-11 DIAGNOSIS — M79644 Pain in right finger(s): Secondary | ICD-10-CM

## 2023-01-11 MED ORDER — DEXAMETHASONE SODIUM PHOSPHATE 10 MG/ML IJ SOLN
10.0000 mg | Freq: Once | INTRAMUSCULAR | Status: AC
  Administered 2023-01-11: 10 mg via INTRAMUSCULAR

## 2023-01-11 NOTE — ED Triage Notes (Signed)
Pt c/o right thumb pain x 1 week. Pt denies any injury. She has tried OTC pain medication for relief.

## 2023-01-11 NOTE — Discharge Instructions (Addendum)
Watch your blood sugars closely as the medicine you've been given will cause them to go up.  Meds ordered this encounter  Medications   dexamethasone (DECADRON) injection 10 mg

## 2023-01-15 NOTE — ED Provider Notes (Signed)
[facility]   [id number] 01/11/23 Arrival Time: 0846  ASSESSMENT & PLAN:  1. Thumb pain, right    Thumb spica for comfort. Meds ordered this encounter  Medications   dexamethasone (DECADRON) injection 10 mg   F/U sports med.  Recommend:  Follow-up Information     [facility].   Why: If worsening or failing to improve as anticipated. Contact information: [address] [place] [address] [phone]                Reviewed expectations re: course of current medical issues. Questions answered. Outlined signs and symptoms indicating need for more acute intervention. Patient verbalized understanding. After Visit Summary given.  SUBJECTIVE: History from: patient. Elizabeth Andrade is a 62 y.o. female who reports Pt c/o right thumb pain x 1 week. Pt denies any injury. She has tried OTC pain medication for relief.    Past Surgical History:  Procedure Laterality Date   CHOLECYSTECTOMY     COLONOSCOPY     FLEXIBLE SIGMOIDOSCOPY N/A 11/14/2015   Procedure: FLEXIBLE SIGMOIDOSCOPY;  Surgeon: Ruffin Frederick, MD;  Location: [facility];  Service: Gastroenterology;  Laterality: N/A;      OBJECTIVE:  Vitals:   01/11/23 0923  BP: 134/61  Pulse: 61  Resp: 16  Temp: 98.2 F (36.8 C)  TempSrc: Oral  SpO2: 97%    General appearance: alert; no distress HEENT: [place]; AT Neck: supple with FROM Resp: unlabored respirations Extremities: RUE: warm with well perfused appearance; pain reported over radial side of wrist, more notable with thumb and wrist movement; no erythema or inflammation; no swelling; FROM; very tender over radial styloid CV: brisk extremity capillary refill of RUE; 2+ radial pulse of RUE. Skin: warm and dry; no visible rashes Neurologic: gait normal; normal sensation and strength of RUE Psychological: alert and cooperative; normal mood and affect     Allergies  Allergen Reactions    Metformin And Related Diarrhea    Patient unwillingness to take due to significant diarrhea.    Past Medical History:  Diagnosis Date   ABNORMAL PAP SMEAR, LGSIL 02/11/2007   Qualifier: Diagnosis of  By: Sandi Mealy  MD, Judeth Cornfield     Allergy    Anemia    Colitis    Crohn's colitis, without complications (HCC) 11/10/2015   Crohn's disease (HCC)    Diabetes mellitus without complication (HCC)    Diabetes mellitus, new onset (HCC) 02/28/2014   Oropharyngeal candidiasis 02/28/2014   Social History   Socioeconomic History   Marital status: Married    Spouse name: Not on file   Number of children: 5   Years of education: Not on file   Highest education level: Not on file  Occupational History   Occupation: Event organiser: [employer]  Tobacco Use   Smoking status: Former    Current packs/day: 0.00    Average packs/day: 0.5 packs/day for 37.7 years (18.9 ttl pk-yrs)    Types: Cigarettes    Start date: 01/29/1984    Quit date: 10/28/2021    Years since quitting: 1.2    Passive exposure: Current   Smokeless tobacco: Never   Tobacco comments:    Quit twice for extended periods of time (10 years and 8 years).  [place] smokers  Vaping Use   Vaping status: Never Used  Substance and Sexual Activity   Alcohol use: No    Alcohol/week: 0.0 standard drinks of alcohol   Drug  use: No    Comment: Marijuana in the past   Sexual activity: Not on file  Other Topics Concern   Not on file  Social History Narrative   Not on file   Social Drivers of Health   Financial Resource Strain: Not on file  Food Insecurity: Not on file  Transportation Needs: Not on file  Physical Activity: Not on file  Stress: Not on file  Social Connections: Not on file   Family History  Problem Relation Age of Onset   Colon cancer Maternal Grandmother    Breast cancer Other        aunt   Depression Other    Depression Brother        committed suicide in 43   Diabetes Mother     Hyperlipidemia Mother    Hypertension Mother    Diabetes Father    Hyperlipidemia Father    Hypertension Father    Arthritis Maternal Aunt    Esophageal cancer Neg Hx    Rectal cancer Neg Hx    Stomach cancer Neg Hx    Past Surgical History:  Procedure Laterality Date   CHOLECYSTECTOMY     COLONOSCOPY     FLEXIBLE SIGMOIDOSCOPY N/A 11/14/2015   Procedure: FLEXIBLE SIGMOIDOSCOPY;  Surgeon: Ruffin Frederick, MD;  Location: [facility];  Service: Gastroenterology;  Laterality: N/AMardella Layman, MD 01/15/23 1021

## 2023-02-18 ENCOUNTER — Ambulatory Visit: Payer: BC Managed Care – PPO | Admitting: Endocrinology

## 2023-03-06 ENCOUNTER — Ambulatory Visit: Payer: 59 | Admitting: Endocrinology

## 2023-04-12 ENCOUNTER — Other Ambulatory Visit: Payer: Self-pay | Admitting: Student

## 2023-04-29 ENCOUNTER — Ambulatory Visit: Payer: 59 | Admitting: Endocrinology

## 2023-04-29 ENCOUNTER — Other Ambulatory Visit: Payer: Self-pay

## 2023-04-29 ENCOUNTER — Encounter: Payer: Self-pay | Admitting: Endocrinology

## 2023-04-29 NOTE — Patient Instructions (Signed)
 Labs today.   24-hr Urine Collection: - Preferably to be done while staying at home and start in the morning.  - Discard the first urine of the morning after you wake up because that was the urine from overnight. DO NOT COLLECT FIRST URINE. Then begin 24-hour urine collection.  Collect your urine every time you pee for next 24 hours which means all day and overnight until next morning.  You collect the last urine next morning, that COMPLETES the collection.

## 2023-04-29 NOTE — Progress Notes (Signed)
 Outpatient Endocrinology Note Elizabeth Laquinta Hazell, MD   Patient's Name: Elizabeth Andrade    DOB: January 08, 1961    MRN: [id number]  REASON OF VISIT: New consult for hypercalcemia  REFERRING PROVIDER: Bess Kinds, MD  PCP: Bess Kinds, MD  HISTORY OF PRESENT ILLNESS:   Elizabeth Andrade is a 63 y.o. old female with past medical history listed below, is here for new consult of hypercalcemia.  Pertinent Calcium History: Patient has intermittent hypercalcemia, and referred to endocrinology for further evaluation and management.  With a chart review she has intermittent hypercalcemia at least from 2008/2011 timeframe, mostly mildly elevated and was significantly elevated up to 11.4 in March 2024.  She had normal calcium some of the times as well.  In April 2024 she had serum calcium of 11.1, albumin of 4.8 and corrected serum calcium of 10.5 with upper normal limit of 10.3, mildly elevated serum calcium.  Normal renal function.  Vitamin D was normal at 33.  PTH was checked was normal at 57 with upper normal limit of 65.  She had mildly low phosphorus in 2016 and normal magnesium in 2016.   Latest Reference Range & Units 05/23/22 17:38  Calcium 8.7 - 10.3 mg/dL 86.5 (H)  Albumin 3.9 - 4.9 g/dL 4.8  Vitamin D, 78-IONGEXB, Serum ng/mL 33  PTH, Intact 15 - 65 pg/mL 57  PTH Interp  Comment  (H): Data is abnormally high  -she has no history of symptomatic kidney stones, peptic ulcer disease, or pancreatitis.  - The patient denies treatment with hydrochlorotiazide or lithium.  - she has no history of thyroid disease, neck radiation or neck surgery. - The patient also has no history of clinically obvious fragility fractures. she does not carry the diagnosis of osteoporosis or osteopenia. she has not had a DXA scan in the past.  - [name] has no known family history of kidney stone, hyperparathyroidism, or endocrine neoplasms.  Patient reports mother has history of hypercalcemia, no treatment required and  being monitored. - Patient eats occasional milk and cheese, does not take calcium.  She takes magnesium/vitamin D3 300 mg / 25 mcg 2 tablets daily. She takes 1 multivitamin daily.  S  The patient denies nocturia. There is no clear history of polyuria or polydipsia. Denies constipation, recurrent nausea, or vomiting. [name] reports no fatigue, memory problems and mood fluctuations.     # Patient has type 2 diabetes mellitus with hemoglobin A1c of 6.8% in June 2024, managed by primary care provider currently taking Ozempic, Farxiga.   REVIEW OF SYSTEMS:  As per history of present illness.   PAST MEDICAL HISTORY: Past Medical History:  Diagnosis Date   ABNORMAL PAP SMEAR, LGSIL 02/11/2007   Qualifier: Diagnosis of  By: Sandi Mealy  MD, Stephanie     Allergy    Anemia    Colitis    Crohn's colitis, without complications (HCC) 11/10/2015   Crohn's disease (HCC)    Diabetes mellitus without complication (HCC)    Diabetes mellitus, new onset (HCC) 02/28/2014   Oropharyngeal candidiasis 02/28/2014    PAST SURGICAL HISTORY: Past Surgical History:  Procedure Laterality Date   CHOLECYSTECTOMY     COLONOSCOPY     FLEXIBLE SIGMOIDOSCOPY N/A 11/14/2015   Procedure: FLEXIBLE SIGMOIDOSCOPY;  Surgeon: Ruffin Frederick, MD;  Location: [facility];  Service: Gastroenterology;  Laterality: N/A;    ALLERGIES: Allergies  Allergen Reactions   Metformin And Related Diarrhea    Patient unwillingness to take due to significant diarrhea.  FAMILY HISTORY:  Family History  Problem Relation Age of Onset   Colon cancer Maternal Grandmother    Breast cancer Other        aunt   Depression Other    Depression Brother        committed suicide in 91   Diabetes Mother    Hyperlipidemia Mother    Hypertension Mother    Diabetes Father    Hyperlipidemia Father    Hypertension Father    Arthritis Maternal Aunt    Esophageal cancer Neg Hx    Rectal cancer Neg Hx    Stomach cancer Neg Hx     No known family history of hypercalcemia, kidney stone, hyperparathyroidism, or endocrine neoplasms.   SOCIAL HISTORY: Social History   Socioeconomic History   Marital status: Married    Spouse name: Not on file   Number of children: 5   Years of education: Not on file   Highest education level: Not on file  Occupational History   Occupation: Event organiser: [employer]  Tobacco Use   Smoking status: Former    Current packs/day: 0.00    Average packs/day: 0.5 packs/day for 37.7 years (18.9 ttl pk-yrs)    Types: Cigarettes    Start date: 01/29/1984    Quit date: 10/28/2021    Years since quitting: 1.5    Passive exposure: Current   Smokeless tobacco: Never   Tobacco comments:    Quit twice for extended periods of time (10 years and 8 years).  [place] smokers  Vaping Use   Vaping status: Never Used  Substance and Sexual Activity   Alcohol use: No    Alcohol/week: 0.0 standard drinks of alcohol   Drug use: No    Comment: Marijuana in the past   Sexual activity: Not on file  Other Topics Concern   Not on file  Social History Narrative   Not on file   Social Drivers of Health   Financial Resource Strain: Not on file  Food Insecurity: Not on file  Transportation Needs: Not on file  Physical Activity: Not on file  Stress: Not on file  Social Connections: Not on file    MEDICATIONS:  Current Outpatient Medications  Medication Sig Dispense Refill   glucose blood test strip Use to test blood sugars 3x daily. Dx Code: E11.9 100 each 12   Insulin Pen Needle 30G X 5 MM MISC Dispense QS for 1 daily injection plus 1 weekly injection 100 each 11   Semaglutide,0.25 or 0.5MG /DOS, (OZEMPIC, 0.25 OR 0.5 MG/DOSE,) 2 MG/3ML SOPN Inject 0.25 mg into the skin once a week. 3 mL 1   sertraline (ZOLOFT) 100 MG tablet TAKE 1 TABLET(100 MG) BY MOUTH DAILY 30 tablet 5   acetaminophen (TYLENOL) 500 MG tablet Take 1,000 mg by mouth every 6 (six) hours as needed for mild pain.  (Patient not taking: Reported on 04/29/2023)     Coenzyme Q10 (COQ-10 PO) Take 1 tablet by mouth every other day. (Patient not taking: Reported on 04/29/2023)     dapagliflozin propanediol (FARXIGA) 5 MG TABS tablet Take 1 tablet (5 mg total) by mouth daily. (Patient not taking: Reported on 04/29/2023) 90 tablet 3   Multiple Vitamin (MULTIVITAMIN WITH MINERALS) TABS tablet Take 1 tablet by mouth daily. (Patient not taking: Reported on 04/29/2023)     rosuvastatin (CRESTOR) 20 MG tablet TAKE 1 TABLET(20 MG) BY MOUTH DAILY (Patient not taking: Reported on 04/29/2023) 30 tablet 2   Current  Facility-Administered Medications  Medication Dose Route Frequency Provider Last Rate Last Admin   0.9 %  sodium chloride infusion  500 mL Intravenous Continuous Danis, Starr Lake III, MD        PHYSICAL EXAM: Vitals:   04/29/23 1436  BP: 128/76  Pulse: 74  Resp: 16  SpO2: 96%  Weight: 171 lb (77.6 kg)  Height: 5\' 6"  (1.676 m)   Body mass index is 27.6 kg/m.    General: Well developed, well nourished female in no apparent distress.  HEENT: AT/[place], no external lesions. Hearing intact to the spoken word Eyes: EOMI. Conjunctiva clear and no icterus. Neck: Trachea midline, neck supple without appreciable thyromegaly or lymphadenopathy and no palpable thyroid nodules Lungs: Clear to auscultation, no wheeze. Respirations not labored Heart: S1S2, Regular in rate and rhythm. No loud murmurs Abdomen: Soft, non tender, non distended Neurologic: Alert, oriented, normal speech, deep tendon biceps reflexes normal,  no gross focal neurological deficit Extremities: No pedal pitting edema, no tremors of outstretched hands Skin: Warm, color good.  Psychiatric: Does not appear depressed or anxious  PERTINENT HISTORIC LABORATORY AND IMAGING STUDIES:  All pertinent laboratory results were reviewed. Please see HPI also for further details.   ASSESSMENT / PLAN  1. Hypercalcemia    -Patient has intermittent hypercalcemia, per  chart review at least from 2008 timeframe.  Mostly mildly elevated serum calcium, in April 2022 she had serum calcium of 11.4 with albumin of 4.8 and corrected serum calcium of 10.5 with upper normal limit of 10.3, mildly elevated, PTH at that time was 57, in the high normal range and vitamin D was normal 33, normal renal function.  At times she had normal serum calcium as well. -Etiology of hypercalcemia is there is a possibility of primary hyperparathyroidism.  She has mother with hypercalcemia.  As she has normal serum calcium at times less likely to be familial hypocalciuric hypercalcemia. -She needs further laboratory evaluation for hypercalcemia. If any of these tests are concerning, we will discuss with patient regarding parathyroidectomy, which is the permanent treatment. If surgery is indicated, will plan for localization studies with NM parathyroid scan with planar / SPECT and a neck ultrasound.   Plan: -I would like to check BMP with EGFR with magnesium, phosphorus, ionized calcium, albumin. -Check parathyroid hormone level, vitamin D level -I would also like to check thyroid function test.  She had normal thyroid function test in the past. - Check 24 hr urine calcium and creatinine -Consider DXA scan with distal radius  -Rest of the management plan after the test results.  Diagnoses and all orders for this visit:  Hypercalcemia -     Basic Metabolic Panel Without GFR -     Albumin -     Magnesium -     VITAMIN D 25 Hydroxy (Vit-D Deficiency, Fractures) -     Cancel: Calcium, 24-Hour Urine with Creatinine; Future -     T4, free -     TSH -     Parathyroid hormone, intact (no Ca) -     Calcium, ionized -     Phosphorus -     Calcium, 24-Hour Urine with Creatinine    DISPOSITION Follow up in clinic in 4 weeks suggested.  All questions answered and patient verbalized understanding of the plan.  Elizabeth Ilyaas Musto, MD [facility] Endocrinology [facility] [address]  [place], [address] [place], [place] [address] Phone # [phone]  At least part of this note was generated using voice  recognition software. Inadvertent word errors may have occurred, which were not recognized during the proofreading process.

## 2023-04-30 LAB — BASIC METABOLIC PANEL WITHOUT GFR
BUN: 21 mg/dL (ref 7–25)
CO2: 30 mmol/L (ref 20–32)
Calcium: 11.9 mg/dL — ABNORMAL HIGH (ref 8.6–10.4)
Chloride: 104 mmol/L (ref 98–110)
Creat: 0.88 mg/dL (ref 0.50–1.05)
Glucose, Bld: 89 mg/dL (ref 65–99)
Potassium: 4.7 mmol/L (ref 3.5–5.3)
Sodium: 143 mmol/L (ref 135–146)

## 2023-04-30 LAB — TSH: TSH: 1.14 m[IU]/L (ref 0.40–4.50)

## 2023-04-30 LAB — ALBUMIN: Albumin: 4.7 g/dL (ref 3.6–5.1)

## 2023-04-30 LAB — T4, FREE: Free T4: 1.1 ng/dL (ref 0.8–1.8)

## 2023-04-30 LAB — PARATHYROID HORMONE, INTACT (NO CA): PTH: 65 pg/mL (ref 16–77)

## 2023-04-30 LAB — CALCIUM, IONIZED: Calcium, Ion: 6 mg/dL — ABNORMAL HIGH (ref 4.7–5.5)

## 2023-04-30 LAB — MAGNESIUM: Magnesium: 2.1 mg/dL (ref 1.5–2.5)

## 2023-04-30 LAB — PHOSPHORUS: Phosphorus: 3.4 mg/dL (ref 2.5–4.5)

## 2023-04-30 LAB — VITAMIN D 25 HYDROXY (VIT D DEFICIENCY, FRACTURES): Vit D, 25-Hydroxy: 29 ng/mL — ABNORMAL LOW (ref 30–100)

## 2023-05-01 ENCOUNTER — Encounter: Payer: Self-pay | Admitting: Endocrinology

## 2023-05-01 ENCOUNTER — Telehealth: Payer: Self-pay

## 2023-05-01 NOTE — Telephone Encounter (Signed)
 VM left requesting call back to give lab results as directed by MD.

## 2023-05-01 NOTE — Telephone Encounter (Signed)
-----   Message from [place] Thapa sent at 05/01/2023  3:04 PM EDT ----- Please notify patient of labs reviewed corrected serum calcium is 11.3 with serum calcium of 11.9 and albumin of 4.7.  PTH is high normal 65, ionized calcium is elevated, phosphorus normal.  Overall concerning for primary hyperparathyroidism.  Thyroid function test.  24-hour urine calcium awaited.  Will make a further plan after the 24-hour urine calcium result.

## 2023-06-17 ENCOUNTER — Other Ambulatory Visit: Payer: Self-pay | Admitting: Student

## 2023-06-18 ENCOUNTER — Ambulatory Visit: Admitting: Endocrinology

## 2023-06-18 ENCOUNTER — Encounter: Payer: Self-pay | Admitting: Endocrinology

## 2023-06-18 NOTE — Patient Instructions (Signed)
  24-hr Urine Collection: - Preferably to be done while staying at home and start in the morning.  - Discard the first urine of the morning after you wake up because that was the urine from overnight. DO NOT COLLECT FIRST URINE. Then begin 24-hour urine collection.  Collect your urine every time you pee for next 24 hours which means all day and overnight until next morning.  You collect the last urine next morning, that COMPLETES the collection.

## 2023-06-18 NOTE — Progress Notes (Signed)
 Outpatient Endocrinology Note [place] Raesha Coonrod, MD   Patient's Name: Elizabeth Andrade    DOB: [birth year]    MRN: [id number]  REASON OF VISIT: Follow-up for hypercalcemia  REFERRING PROVIDER: Wilhemena Harbour, MD  PCP: Wilhemena Harbour, MD  HISTORY OF PRESENT ILLNESS:   Elizabeth Andrade is a 63 y.o. old female with past medical history listed below, is here for follow-up of hypercalcemia.  Pertinent Calcium  History: Initial consult in April 2025.  Patient has intermittent hypercalcemia, and referred to endocrinology for further evaluation and management.  With a chart review she has intermittent hypercalcemia at least from 2008/2011 timeframe, mostly mildly elevated and was significantly elevated up to 11.4 in March 2024.  She had normal calcium  some of the times as well.  In April 2024 she had serum calcium  of 11.1, albumin of 4.8 and corrected serum calcium  of 10.5 with upper normal limit of 10.3, mildly elevated serum calcium .  Normal renal function.  Vitamin D  was normal at 33.  PTH was checked was normal at 57 with upper normal limit of 65.  She had mildly low phosphorus in 2016 and normal magnesium  in 2016.   Latest Reference Range & Units 05/23/22 17:38  Calcium  8.7 - 10.3 mg/dL 40.9 (H)  Albumin 3.9 - 4.9 g/dL 4.8  Vitamin D , 25-Hydroxy, Serum ng/mL 33  PTH, Intact 15 - 65 pg/mL 57  PTH Interp  Comment  (H): Data is abnormally high  -she has no history of symptomatic kidney stones, peptic ulcer disease, or pancreatitis.  - The patient denies treatment with hydrochlorotiazide or lithium.  - she has no history of thyroid disease, neck radiation or neck surgery. - The patient also has no history of clinically obvious fragility fractures. she does not carry the diagnosis of osteoporosis or osteopenia. she has not had a DXA scan in the past.  - Elizabeth Andrade has no known family history of kidney stone, hyperparathyroidism, or endocrine neoplasms.  Patient reports mother has history of  hypercalcemia, no treatment required and being monitored. - Patient eats occasional milk and cheese, does not take calcium .  She takes magnesium /vitamin D3 300 mg / 25 mcg 2 tablets daily. She takes 1 multivitamin daily.  S  The patient denies nocturia. There is no clear history of polyuria or polydipsia. Denies constipation, recurrent nausea, or vomiting. Elizabeth Andrade reports no fatigue, memory problems and mood fluctuations.     Laboratory evaluation on April 29, 2023 : labs reviewed corrected serum calcium  is 11.3 with serum calcium  of 11.9 and albumin of 4.7.  PTH is high normal 65, ionized calcium  is elevated, phosphorus normal.  Overall concerning for primary hyperparathyroidism.     # Patient has type 2 diabetes mellitus with hemoglobin A1c of 6.8% in June 2024, managed by primary care provider currently taking Ozempic , Farxiga .   Interval history: Patient had laboratory evaluation for elevated calcium  in last month, continued to have elevated serum calcium  with hide normal PTH concerning for primary hyperparathyroidism.  Patient has not completed 24-hour urine calcium  test.  Patient has no new symptoms.   REVIEW OF SYSTEMS:  As per history of present illness.   PAST MEDICAL HISTORY: Past Medical History:  Diagnosis Date   ABNORMAL PAP SMEAR, LGSIL 02/11/2007   Qualifier: Diagnosis of  By: Joni Net  MD, Trevor Fudge     Allergy    Anemia    Colitis    Crohn's colitis, without complications (HCC) 11/10/2015   Crohn's disease (HCC)    Diabetes mellitus without  complication (HCC)    Diabetes mellitus, new onset (HCC) 02/28/2014   Oropharyngeal candidiasis 02/28/2014    PAST SURGICAL HISTORY: Past Surgical History:  Procedure Laterality Date   CHOLECYSTECTOMY     COLONOSCOPY     FLEXIBLE SIGMOIDOSCOPY N/A 11/14/2015   Procedure: FLEXIBLE SIGMOIDOSCOPY;  Surgeon: Danette Duos, MD;  Location: [facility];  Service: Gastroenterology;  Laterality: N/A;    ALLERGIES: Allergies   Allergen Reactions   Metformin  And Related Diarrhea    Patient unwillingness to take due to significant diarrhea.    FAMILY HISTORY:  Family History  Problem Relation Age of Onset   Colon cancer Maternal Grandmother    Breast cancer Other        aunt   Depression Other    Depression Brother        committed suicide in 39   Diabetes Mother    Hyperlipidemia Mother    Hypertension Mother    Diabetes Father    Hyperlipidemia Father    Hypertension Father    Arthritis Maternal Aunt    Esophageal cancer Neg Hx    Rectal cancer Neg Hx    Stomach cancer Neg Hx    No known family history of hypercalcemia, kidney stone, hyperparathyroidism, or endocrine neoplasms.   SOCIAL HISTORY: Social History   Socioeconomic History   Marital status: Married    Spouse name: Not on file   Number of children: 5   Years of education: Not on file   Highest education level: Not on file  Occupational History   Occupation: Event organiser: [employer]  Tobacco Use   Smoking status: Former    Current packs/day: 0.00    Average packs/day: 0.5 packs/day for 37.7 years (18.9 ttl pk-yrs)    Types: Cigarettes    Start date: 01/29/1984    Quit date: 10/28/2021    Years since quitting: 1.6    Passive exposure: Current   Smokeless tobacco: Never   Tobacco comments:    Quit twice for extended periods of time (10 years and 8 years).  [place] smokers  Vaping Use   Vaping status: Never Used  Substance and Sexual Activity   Alcohol use: No    Alcohol/week: 0.0 standard drinks of alcohol   Drug use: No    Comment: Marijuana in the past   Sexual activity: Not on file  Other Topics Concern   Not on file  Social History Narrative   Not on file   Social Drivers of Health   Financial Resource Strain: Not on file  Food Insecurity: Not on file  Transportation Needs: Not on file  Physical Activity: Not on file  Stress: Not on file  Social Connections: Not on file    MEDICATIONS:   Current Outpatient Medications  Medication Sig Dispense Refill   acetaminophen  (TYLENOL ) 500 MG tablet Take 1,000 mg by mouth every 6 (six) hours as needed for mild pain (pain score 1-3).     Coenzyme Q10 (COQ-10 PO) Take 1 tablet by mouth every other day.     dapagliflozin  propanediol (FARXIGA ) 5 MG TABS tablet Take 1 tablet (5 mg total) by mouth daily. 90 tablet 3   glucose blood test strip Use to test blood sugars 3x daily. Dx Code: E11.9 100 each 12   Insulin  Pen Needle 30G X 5 MM MISC Dispense QS for 1 daily injection plus 1 weekly injection 100 each 11   Multiple Vitamin (MULTIVITAMIN WITH MINERALS) TABS tablet Take 1 tablet  by mouth daily.     rosuvastatin  (CRESTOR ) 20 MG tablet TAKE 1 TABLET(20 MG) BY MOUTH DAILY 30 tablet 2   Semaglutide ,0.25 or 0.5MG /DOS, (OZEMPIC , 0.25 OR 0.5 MG/DOSE,) 2 MG/3ML SOPN INJECT 0.25MG  INTO THE SKIN ONCE A WEEK 3 mL 2   sertraline  (ZOLOFT ) 100 MG tablet TAKE 1 TABLET(100 MG) BY MOUTH DAILY 30 tablet 5   Current Facility-Administered Medications  Medication Dose Route Frequency Provider Last Rate Last Admin   0.9 %  sodium chloride  infusion  500 mL Intravenous Continuous Danis, Roel Clarity III, MD        PHYSICAL EXAM: Vitals:   06/18/23 1610  BP: 122/70  Pulse: 72  Resp: 20  SpO2: 97%  Weight: 179 lb 12.8 oz (81.6 kg)  Height: 5\' 6"  (1.676 m)    Body mass index is 29.02 kg/m.    General: Well developed, well nourished female in no apparent distress.  HEENT: AT/[place], no external lesions. Hearing intact to the spoken word Eyes: EOMI. Conjunctiva clear and no icterus. Neck: Trachea midline, neck supple without appreciable thyromegaly or lymphadenopathy and no palpable thyroid nodules Lungs: Clear to auscultation, no wheeze. Respirations not labored Heart: S1S2, Regular in rate and rhythm. No loud murmurs Abdomen: Soft, non tender, non distended Neurologic: Alert, oriented, normal speech, deep tendon biceps reflexes normal,  no gross focal  neurological deficit Extremities: No pedal pitting edema, no tremors of outstretched hands Skin: Warm, color good.  Psychiatric: Does not appear depressed or anxious  PERTINENT HISTORIC LABORATORY AND IMAGING STUDIES:  All pertinent laboratory results were reviewed. Please see HPI also for further details.   ASSESSMENT / PLAN  1. Hypercalcemia     -Patient has intermittent hypercalcemia, per chart review at least from 2008 timeframe.  Mostly mildly elevated serum calcium , in April 2022 she had serum calcium  of 11.4 with albumin of 4.8 and corrected serum calcium  of 10.5 with upper normal limit of 10.3, mildly elevated, PTH at that time was 57, in the high normal range and vitamin D  was normal 33, normal renal function.  At times she had normal serum calcium  as well. -Etiology of hypercalcemia is there is a possibility of primary hyperparathyroidism.  She has mother with hypercalcemia.  As she has normal serum calcium  at times less likely to be familial hypocalciuric hypercalcemia. -Laboratory evaluation in April with corrected serum calcium  of 11.3 and PTH 65 concerning for primary hyperparathyroidism.  Plan: -Check 24-hour urine calcium . -Will plan for parathyroid  localized on imaging studies with ultrasound thyroid and sestamibi parathyroid  nuclear scan if 24 urine calcium  is elevated. -Consider DXA scan with distal radius  -Rest of the management plan after the test results.  Diagnoses and all orders for this visit:  Hypercalcemia -     Calcium , 24-Hour Urine with Creatinine   DISPOSITION Follow up in clinic in to be determined based on test result.  All questions answered and patient verbalized understanding of the plan.  [place] Aldridge Krzyzanowski, MD [facility] Endocrinology [facility] [address] [place], [address] [place], [place] [address] Phone # [phone]  At least part of this note was generated using voice recognition software. Inadvertent word errors may have occurred,  which were not recognized during the proofreading process.

## 2023-06-25 LAB — CALCIUM, 24-HOUR URINE WITH CREATININE
CALCIUM/CREATININE RATIO: 251 mg/g{creat} (ref 30–275)
Calcium, 24H Urine: 337 mg/(24.h) — ABNORMAL HIGH
Creatinine, 24H Ur: 1.34 g/(24.h) (ref 0.50–2.15)

## 2023-06-26 ENCOUNTER — Ambulatory Visit: Payer: Self-pay | Admitting: Endocrinology

## 2023-07-08 ENCOUNTER — Encounter: Payer: Self-pay | Admitting: *Deleted

## 2023-09-12 ENCOUNTER — Ambulatory Visit: Admitting: Orthopaedic Surgery

## 2023-09-23 ENCOUNTER — Ambulatory Visit: Admitting: Orthopaedic Surgery

## 2023-10-22 ENCOUNTER — Other Ambulatory Visit: Payer: Self-pay

## 2023-10-22 MED ORDER — SERTRALINE HCL 100 MG PO TABS: 100.0000 mg | ORAL_TABLET | Freq: Every day | ORAL | 1 refills | Status: DC

## 2023-10-23 NOTE — Telephone Encounter (Signed)
 Called and left a voice mail for the patient to contact us  back to schedule an appt.

## 2023-11-25 ENCOUNTER — Ambulatory Visit (INDEPENDENT_AMBULATORY_CARE_PROVIDER_SITE_OTHER): Admitting: Family Medicine

## 2023-11-25 ENCOUNTER — Encounter: Payer: Self-pay | Admitting: Family Medicine

## 2023-11-25 VITALS — BP 136/84 | HR 66 | Ht 65.0 in | Wt 175.8 lb

## 2023-11-25 DIAGNOSIS — F418 Other specified anxiety disorders: Secondary | ICD-10-CM

## 2023-11-25 DIAGNOSIS — Z23 Encounter for immunization: Secondary | ICD-10-CM

## 2023-11-25 DIAGNOSIS — E119 Type 2 diabetes mellitus without complications: Secondary | ICD-10-CM | POA: Diagnosis not present

## 2023-11-25 LAB — POCT GLYCOSYLATED HEMOGLOBIN (HGB A1C): HbA1c, POC (controlled diabetic range): 6.3 % (ref 0.0–7.0)

## 2023-11-25 MED ORDER — SERTRALINE HCL 100 MG PO TABS: 100.0000 mg | ORAL_TABLET | Freq: Every day | ORAL | 0 refills | Status: AC

## 2023-11-25 NOTE — Assessment & Plan Note (Addendum)
 2 months since son passed away unexpectedly. Appears that patient is experiencing an excepted/normal level of grief. No signs of SI. Encouraged her to restart talk therapy. Agreeable to continuing current dose of Zoloft  and following up with me in 6-8 weeks.  - sertraline  (ZOLOFT ) 100 MG tablet; Take 1 tablet (100 mg total) by mouth daily. Please schedule appointment prior to next refill  Dispense: 90 tablet; Refill: 0 - provided [facility] information in case of mental health crisis

## 2023-11-25 NOTE — Progress Notes (Signed)
    SUBJECTIVE:   CHIEF COMPLAINT / HPI:   Depression and grief Son passed away unexpectedly in late August Has been taking Zoloft  100mg  daily Finding a new therapist now  Has lots of family in the area- 4 children, 9 grandchildren Feels her mood is improving gradually. Has been sleeping more than usual but now able to get out of the house and keep her head up much more than in the few weeks following his passing No thoughts of self harm  DM2 Has not been taking statin for 1 year- was taking crestor  20mg  Taking Ozempic   Not taking Farxiga  anymore  PERTINENT  PMH / PSH: TM2, depression  OBJECTIVE:   BP 136/84   Pulse 66   Ht 5' 5 (1.651 m)   Wt 175 lb 12.8 oz (79.7 kg)   SpO2 96%   BMI 29.25 kg/m   - General: No acute distress. Awake and conversant. - Eyes: Normal conjunctiva, anicteric. Round symmetric pupils. - ENT: Hearing grossly intact. No nasal discharge. - Neck: Neck is supple. No masses or thyromegaly. - Respiratory: Respirations are non-labored. - Skin: No visible rashes or ulcers. - Psych: Alert and oriented. Cooperative, Appropriate mood and affect, Normal judgment. - MSK: Normal ambulation. - Neuro: Sensation and CN II-XII grossly normal.   ASSESSMENT/PLAN:   Assessment & Plan Type 2 diabetes mellitus without complication, without long-term current use of insulin  (HCC) Well-controlled with A1c 6.3 today, improved from 6.8 in June 24.  Also checking MCR today. Patient self discontinued statin about a year ago.  Will check lipid panel and discuss reinitiating statin if indicated. - Lipid panel - Microalbumin/Creatinine Ratio, Urine Depression with anxiety 2 months since son passed away unexpectedly. Appears that patient is experiencing an excepted/normal level of grief. No signs of SI. Encouraged her to restart talk therapy. Agreeable to continuing current dose of Zoloft  and following up with me in 6-8 weeks.  - sertraline  (ZOLOFT ) 100 MG tablet; Take 1  tablet (100 mg total) by mouth daily. Please schedule appointment prior to next refill  Dispense: 90 tablet; Refill: 0 - provided [facility] information in case of mental health crisis     Rea Raring, MD [facility]

## 2023-11-25 NOTE — Assessment & Plan Note (Addendum)
 Well-controlled with A1c 6.3 today, improved from 6.8 in June 24.  Also checking MCR today. Patient self discontinued statin about a year ago.  Will check lipid panel and discuss reinitiating statin if indicated. - Lipid panel - Microalbumin/Creatinine Ratio, Urine

## 2023-11-25 NOTE — Patient Instructions (Signed)
 Thank you for coming in today! Here is a summary of what we discussed:  -Your A1c looks great today- keep up the good work!  -Please make a follow up appointment in 6-8 weeks so we can check back in  -We are checking some labs today. If they are abnormal, I will call you. If they are normal, I will send you a MyChart message (if it is active) or a letter in the mail. If you do not hear about your labs in the next 2 weeks, please call the office.   -If you find yourself feeling very down or having any sort of mental health crisis, please seek help at the [facility] urgent care:   [facility]  [address], [place], [place] [address] [phone] or [phone] WALK-IN URGENT CARE 24/7 FOR ANYONE [address], [place], [place]  [phone] Fax: [phone] [email or web address] *Interpreters available *Accepts all insurance and uninsured for Urgent Care needs *Accepts Medicaid and uninsured for outpatient treatment     ONLY FOR [facility]  New patient assessment and therapy walk-ins Mondays and Wednesdays 8am-11am First and second Fridays 1pm-5pm  New patient psychiatry and medication management walk-ins:  Mondays, Wednesdays, Thursdays, Fridays 8am -11am NO PSYCHIATRY WALK-INS on TUESDAYS    Please call the clinic at [phone] if your symptoms worsen or you have any concerns.  Best, Dr Adele

## 2023-11-26 LAB — LIPID PANEL
Chol/HDL Ratio: 3.6 ratio (ref 0.0–4.4)
Cholesterol, Total: 223 mg/dL — ABNORMAL HIGH (ref 100–199)
HDL: 62 mg/dL (ref >39)
LDL Chol Calc (NIH): 139 mg/dL — ABNORMAL HIGH (ref 0–99)
Triglycerides: 123 mg/dL (ref 0–149)
VLDL Cholesterol Cal: 22 mg/dL (ref 5–40)

## 2023-11-26 LAB — MICROALBUMIN / CREATININE URINE RATIO
Creatinine, Urine: 211.9 mg/dL
Microalb/Creat Ratio: 21 mg/g{creat} (ref 0–29)
Microalbumin, Urine: 44.1 ug/mL

## 2023-11-27 ENCOUNTER — Ambulatory Visit: Payer: Self-pay | Admitting: Family Medicine

## 2023-11-27 DIAGNOSIS — E1169 Type 2 diabetes mellitus with other specified complication: Secondary | ICD-10-CM

## 2023-11-27 MED ORDER — ROSUVASTATIN CALCIUM 20 MG PO TABS
20.0000 mg | ORAL_TABLET | Freq: Every day | ORAL | 3 refills | Status: AC
Start: 2023-11-27 — End: ?

## 2023-12-01 ENCOUNTER — Encounter: Payer: Self-pay | Admitting: Radiology

## 2023-12-24 ENCOUNTER — Ambulatory Visit: Admitting: Family Medicine

## 2024-01-01 ENCOUNTER — Ambulatory Visit: Admitting: Family Medicine

## 2024-01-13 ENCOUNTER — Ambulatory Visit: Admitting: Family Medicine

## 2024-01-13 ENCOUNTER — Encounter: Payer: Self-pay | Admitting: Family Medicine

## 2024-01-13 VITALS — BP 131/71 | HR 68 | Ht 66.0 in | Wt 174.8 lb

## 2024-01-13 DIAGNOSIS — E119 Type 2 diabetes mellitus without complications: Secondary | ICD-10-CM

## 2024-01-13 DIAGNOSIS — H9312 Tinnitus, left ear: Secondary | ICD-10-CM

## 2024-01-13 DIAGNOSIS — G44219 Episodic tension-type headache, not intractable: Secondary | ICD-10-CM

## 2024-01-13 DIAGNOSIS — F418 Other specified anxiety disorders: Secondary | ICD-10-CM

## 2024-01-13 DIAGNOSIS — E1169 Type 2 diabetes mellitus with other specified complication: Secondary | ICD-10-CM

## 2024-01-13 DIAGNOSIS — E785 Hyperlipidemia, unspecified: Secondary | ICD-10-CM | POA: Diagnosis not present

## 2024-01-13 MED ORDER — OZEMPIC (0.25 OR 0.5 MG/DOSE) 2 MG/3ML ~~LOC~~ SOPN: 0.5000 mg | PEN_INJECTOR | SUBCUTANEOUS | 2 refills | Status: AC

## 2024-01-13 NOTE — Assessment & Plan Note (Signed)
 Continues to grieve her son's unexpected passing a few months ago.  No SI or thoughts of self-harm currently and has good family/friends support.  States the Zoloft  is helping, will look into counseling resources.  Advised follow-up if symptoms worsen or change.

## 2024-01-13 NOTE — Patient Instructions (Addendum)
 Thank you for coming in today! Here is a summary of what we discussed:  -Please schedule a diabetic eye exam with your optometrist  -I placed a referral for the podiatrist- you should hear something in a few weeks  -Please look into a counselor if you feel this would be helpful  -Start the higher dose of ozempic  weekly, please follow up with me in 1 month to check in   -You can alternate Tylenol  and Ibuprofen (motrin, aleve, etc) as needed for your headaches. Please try to only use these medicines 2-3 a week as using them more can make headaches worse  We are checking some labs today. If they are abnormal, I will call you. If they are normal, I will send you a MyChart message (if it is active) or a letter in the mail. If you do not hear about your labs in the next 2 weeks, please call the office.   Please call the clinic at [phone] if your symptoms worsen or you have any concerns.  Best, Dr Adele

## 2024-01-13 NOTE — Progress Notes (Signed)
° ° °  SUBJECTIVE:   CHIEF COMPLAINT / HPI:   F/u DM2 --seen on 10/28 --restarted statin after last visit, taking daily --medications: ozempic  0.25, has not noticed weight change --A1c 6.3  F/u depression, grief --son passed away a few months ago --started zoloft  100mg  at last visit, feels it calms her down --therapy- her mom found a counselor through [employer] who is free --family does not want to talk about it much, still processing --spending holidays with family --no SI, thoughts of self harm  Tinnitus --describes as static noise in ears --saw audiologist- lost high pitch tone --hearing aids very expensive, would wait until the new year and might find cheaper option --takes Excedrin 5 days a week, two tablets per time (250mg  tylenol , 250mg  aspirin, 65mg  caffeine).    PERTINENT  PMH / PSH: reviewed  OBJECTIVE:   BP 131/71   Pulse 68   Ht 5' 6 (1.676 m)   Wt 174 lb 12.8 oz (79.3 kg)   SpO2 96%   BMI 28.21 kg/m   - General: No acute distress. Awake and conversant. - Eyes: Normal conjunctiva, anicteric. Round symmetric pupils. - ENT: Hearing grossly intact. No nasal discharge. - Neck: Neck is supple. No masses or thyromegaly. - Respiratory: Respirations are non-labored. - Skin: No visible rashes or ulcers. - Psych: Alert and oriented. Cooperative, Appropriate mood and affect, Normal judgment. - MSK: Normal ambulation. - Neuro: Sensation and CN II-XII grossly normal.  ASSESSMENT/PLAN:   Assessment & Plan Type 2 diabetes mellitus without complication, without long-term current use of insulin  (HCC) Hyperlipidemia associated with type 2 diabetes mellitus (HCC) A1c collected at last visit was 6.3.  Patient has been taking 0.25 mg Ozempic  weekly, states her weight has not decreased.  Will increase to 0.5 mg Ozempic  weekly and follow-up in a few weeks to ensure tolerance.  Sent referral to podiatry.  Patient has an optometrist, will schedule appointment as  able. Depression with anxiety Continues to grieve her son's unexpected passing a few months ago.  No SI or thoughts of self-harm currently and has good family/friends support.  States the Zoloft  is helping, will look into counseling resources.  Advised follow-up if symptoms worsen or change. Tinnitus of left ear Episodic tension-type headache, not intractable Patient has been evaluated by audiology and was advised to get hearing aids, cost was a barrier to this.  Patient will look into this again in the new year.  Likely that this is contributing to headaches.  Advised decreasing frequency of OTC analgesia to avoid medication overuse headache.  Reviewed return precautions.     Rea Raring, MD [facility]

## 2024-01-13 NOTE — Assessment & Plan Note (Signed)
 A1c collected at last visit was 6.3.  Patient has been taking 0.25 mg Ozempic  weekly, states her weight has not decreased.  Will increase to 0.5 mg Ozempic  weekly and follow-up in a few weeks to ensure tolerance.  Sent referral to podiatry.  Patient has an optometrist, will schedule appointment as able.

## 2024-01-14 ENCOUNTER — Other Ambulatory Visit (HOSPITAL_COMMUNITY): Payer: Self-pay

## 2024-02-10 ENCOUNTER — Ambulatory Visit: Payer: Self-pay | Admitting: Family Medicine

## 2024-02-10 ENCOUNTER — Encounter: Payer: Self-pay | Admitting: Family Medicine

## 2024-02-10 VITALS — BP 124/69 | HR 62 | Ht 66.0 in | Wt 173.0 lb

## 2024-02-10 DIAGNOSIS — G44219 Episodic tension-type headache, not intractable: Secondary | ICD-10-CM | POA: Diagnosis not present

## 2024-02-10 DIAGNOSIS — E119 Type 2 diabetes mellitus without complications: Secondary | ICD-10-CM

## 2024-02-10 DIAGNOSIS — F418 Other specified anxiety disorders: Secondary | ICD-10-CM | POA: Diagnosis not present

## 2024-02-10 DIAGNOSIS — H9312 Tinnitus, left ear: Secondary | ICD-10-CM | POA: Diagnosis not present

## 2024-02-10 NOTE — Patient Instructions (Addendum)
 Thank you for coming in today! Here is a summary of what we discussed:  -please schedule an diabetic eye exam and a foot exam  -follow up with me in 1 month to check in about the higher dose of ozempic   -let me know if the headaches and tinnitus are getting worse or you have new symptoms  -you will be due for a mammogram in Feb, we can order this at your next appt  Please call the clinic at [phone] if your symptoms worsen or you have any concerns.  Best, Dr Adele

## 2024-02-10 NOTE — Progress Notes (Unsigned)
" ° ° °  SUBJECTIVE:   CHIEF COMPLAINT / HPI:   Headaches --improved significantly with OTC aleve, takes this a few times a week  Tinnitus --has not looked into hearing aids yet --feels it improves with the Aleve --has made some dietary changes and lost weight, feels this has helped --Diagnosed with asymmetric tinnitus and slight asymmetry in hearing by audiology in February 2024 --Advised to follow-up with ENT, appears patient never scheduled an appointment with them   Type 2 diabetes --Inc ozempic  to 0.5mg  at last visit --still using the 0.25mg  injections, will start the 0.5mg  next week -- Up-to-date on A1c, lipid panel, urine microalbumin creatinine ratio  Depression/anxiety --son passed away in Fall 2025, zoloft  100mg  since 10/25 --feels better when active --Enjoys being at work, has good support --no SI or thoughts of self harm  PERTINENT  PMH / PSH: Reviewed  OBJECTIVE:   BP 124/69   Pulse 62   Ht 5' 6 (1.676 m)   Wt 173 lb (78.5 kg)   SpO2 98%   BMI 27.92 kg/m   - General: No acute distress. Awake and conversant. - Eyes: Normal conjunctiva, anicteric. Round symmetric pupils. - ENT: Hearing grossly intact. No nasal discharge. - Neck: Neck is supple. No masses or thyromegaly. - Respiratory: Respirations are non-labored. - Skin: No visible rashes or ulcers. - Psych: Alert and oriented. Cooperative, Appropriate mood and affect, Normal judgment. - MSK: Normal ambulation. - Neuro: Sensation and CN II-XII grossly normal.   ASSESSMENT/PLAN:   Assessment & Plan Depression with anxiety Stable on current regimen, appropriate grief reaction given loss of son a few months ago. Type 2 diabetes mellitus without complication, without long-term current use of insulin  (HCC) Advised scheduling diabetic foot and eye exam. Episodic tension-type headache, not intractable Stable, well-controlled with Aleve.  Discussed not taking this too frequently as it can lead to medication  overuse headache.  Discussed alternating Aleve with Tylenol  as well.  Advised patient to follow-up if symptoms worsen or change. Tinnitus of left ear Continues to have symptoms.  Was recommended that she be evaluated by ENT in the past, does not appear that this has happened.  Will send new referral today.     Rea Raring, MD [facility] "

## 2024-02-11 ENCOUNTER — Encounter: Payer: Self-pay | Admitting: Family Medicine

## 2024-02-11 NOTE — Assessment & Plan Note (Signed)
 Stable on current regimen, appropriate grief reaction given loss of son a few months ago.

## 2024-02-11 NOTE — Assessment & Plan Note (Signed)
 Advised scheduling diabetic foot and eye exam.

## 2024-03-09 ENCOUNTER — Ambulatory Visit: Admitting: Orthopaedic Surgery

## 2024-03-16 ENCOUNTER — Ambulatory Visit: Payer: Self-pay | Admitting: Family Medicine

## 2024-03-23 ENCOUNTER — Ambulatory Visit: Admitting: Orthopaedic Surgery
